# Patient Record
Sex: Female | Born: 1975
Health system: Southern US, Community
[De-identification: ages and names within clinical notes are randomized; demographics above are authoritative.]

## PROBLEM LIST (undated history)

## (undated) DIAGNOSIS — D649 Anemia, unspecified: Secondary | ICD-10-CM

## (undated) DIAGNOSIS — K219 Gastro-esophageal reflux disease without esophagitis: Secondary | ICD-10-CM

## (undated) DIAGNOSIS — E785 Hyperlipidemia, unspecified: Secondary | ICD-10-CM

## (undated) DIAGNOSIS — I10 Essential (primary) hypertension: Secondary | ICD-10-CM

## (undated) DIAGNOSIS — F419 Anxiety disorder, unspecified: Secondary | ICD-10-CM

## (undated) HISTORY — DX: Essential (primary) hypertension: I10

## (undated) HISTORY — DX: Anemia, unspecified: D64.9

## (undated) HISTORY — DX: Anxiety disorder, unspecified: F41.9

## (undated) HISTORY — PX: TUBAL LIGATION: SHX77

## (undated) HISTORY — DX: Gastro-esophageal reflux disease without esophagitis: K21.9

## (undated) HISTORY — PX: APPENDECTOMY: SHX54

## (undated) HISTORY — PX: KIDNEY TRANSPLANT: SHX239

## (undated) HISTORY — PX: CHOLECYSTECTOMY: SHX55

## (undated) HISTORY — DX: Hyperlipidemia, unspecified: E78.5

## (undated) HISTORY — PX: OTHER SURGICAL HISTORY: SHX169

## (undated) HISTORY — PX: VEIN LIGATION: SHX2652

---

## 1998-07-14 ENCOUNTER — Emergency Department (HOSPITAL_COMMUNITY): Admission: EM | Admit: 1998-07-14 | Discharge: 1998-07-14 | Payer: Self-pay | Admitting: Emergency Medicine

## 1998-11-14 ENCOUNTER — Encounter: Payer: Self-pay | Admitting: Emergency Medicine

## 1998-11-14 ENCOUNTER — Emergency Department (HOSPITAL_COMMUNITY): Admission: EM | Admit: 1998-11-14 | Discharge: 1998-11-14 | Payer: Self-pay | Admitting: Emergency Medicine

## 1999-04-05 ENCOUNTER — Emergency Department (HOSPITAL_COMMUNITY): Admission: EM | Admit: 1999-04-05 | Discharge: 1999-04-05 | Payer: Self-pay

## 1999-04-29 ENCOUNTER — Emergency Department (HOSPITAL_COMMUNITY): Admission: EM | Admit: 1999-04-29 | Discharge: 1999-04-29 | Payer: Self-pay | Admitting: *Deleted

## 1999-04-29 ENCOUNTER — Encounter: Payer: Self-pay | Admitting: Emergency Medicine

## 1999-05-10 ENCOUNTER — Encounter: Payer: Self-pay | Admitting: Emergency Medicine

## 1999-05-10 ENCOUNTER — Emergency Department (HOSPITAL_COMMUNITY): Admission: EM | Admit: 1999-05-10 | Discharge: 1999-05-10 | Payer: Self-pay | Admitting: Emergency Medicine

## 1999-10-26 ENCOUNTER — Encounter: Admission: RE | Admit: 1999-10-26 | Discharge: 1999-10-26 | Payer: Self-pay | Admitting: Family Medicine

## 1999-10-26 ENCOUNTER — Encounter: Payer: Self-pay | Admitting: Family Medicine

## 2000-01-23 ENCOUNTER — Emergency Department (HOSPITAL_COMMUNITY): Admission: EM | Admit: 2000-01-23 | Discharge: 2000-01-23 | Payer: Self-pay | Admitting: Emergency Medicine

## 2000-01-23 ENCOUNTER — Encounter: Payer: Self-pay | Admitting: Emergency Medicine

## 2000-02-29 ENCOUNTER — Encounter (INDEPENDENT_AMBULATORY_CARE_PROVIDER_SITE_OTHER): Payer: Self-pay | Admitting: Specialist

## 2000-02-29 ENCOUNTER — Ambulatory Visit (HOSPITAL_COMMUNITY): Admission: RE | Admit: 2000-02-29 | Discharge: 2000-02-29 | Payer: Self-pay | Admitting: Obstetrics and Gynecology

## 2000-05-07 ENCOUNTER — Emergency Department (HOSPITAL_COMMUNITY): Admission: EM | Admit: 2000-05-07 | Discharge: 2000-05-08 | Payer: Self-pay | Admitting: Emergency Medicine

## 2000-05-16 ENCOUNTER — Emergency Department (HOSPITAL_COMMUNITY): Admission: EM | Admit: 2000-05-16 | Discharge: 2000-05-16 | Payer: Self-pay | Admitting: Emergency Medicine

## 2000-06-04 ENCOUNTER — Other Ambulatory Visit: Admission: RE | Admit: 2000-06-04 | Discharge: 2000-06-04 | Payer: Self-pay | Admitting: Obstetrics and Gynecology

## 2000-07-04 ENCOUNTER — Encounter: Payer: Self-pay | Admitting: Emergency Medicine

## 2000-07-04 ENCOUNTER — Emergency Department (HOSPITAL_COMMUNITY): Admission: EM | Admit: 2000-07-04 | Discharge: 2000-07-04 | Payer: Self-pay | Admitting: Emergency Medicine

## 2000-07-28 ENCOUNTER — Ambulatory Visit (HOSPITAL_COMMUNITY): Admission: RE | Admit: 2000-07-28 | Discharge: 2000-07-28 | Payer: Self-pay | Admitting: Obstetrics and Gynecology

## 2000-07-28 ENCOUNTER — Encounter: Payer: Self-pay | Admitting: Obstetrics and Gynecology

## 2000-08-25 ENCOUNTER — Ambulatory Visit (HOSPITAL_COMMUNITY): Admission: RE | Admit: 2000-08-25 | Discharge: 2000-08-25 | Payer: Self-pay | Admitting: Obstetrics and Gynecology

## 2000-08-25 ENCOUNTER — Encounter: Payer: Self-pay | Admitting: Obstetrics and Gynecology

## 2000-08-29 ENCOUNTER — Emergency Department (HOSPITAL_COMMUNITY): Admission: EM | Admit: 2000-08-29 | Discharge: 2000-08-29 | Payer: Self-pay | Admitting: Emergency Medicine

## 2000-10-01 ENCOUNTER — Inpatient Hospital Stay (HOSPITAL_COMMUNITY): Admission: AD | Admit: 2000-10-01 | Discharge: 2000-10-01 | Payer: Self-pay | Admitting: Obstetrics and Gynecology

## 2000-10-01 ENCOUNTER — Encounter: Payer: Self-pay | Admitting: Obstetrics and Gynecology

## 2000-10-01 ENCOUNTER — Inpatient Hospital Stay (HOSPITAL_COMMUNITY): Admission: AD | Admit: 2000-10-01 | Discharge: 2000-10-01 | Payer: Self-pay | Admitting: Obstetrics

## 2000-10-17 ENCOUNTER — Encounter: Payer: Self-pay | Admitting: Obstetrics and Gynecology

## 2000-10-17 ENCOUNTER — Ambulatory Visit (HOSPITAL_COMMUNITY): Admission: RE | Admit: 2000-10-17 | Discharge: 2000-10-17 | Payer: Self-pay | Admitting: Obstetrics and Gynecology

## 2000-10-24 ENCOUNTER — Inpatient Hospital Stay (HOSPITAL_COMMUNITY): Admission: AD | Admit: 2000-10-24 | Discharge: 2000-10-24 | Payer: Self-pay | Admitting: Obstetrics and Gynecology

## 2000-10-28 ENCOUNTER — Inpatient Hospital Stay (HOSPITAL_COMMUNITY): Admission: AD | Admit: 2000-10-28 | Discharge: 2000-10-28 | Payer: Self-pay | Admitting: Obstetrics and Gynecology

## 2000-11-02 ENCOUNTER — Inpatient Hospital Stay (HOSPITAL_COMMUNITY): Admission: AD | Admit: 2000-11-02 | Discharge: 2000-11-07 | Payer: Self-pay | Admitting: Obstetrics and Gynecology

## 2000-11-02 ENCOUNTER — Encounter (INDEPENDENT_AMBULATORY_CARE_PROVIDER_SITE_OTHER): Payer: Self-pay

## 2000-11-03 ENCOUNTER — Encounter: Payer: Self-pay | Admitting: Obstetrics and Gynecology

## 2001-02-07 ENCOUNTER — Emergency Department (HOSPITAL_COMMUNITY): Admission: EM | Admit: 2001-02-07 | Discharge: 2001-02-07 | Payer: Self-pay | Admitting: Emergency Medicine

## 2001-02-25 ENCOUNTER — Encounter: Payer: Self-pay | Admitting: Gastroenterology

## 2001-02-25 ENCOUNTER — Ambulatory Visit (HOSPITAL_COMMUNITY): Admission: RE | Admit: 2001-02-25 | Discharge: 2001-02-25 | Payer: Self-pay | Admitting: Gastroenterology

## 2001-03-24 ENCOUNTER — Observation Stay (HOSPITAL_COMMUNITY): Admission: RE | Admit: 2001-03-24 | Discharge: 2001-03-24 | Payer: Self-pay | Admitting: General Surgery

## 2001-03-24 ENCOUNTER — Encounter: Payer: Self-pay | Admitting: General Surgery

## 2001-03-24 ENCOUNTER — Encounter (INDEPENDENT_AMBULATORY_CARE_PROVIDER_SITE_OTHER): Payer: Self-pay | Admitting: Specialist

## 2001-08-22 ENCOUNTER — Emergency Department (HOSPITAL_COMMUNITY): Admission: EM | Admit: 2001-08-22 | Discharge: 2001-08-22 | Payer: Self-pay | Admitting: Emergency Medicine

## 2001-08-22 ENCOUNTER — Encounter: Payer: Self-pay | Admitting: Emergency Medicine

## 2001-08-28 ENCOUNTER — Emergency Department (HOSPITAL_COMMUNITY): Admission: EM | Admit: 2001-08-28 | Discharge: 2001-08-28 | Payer: Self-pay | Admitting: Emergency Medicine

## 2001-09-04 ENCOUNTER — Ambulatory Visit (HOSPITAL_COMMUNITY): Admission: RE | Admit: 2001-09-04 | Discharge: 2001-09-04 | Payer: Self-pay | Admitting: Specialist

## 2001-09-04 ENCOUNTER — Encounter: Payer: Self-pay | Admitting: Specialist

## 2001-10-26 ENCOUNTER — Emergency Department (HOSPITAL_COMMUNITY): Admission: EM | Admit: 2001-10-26 | Discharge: 2001-10-26 | Payer: Self-pay | Admitting: Emergency Medicine

## 2001-10-26 ENCOUNTER — Encounter: Payer: Self-pay | Admitting: Emergency Medicine

## 2002-05-17 ENCOUNTER — Emergency Department (HOSPITAL_COMMUNITY): Admission: EM | Admit: 2002-05-17 | Discharge: 2002-05-17 | Payer: Self-pay

## 2002-05-21 ENCOUNTER — Ambulatory Visit (HOSPITAL_COMMUNITY): Admission: RE | Admit: 2002-05-21 | Discharge: 2002-05-21 | Payer: Self-pay | Admitting: Obstetrics and Gynecology

## 2002-05-21 ENCOUNTER — Encounter: Payer: Self-pay | Admitting: Obstetrics and Gynecology

## 2003-04-06 ENCOUNTER — Emergency Department (HOSPITAL_COMMUNITY): Admission: EM | Admit: 2003-04-06 | Discharge: 2003-04-06 | Payer: Self-pay | Admitting: Emergency Medicine

## 2003-12-17 ENCOUNTER — Emergency Department (HOSPITAL_COMMUNITY): Admission: EM | Admit: 2003-12-17 | Discharge: 2003-12-18 | Payer: Self-pay | Admitting: Emergency Medicine

## 2003-12-21 ENCOUNTER — Ambulatory Visit (HOSPITAL_COMMUNITY): Admission: RE | Admit: 2003-12-21 | Discharge: 2003-12-21 | Payer: Self-pay | Admitting: Obstetrics and Gynecology

## 2004-02-01 ENCOUNTER — Emergency Department (HOSPITAL_COMMUNITY): Admission: EM | Admit: 2004-02-01 | Discharge: 2004-02-01 | Payer: Self-pay | Admitting: Emergency Medicine

## 2004-03-05 ENCOUNTER — Encounter: Admission: RE | Admit: 2004-03-05 | Discharge: 2004-03-05 | Payer: Self-pay | Admitting: *Deleted

## 2004-08-17 ENCOUNTER — Emergency Department (HOSPITAL_COMMUNITY): Admission: EM | Admit: 2004-08-17 | Discharge: 2004-08-17 | Payer: Self-pay | Admitting: Emergency Medicine

## 2004-09-14 ENCOUNTER — Encounter: Admission: RE | Admit: 2004-09-14 | Discharge: 2004-12-13 | Payer: Self-pay | Admitting: Orthopedic Surgery

## 2005-12-04 ENCOUNTER — Emergency Department (HOSPITAL_COMMUNITY): Admission: EM | Admit: 2005-12-04 | Discharge: 2005-12-04 | Payer: Self-pay | Admitting: Emergency Medicine

## 2006-01-09 ENCOUNTER — Emergency Department (HOSPITAL_COMMUNITY): Admission: EM | Admit: 2006-01-09 | Discharge: 2006-01-09 | Payer: Self-pay | Admitting: Emergency Medicine

## 2006-03-14 ENCOUNTER — Emergency Department (HOSPITAL_COMMUNITY): Admission: EM | Admit: 2006-03-14 | Discharge: 2006-03-14 | Payer: Self-pay | Admitting: Emergency Medicine

## 2006-03-19 ENCOUNTER — Inpatient Hospital Stay (HOSPITAL_COMMUNITY): Admission: AD | Admit: 2006-03-19 | Discharge: 2006-03-20 | Payer: Self-pay | Admitting: Obstetrics and Gynecology

## 2006-04-16 ENCOUNTER — Inpatient Hospital Stay (HOSPITAL_COMMUNITY): Admission: AD | Admit: 2006-04-16 | Discharge: 2006-04-19 | Payer: Self-pay | Admitting: Obstetrics and Gynecology

## 2006-09-08 ENCOUNTER — Encounter: Admission: RE | Admit: 2006-09-08 | Discharge: 2006-09-08 | Payer: Self-pay | Admitting: Nephrology

## 2006-09-20 ENCOUNTER — Encounter (INDEPENDENT_AMBULATORY_CARE_PROVIDER_SITE_OTHER): Payer: Self-pay | Admitting: *Deleted

## 2006-09-20 ENCOUNTER — Encounter: Payer: Self-pay | Admitting: Emergency Medicine

## 2006-09-20 ENCOUNTER — Inpatient Hospital Stay (HOSPITAL_COMMUNITY): Admission: AD | Admit: 2006-09-20 | Discharge: 2006-09-20 | Payer: Self-pay | Admitting: Obstetrics and Gynecology

## 2006-09-21 ENCOUNTER — Inpatient Hospital Stay (HOSPITAL_COMMUNITY): Admission: RE | Admit: 2006-09-21 | Discharge: 2006-09-22 | Payer: Self-pay | Admitting: Surgery

## 2006-11-18 ENCOUNTER — Ambulatory Visit (HOSPITAL_COMMUNITY): Admission: RE | Admit: 2006-11-18 | Discharge: 2006-11-18 | Payer: Self-pay | Admitting: Family Medicine

## 2006-11-19 ENCOUNTER — Ambulatory Visit: Payer: Self-pay | Admitting: Gynecology

## 2006-11-28 ENCOUNTER — Ambulatory Visit (HOSPITAL_COMMUNITY): Admission: RE | Admit: 2006-11-28 | Discharge: 2006-11-28 | Payer: Self-pay | Admitting: Gynecology

## 2006-11-28 ENCOUNTER — Ambulatory Visit: Payer: Self-pay | Admitting: Gynecology

## 2006-11-28 ENCOUNTER — Encounter (INDEPENDENT_AMBULATORY_CARE_PROVIDER_SITE_OTHER): Payer: Self-pay | Admitting: Specialist

## 2007-02-26 ENCOUNTER — Ambulatory Visit: Payer: Self-pay | Admitting: *Deleted

## 2007-03-06 ENCOUNTER — Ambulatory Visit: Payer: Self-pay | Admitting: *Deleted

## 2007-03-06 ENCOUNTER — Ambulatory Visit (HOSPITAL_COMMUNITY): Admission: RE | Admit: 2007-03-06 | Discharge: 2007-03-06 | Payer: Self-pay | Admitting: *Deleted

## 2007-03-08 ENCOUNTER — Emergency Department (HOSPITAL_COMMUNITY): Admission: EM | Admit: 2007-03-08 | Discharge: 2007-03-08 | Payer: Self-pay | Admitting: Emergency Medicine

## 2007-03-19 ENCOUNTER — Ambulatory Visit: Payer: Self-pay | Admitting: *Deleted

## 2007-04-09 ENCOUNTER — Ambulatory Visit: Payer: Self-pay | Admitting: *Deleted

## 2007-04-23 ENCOUNTER — Emergency Department (HOSPITAL_COMMUNITY): Admission: EM | Admit: 2007-04-23 | Discharge: 2007-04-23 | Payer: Self-pay | Admitting: Emergency Medicine

## 2007-05-08 ENCOUNTER — Ambulatory Visit (HOSPITAL_COMMUNITY): Admission: RE | Admit: 2007-05-08 | Discharge: 2007-05-08 | Payer: Self-pay | Admitting: General Practice

## 2007-06-26 ENCOUNTER — Ambulatory Visit (HOSPITAL_COMMUNITY): Admission: RE | Admit: 2007-06-26 | Discharge: 2007-06-26 | Payer: Self-pay | Admitting: *Deleted

## 2007-06-26 ENCOUNTER — Ambulatory Visit: Payer: Self-pay | Admitting: *Deleted

## 2007-07-06 ENCOUNTER — Emergency Department (HOSPITAL_COMMUNITY): Admission: EM | Admit: 2007-07-06 | Discharge: 2007-07-06 | Payer: Self-pay | Admitting: Emergency Medicine

## 2007-07-30 ENCOUNTER — Ambulatory Visit: Payer: Self-pay | Admitting: *Deleted

## 2007-08-07 ENCOUNTER — Ambulatory Visit: Payer: Self-pay | Admitting: Vascular Surgery

## 2007-08-07 ENCOUNTER — Ambulatory Visit (HOSPITAL_COMMUNITY): Admission: RE | Admit: 2007-08-07 | Discharge: 2007-08-07 | Payer: Self-pay | Admitting: Vascular Surgery

## 2007-09-01 ENCOUNTER — Emergency Department (HOSPITAL_COMMUNITY): Admission: EM | Admit: 2007-09-01 | Discharge: 2007-09-02 | Payer: Self-pay | Admitting: Emergency Medicine

## 2007-10-01 ENCOUNTER — Ambulatory Visit: Payer: Self-pay | Admitting: *Deleted

## 2007-10-05 ENCOUNTER — Encounter: Admission: RE | Admit: 2007-10-05 | Discharge: 2007-10-05 | Payer: Self-pay | Admitting: Family Medicine

## 2007-10-06 ENCOUNTER — Encounter (INDEPENDENT_AMBULATORY_CARE_PROVIDER_SITE_OTHER): Payer: Self-pay | Admitting: *Deleted

## 2007-10-06 ENCOUNTER — Ambulatory Visit: Payer: Self-pay | Admitting: *Deleted

## 2007-10-06 ENCOUNTER — Inpatient Hospital Stay (HOSPITAL_COMMUNITY): Admission: AD | Admit: 2007-10-06 | Discharge: 2007-10-10 | Payer: Self-pay | Admitting: *Deleted

## 2007-10-14 ENCOUNTER — Ambulatory Visit: Payer: Self-pay | Admitting: Vascular Surgery

## 2007-10-23 ENCOUNTER — Ambulatory Visit: Payer: Self-pay | Admitting: Vascular Surgery

## 2007-11-12 ENCOUNTER — Ambulatory Visit: Payer: Self-pay | Admitting: *Deleted

## 2008-02-24 ENCOUNTER — Emergency Department (HOSPITAL_COMMUNITY): Admission: EM | Admit: 2008-02-24 | Discharge: 2008-02-24 | Payer: Self-pay | Admitting: Emergency Medicine

## 2008-03-02 ENCOUNTER — Ambulatory Visit (HOSPITAL_COMMUNITY): Admission: RE | Admit: 2008-03-02 | Discharge: 2008-03-02 | Payer: Self-pay | Admitting: Surgery

## 2008-03-02 ENCOUNTER — Ambulatory Visit: Payer: Self-pay | Admitting: Surgery

## 2008-03-07 ENCOUNTER — Emergency Department (HOSPITAL_COMMUNITY): Admission: EM | Admit: 2008-03-07 | Discharge: 2008-03-07 | Payer: Self-pay | Admitting: Emergency Medicine

## 2008-04-06 ENCOUNTER — Inpatient Hospital Stay (HOSPITAL_COMMUNITY): Admission: EM | Admit: 2008-04-06 | Discharge: 2008-04-09 | Payer: Self-pay | Admitting: Emergency Medicine

## 2008-04-11 ENCOUNTER — Ambulatory Visit (HOSPITAL_COMMUNITY): Admission: RE | Admit: 2008-04-11 | Discharge: 2008-04-11 | Payer: Self-pay | Admitting: Nephrology

## 2008-04-28 ENCOUNTER — Ambulatory Visit: Payer: Self-pay | Admitting: *Deleted

## 2008-05-12 ENCOUNTER — Ambulatory Visit: Payer: Self-pay | Admitting: *Deleted

## 2008-05-14 ENCOUNTER — Emergency Department (HOSPITAL_COMMUNITY): Admission: EM | Admit: 2008-05-14 | Discharge: 2008-05-14 | Payer: Self-pay | Admitting: Emergency Medicine

## 2008-05-16 ENCOUNTER — Ambulatory Visit (HOSPITAL_COMMUNITY): Admission: RE | Admit: 2008-05-16 | Discharge: 2008-05-16 | Payer: Self-pay | Admitting: Nephrology

## 2008-05-19 ENCOUNTER — Encounter: Payer: Self-pay | Admitting: *Deleted

## 2008-05-19 ENCOUNTER — Ambulatory Visit (HOSPITAL_COMMUNITY): Admission: RE | Admit: 2008-05-19 | Discharge: 2008-05-19 | Payer: Self-pay | Admitting: Vascular Surgery

## 2008-05-19 ENCOUNTER — Ambulatory Visit: Payer: Self-pay | Admitting: Vascular Surgery

## 2008-05-24 ENCOUNTER — Emergency Department (HOSPITAL_COMMUNITY): Admission: EM | Admit: 2008-05-24 | Discharge: 2008-05-25 | Payer: Self-pay | Admitting: Emergency Medicine

## 2008-05-27 ENCOUNTER — Ambulatory Visit: Payer: Self-pay | Admitting: Internal Medicine

## 2008-05-31 ENCOUNTER — Ambulatory Visit (HOSPITAL_COMMUNITY): Admission: RE | Admit: 2008-05-31 | Discharge: 2008-05-31 | Payer: Self-pay | Admitting: *Deleted

## 2008-05-31 ENCOUNTER — Telehealth (INDEPENDENT_AMBULATORY_CARE_PROVIDER_SITE_OTHER): Payer: Self-pay | Admitting: *Deleted

## 2008-06-05 ENCOUNTER — Emergency Department (HOSPITAL_COMMUNITY): Admission: EM | Admit: 2008-06-05 | Discharge: 2008-06-06 | Payer: Self-pay | Admitting: Emergency Medicine

## 2008-07-04 ENCOUNTER — Ambulatory Visit: Payer: Self-pay | Admitting: *Deleted

## 2008-07-28 ENCOUNTER — Ambulatory Visit: Payer: Self-pay | Admitting: Vascular Surgery

## 2008-07-28 ENCOUNTER — Inpatient Hospital Stay (HOSPITAL_COMMUNITY): Admission: EM | Admit: 2008-07-28 | Discharge: 2008-07-30 | Payer: Self-pay | Admitting: Emergency Medicine

## 2008-10-25 ENCOUNTER — Ambulatory Visit: Payer: Self-pay | Admitting: Vascular Surgery

## 2008-10-25 ENCOUNTER — Ambulatory Visit (HOSPITAL_COMMUNITY): Admission: RE | Admit: 2008-10-25 | Discharge: 2008-10-25 | Payer: Self-pay | Admitting: Vascular Surgery

## 2008-10-25 ENCOUNTER — Other Ambulatory Visit: Payer: Self-pay | Admitting: Vascular Surgery

## 2008-10-27 ENCOUNTER — Ambulatory Visit (HOSPITAL_COMMUNITY): Admission: RE | Admit: 2008-10-27 | Discharge: 2008-10-27 | Payer: Self-pay | Admitting: Vascular Surgery

## 2008-11-02 ENCOUNTER — Inpatient Hospital Stay (HOSPITAL_COMMUNITY): Admission: AD | Admit: 2008-11-02 | Discharge: 2008-11-02 | Payer: Self-pay | Admitting: Nephrology

## 2008-11-28 ENCOUNTER — Inpatient Hospital Stay (HOSPITAL_COMMUNITY): Admission: EM | Admit: 2008-11-28 | Discharge: 2008-12-02 | Payer: Self-pay | Admitting: Emergency Medicine

## 2008-11-28 ENCOUNTER — Ambulatory Visit: Payer: Self-pay | Admitting: Cardiology

## 2008-11-29 ENCOUNTER — Ambulatory Visit: Payer: Self-pay | Admitting: Vascular Surgery

## 2008-11-30 ENCOUNTER — Encounter (INDEPENDENT_AMBULATORY_CARE_PROVIDER_SITE_OTHER): Payer: Self-pay | Admitting: Internal Medicine

## 2008-12-02 ENCOUNTER — Encounter: Payer: Self-pay | Admitting: Cardiology

## 2009-03-24 ENCOUNTER — Emergency Department (HOSPITAL_COMMUNITY): Admission: EM | Admit: 2009-03-24 | Discharge: 2009-03-24 | Payer: Self-pay | Admitting: Emergency Medicine

## 2009-04-01 ENCOUNTER — Emergency Department (HOSPITAL_COMMUNITY): Admission: EM | Admit: 2009-04-01 | Discharge: 2009-04-01 | Payer: Self-pay | Admitting: Emergency Medicine

## 2009-10-08 IMAGING — CT CT ABDOMEN W/O CM
2 of 4 series · 17 of 46 positions shown, 19 images · IV contrast (agent unspecified)
Comparison: 09/20/06

CLINICAL DATA: 31 year-old with left flank pain.  
 ABDOMEN CT WITHOUT CONTRAST:
TECHNIQUE: Multidetector CT imaging of the abdomen was performed following the standard protocol without IV contrast.
TECHNIQUE: Multidetector CT imaging of the pelvis was performed following the standard protocol without IV contrast.

[Series 2: 130 stone 5.0 b40f st · axial · 0.61mm/px · z∈[+1236,+1556]mm · 14 of 70 slices shown, 16 images]
[im 3/70  soft-tissue]
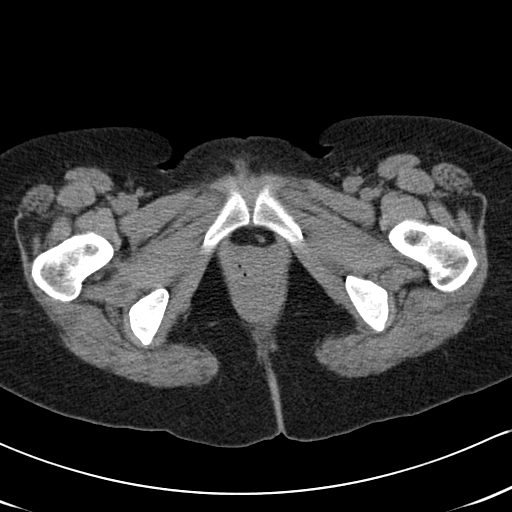
[im 3/70  bone]
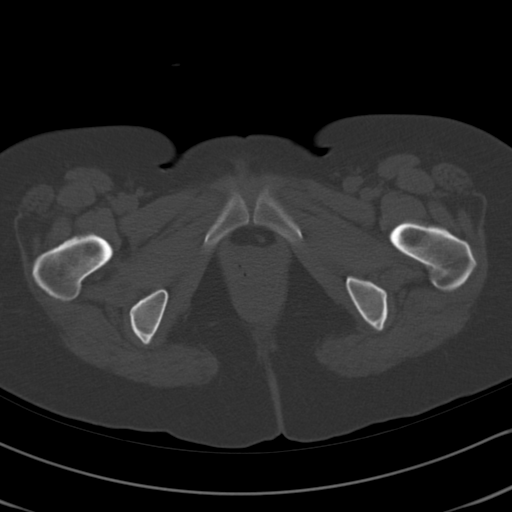
[im 8/70  soft-tissue]
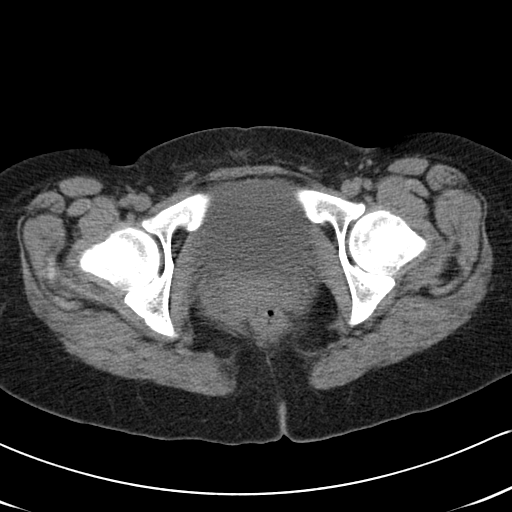
[im 13/70  soft-tissue]
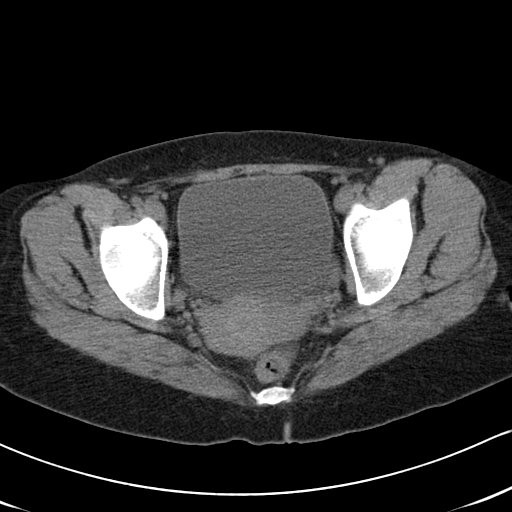
[im 18/70  soft-tissue]
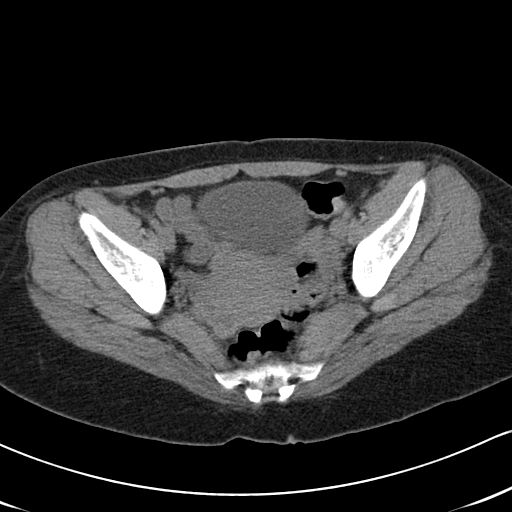
[im 24/70  soft-tissue]
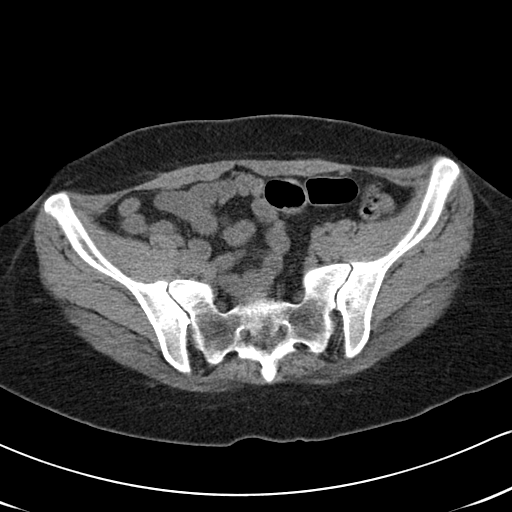
[im 29/70  soft-tissue]
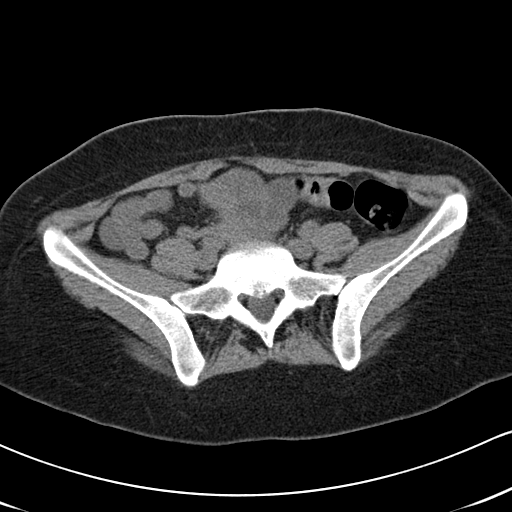
[im 34/70  soft-tissue]
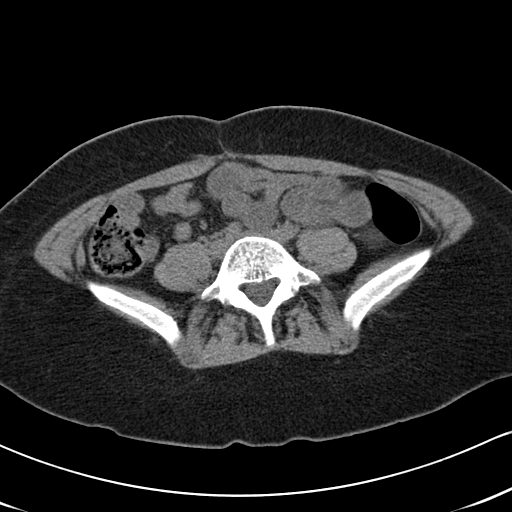
[im 36/70  soft-tissue]
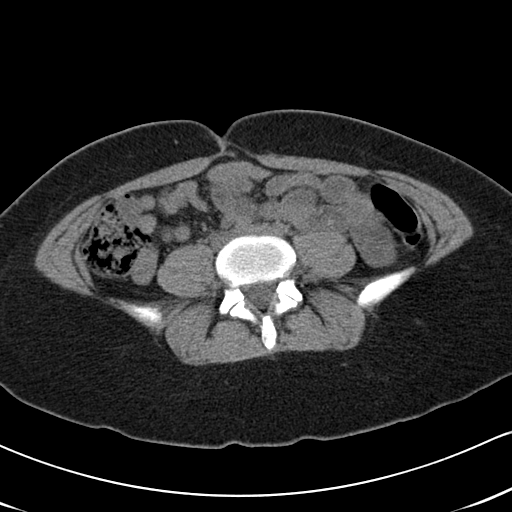
[im 41/70  soft-tissue]
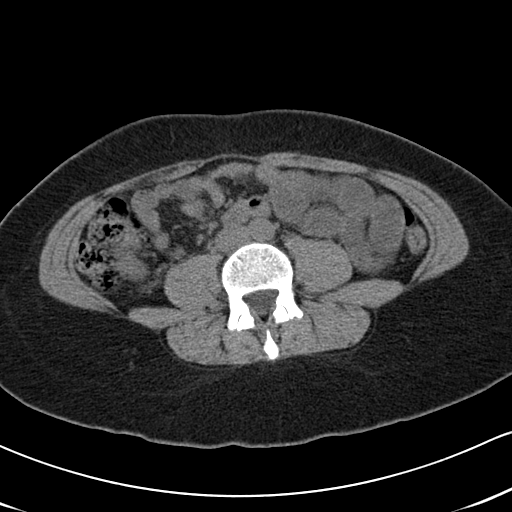
[im 41/70  bone]
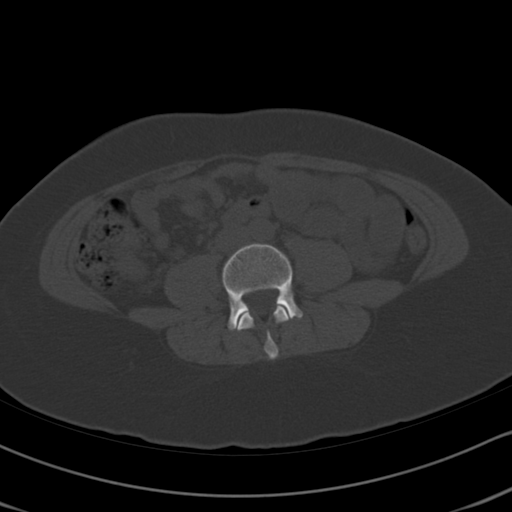
[im 47/70  soft-tissue]
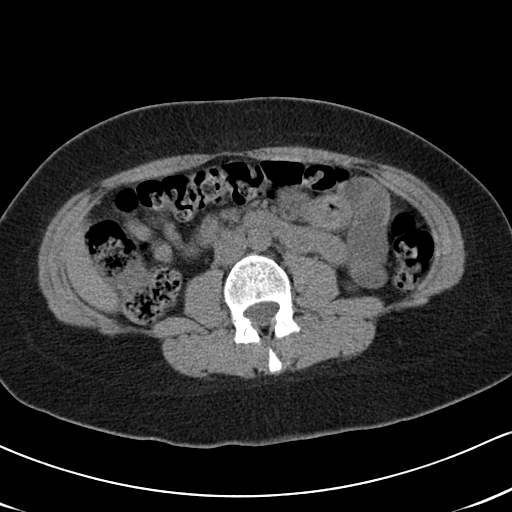
[im 52/70  soft-tissue]
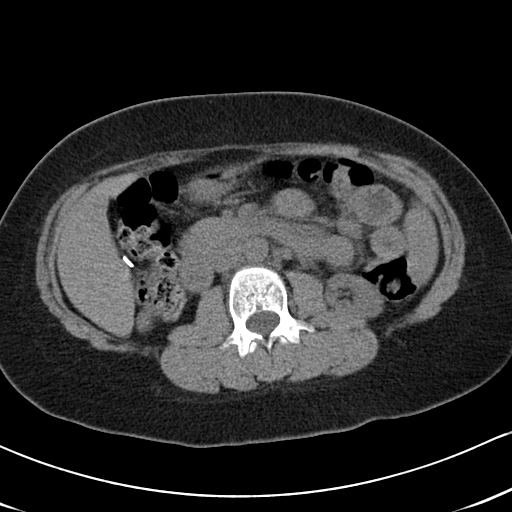
[im 57/70  soft-tissue]
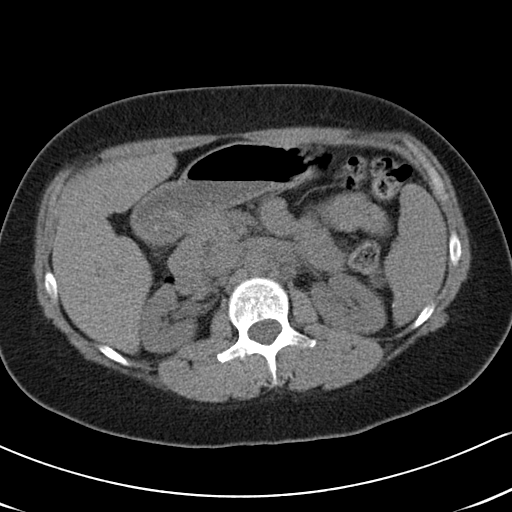
[im 62/70  soft-tissue]
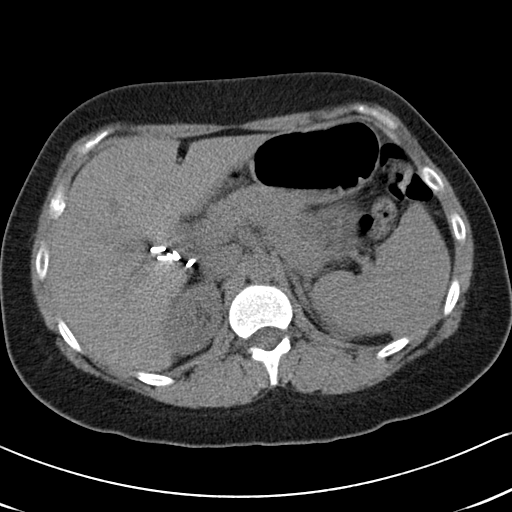
[im 67/70  soft-tissue]
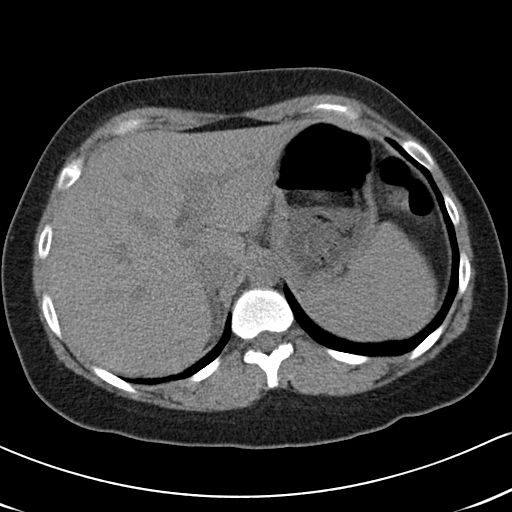

[Series 602: <mpr thick range> · coronal · 0.72mm/px · 3 of 57 slices shown]
[im 19/57  soft-tissue]
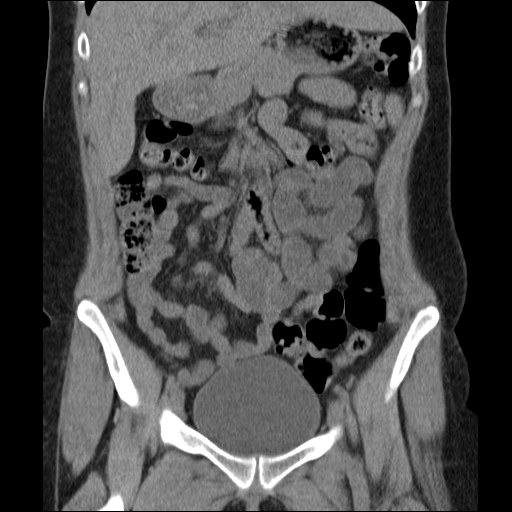
[im 25/57  soft-tissue]
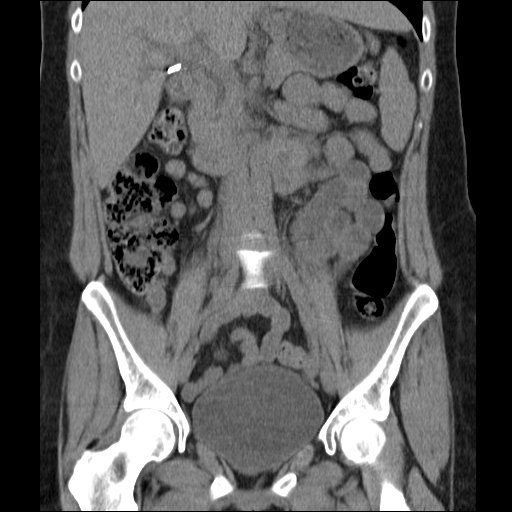
[im 32/57  soft-tissue]
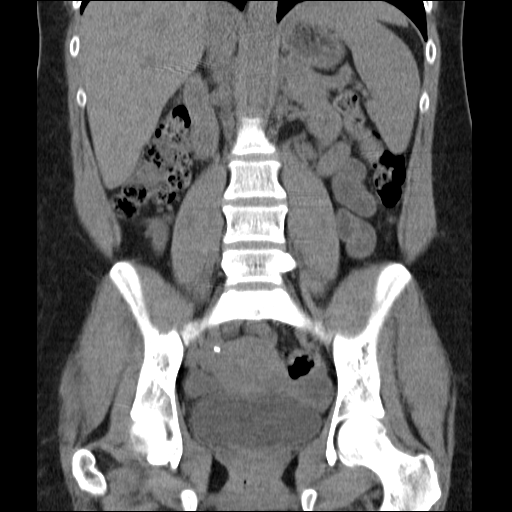

[17 of 46 positions shown; findings below may reference images not displayed]

FINDINGS: The lung bases are clear.
 The visualized unenhanced appearance of the liver and spleen are unremarkable.  Patient has had a cholecystectomy.  The pancreas is grossly normal.  The adrenal glands and kidneys demonstrate no significant findings.  Both kidneys are small which is a stable finding.  No renal calculi or renal obstructive process.  
 The aorta is normal in caliber.
 The stomach, duodenum, small bowel and colon are grossly normal but the study is limited without oral contrast.
 Small scattered mesenteric and retroperitoneal lymph nodes but no adenopathy.  
 No significant bony findings.
IMPRESSION: 1.  Small kidneys, stable.
 2.  Status post cholecystectomy.
 3.  No renal calculi or evidence for renal obstructive process.  
 4.  No acute abdominal findings, masses or adenopathy.  Small scattered mesenteric and retroperitoneal lymph nodes.
 PELVIS CT WITHOUT CONTRAST:
FINDINGS: Surgical clips are noted from a previous appendectomy.  There are also tubal ligation clips.  There is a small cyst associated with the left ovary.  The right ovary appears normal.  The rectum, sigmoid colon and visualized small bowel loops are unremarkable.  No ureteral calculi or bladder calculi.  No inguinal masses or adenopathy.  The bony pelvis is intact.
IMPRESSION: 1.  No acute pelvic findings, masses or adenopathy. 
 2.  Status post appendectomy.  
 3.  Simple-appearing cyst associated with the left ovary.

## 2009-10-13 ENCOUNTER — Emergency Department (HOSPITAL_COMMUNITY): Admission: EM | Admit: 2009-10-13 | Discharge: 2009-10-14 | Payer: Self-pay | Admitting: Emergency Medicine

## 2009-10-18 ENCOUNTER — Inpatient Hospital Stay (HOSPITAL_COMMUNITY): Admission: EM | Admit: 2009-10-18 | Discharge: 2009-10-25 | Payer: Self-pay | Admitting: Emergency Medicine

## 2009-10-19 ENCOUNTER — Ambulatory Visit: Payer: Self-pay | Admitting: Internal Medicine

## 2009-10-30 ENCOUNTER — Ambulatory Visit: Payer: Self-pay | Admitting: Infectious Disease

## 2009-10-30 DIAGNOSIS — I10 Essential (primary) hypertension: Secondary | ICD-10-CM | POA: Insufficient documentation

## 2009-10-30 DIAGNOSIS — R7881 Bacteremia: Secondary | ICD-10-CM | POA: Insufficient documentation

## 2009-10-30 DIAGNOSIS — L03211 Cellulitis of face: Secondary | ICD-10-CM

## 2009-10-30 DIAGNOSIS — Z940 Kidney transplant status: Secondary | ICD-10-CM | POA: Insufficient documentation

## 2009-10-30 DIAGNOSIS — A4102 Sepsis due to Methicillin resistant Staphylococcus aureus: Secondary | ICD-10-CM | POA: Insufficient documentation

## 2009-10-30 DIAGNOSIS — L0201 Cutaneous abscess of face: Secondary | ICD-10-CM | POA: Insufficient documentation

## 2009-10-30 DIAGNOSIS — A4901 Methicillin susceptible Staphylococcus aureus infection, unspecified site: Secondary | ICD-10-CM | POA: Insufficient documentation

## 2009-10-30 LAB — CONVERTED CEMR LAB
BUN: 32 mg/dL — ABNORMAL HIGH (ref 6–23)
Basophils Absolute: 0 10*3/uL (ref 0.0–0.1)
Basophils Relative: 0 % (ref 0–1)
CO2: 19 meq/L (ref 19–32)
CRP: 0.3 mg/dL (ref <0.6)
Calcium: 9.9 mg/dL (ref 8.4–10.5)
Chloride: 105 meq/L (ref 96–112)
Creatinine, Ser: 1.6 mg/dL — ABNORMAL HIGH (ref 0.40–1.20)
Eosinophils Absolute: 0.1 10*3/uL (ref 0.0–0.7)
Eosinophils Relative: 1 % (ref 0–5)
Glucose, Bld: 84 mg/dL (ref 70–99)
HCT: 46.4 % — ABNORMAL HIGH (ref 36.0–46.0)
Hemoglobin: 15.4 g/dL — ABNORMAL HIGH (ref 12.0–15.0)
Lymphocytes Relative: 10 % — ABNORMAL LOW (ref 12–46)
Lymphs Abs: 1 10*3/uL (ref 0.7–4.0)
MCHC: 33.2 g/dL (ref 30.0–36.0)
MCV: 85.8 fL (ref >=78.0)
Monocytes Absolute: 0.5 10*3/uL (ref 0.1–1.0)
Monocytes Relative: 5 % (ref 3–12)
Neutro Abs: 8.4 10*3/uL — ABNORMAL HIGH (ref 1.7–7.7)
Neutrophils Relative %: 84 % — ABNORMAL HIGH (ref 43–77)
Platelets: 244 10*3/uL (ref 150–400)
Potassium: 4.6 meq/L (ref 3.5–5.3)
RBC: 5.41 M/uL — ABNORMAL HIGH (ref 3.87–5.11)
RDW: 12.9 % (ref 11.5–15.5)
Sed Rate: 10 mm/hr (ref 0–22)
Sodium: 138 meq/L (ref 135–145)
WBC: 10 10*3/uL (ref 4.0–10.5)

## 2009-11-05 ENCOUNTER — Inpatient Hospital Stay (HOSPITAL_COMMUNITY): Admission: AD | Admit: 2009-11-05 | Discharge: 2009-11-07 | Payer: Self-pay | Admitting: Internal Medicine

## 2009-11-07 ENCOUNTER — Telehealth: Payer: Self-pay | Admitting: Infectious Disease

## 2009-11-07 ENCOUNTER — Ambulatory Visit: Payer: Self-pay | Admitting: Infectious Disease

## 2009-11-09 ENCOUNTER — Encounter: Payer: Self-pay | Admitting: Infectious Disease

## 2009-11-09 ENCOUNTER — Telehealth: Payer: Self-pay | Admitting: Infectious Diseases

## 2009-11-13 ENCOUNTER — Telehealth: Payer: Self-pay | Admitting: Infectious Disease

## 2009-11-17 ENCOUNTER — Encounter: Payer: Self-pay | Admitting: Infectious Disease

## 2009-11-20 ENCOUNTER — Ambulatory Visit: Payer: Self-pay | Admitting: Infectious Disease

## 2009-11-20 DIAGNOSIS — A4902 Methicillin resistant Staphylococcus aureus infection, unspecified site: Secondary | ICD-10-CM | POA: Insufficient documentation

## 2009-11-21 ENCOUNTER — Encounter: Payer: Self-pay | Admitting: Infectious Disease

## 2009-11-30 ENCOUNTER — Encounter: Payer: Self-pay | Admitting: Infectious Disease

## 2009-12-04 ENCOUNTER — Telehealth: Payer: Self-pay | Admitting: Infectious Disease

## 2009-12-08 ENCOUNTER — Encounter: Payer: Self-pay | Admitting: Infectious Disease

## 2009-12-11 ENCOUNTER — Encounter: Payer: Self-pay | Admitting: Infectious Disease

## 2009-12-18 ENCOUNTER — Ambulatory Visit: Payer: Self-pay | Admitting: Infectious Disease

## 2009-12-20 ENCOUNTER — Encounter: Payer: Self-pay | Admitting: Infectious Disease

## 2009-12-21 ENCOUNTER — Inpatient Hospital Stay (HOSPITAL_COMMUNITY): Admission: AD | Admit: 2009-12-21 | Discharge: 2009-12-23 | Payer: Self-pay | Admitting: Internal Medicine

## 2009-12-21 ENCOUNTER — Encounter (INDEPENDENT_AMBULATORY_CARE_PROVIDER_SITE_OTHER): Payer: Self-pay | Admitting: Dermatology

## 2009-12-21 ENCOUNTER — Ambulatory Visit: Payer: Self-pay | Admitting: Internal Medicine

## 2009-12-22 ENCOUNTER — Ambulatory Visit: Payer: Self-pay | Admitting: Infectious Diseases

## 2009-12-23 ENCOUNTER — Encounter: Payer: Self-pay | Admitting: Internal Medicine

## 2009-12-28 ENCOUNTER — Encounter: Payer: Self-pay | Admitting: Infectious Disease

## 2009-12-29 ENCOUNTER — Telehealth: Payer: Self-pay | Admitting: Infectious Disease

## 2009-12-29 ENCOUNTER — Encounter: Payer: Self-pay | Admitting: Infectious Disease

## 2010-01-02 ENCOUNTER — Telehealth: Payer: Self-pay | Admitting: Infectious Disease

## 2010-01-04 ENCOUNTER — Ambulatory Visit: Payer: Self-pay | Admitting: Infectious Disease

## 2010-01-11 ENCOUNTER — Encounter: Payer: Self-pay | Admitting: Infectious Disease

## 2010-01-15 ENCOUNTER — Encounter: Payer: Self-pay | Admitting: Infectious Disease

## 2010-02-15 ENCOUNTER — Encounter: Payer: Self-pay | Admitting: Infectious Disease

## 2010-03-07 ENCOUNTER — Encounter: Payer: Self-pay | Admitting: Infectious Disease

## 2010-04-04 ENCOUNTER — Telehealth: Payer: Self-pay | Admitting: Infectious Disease

## 2010-04-17 ENCOUNTER — Encounter: Payer: Self-pay | Admitting: Infectious Disease

## 2010-05-29 ENCOUNTER — Emergency Department: Payer: Self-pay | Admitting: Emergency Medicine

## 2010-06-27 ENCOUNTER — Telehealth: Payer: Self-pay | Admitting: Internal Medicine

## 2010-07-25 ENCOUNTER — Emergency Department: Payer: Self-pay | Admitting: Emergency Medicine

## 2010-09-02 ENCOUNTER — Encounter: Payer: Self-pay | Admitting: Nephrology

## 2010-09-03 ENCOUNTER — Encounter: Payer: Self-pay | Admitting: Nephrology

## 2010-09-13 NOTE — Consult Note (Signed)
Summary: [facility]  [facility]   Imported By: Florinda Marker 12/13/2009 15:57:27  _____________________________________________________________________  External Attachment:    Type:   Image     Comment:   External Document

## 2010-09-13 NOTE — Miscellaneous (Signed)
Summary: Hospital Admission H&P  INTERNAL MEDICINE ADMISSION HISTORY AND PHYSICAL  PCP: Acey Lav, MD  CC: facial abscess  HPI:  35 year old lady with hx of ESRD and kidney transplant last spring who was [facility] with a left facial abscess and underwent I and D by Dr. Arn Medal from ENT March 2011. Cultures grew MSSA (R to clindamcyin though) and she was transitioned from inital daptomycin to ancef adn then sent hom on keflex 500mg  qid. Her facial edema has apparently markedly improved compared to when she was admitted to the hospital and seen by Dr. Arn Medal and my partner Dr. Orvan Falconer. She failed to improve on oral antibiotics and was readmitted by me (moonlighting for [employer]) and underwent repeat I an D by ENT and was sent home on ancef, but this time actually grew MRSA from deep cultures S to vanco, tetra, TMP clinda (the MSSA was clinda R , tetra S, tmp s, ox S) and was switched to daptomycin. She improved on the daptomycin and actually received greater than 3 weeks of IV daptomycin at 4mg /kg. However there remained an area of exquisite tenderness on the nasal aspect of her face that is also fluctuant. She had called on the 25th when her cubicin was runnign out and was concerned about the persistent area of fluctuance. I called her back (contrary to what is documented in the notes from [facility] subsequent to then when she apparently claimed she was not called back) and informed her that if she still had an area of fluctuance she would need repeat I and D and that protracted daptomycin would not solve the problem. I put her on oral bactri at two tablets twice daily. I have also refilled her narcotic script. Today she had been urgently scheduled to see Dr. Sampson Goon in the afternoon but came in the morning and I worked her in as a different patient had cancelled. She continues to have  significant pain in her face and orbit and is taking 1.5 percocet 10s three times a day now. She  denies subjective fevers but is still suffering.  She stats taht Dr. Pollyann Kennedy performed the most recent surgery.   **Above is taken from office note of Dr Daiva Eves from 12-18-2009  Patient is here today for admission. Other than facial pain at left cheek and left side of nose, she has no complaints, specifically no fevers or chills.    ALLERGIES: vancomycin [itching, erythema] metoclopramide [anxiety]  PAST MEDICAL HISTORY: Renal transplant, 1 year ago with donor kidney from mother; transplant at [facility]; nephrologist in [place] is Dr. Eliott Nine HTN MSSA and MRSA facial absess [see HPI]   MEDICATIONS:   Current Meds:  MYFORTIC 360 MG TBEC (MYCOPHENOLATE SODIUM) 2 tablets two times a day PROGRAF 1 MG CAPS (TACROLIMUS) 3 capsules in the am and 3 capsules in the pm PREDNISONE 10 MG TABS (PREDNISONE) 2 tabs each morning AMLODIPINE BESYLATE 5 MG TABS (AMLODIPINE BESYLATE)  ALPRAZOLAM 0.5 MG TABS (ALPRAZOLAM) Take 1 tablet by mouth twice a day PERCOCET 10-325 MG TABS (OXYCODONE-ACETAMINOPHEN) take one and one and half tablets three times a day BACTRIM DS 800-160 MG TABS (SULFAMETHOXAZOLE-TRIMETHOPRIM) Take 1 tablet by mouth two times a day until seen by Dr. Daiva Eves ZOLPIDEM TARTRATE 10 MG TABS (ZOLPIDEM TARTRATE) Take 1 tablet by mouth each evening as needed for insomnia     SOCIAL HISTORY: Social History: lives in [place] with her son. Has 14 kids [70 year old twins and a 13 year  old son]. Smokes 3/4 PPD. No ETOH. No other illicit drugs. Is disabled. Has medication coverage at $3 or less. Substance use: Insurance: Medicare  FAMILY HISTORY non contributory; no known renal disease  ROS: General: Denies fever, chills, weight loss/gain, fatigue, recent travel, sick contacts, diaphoresis, night sweats HEENT: Denies earache, nosebleed, sore throat; has 8/10 pain over left cheek at site of infection CV: Denies chest pain, palpitations, dyspnea, orthopnea, PND, edema, syncope Resp: Denies  dyspnea, DOE, cough, sputum, hemoptysis, wheezing GI: Denies nausea, vomiting, diarrhea, constipation, hematemesis, hematochezia, melena  GU: Denies dysuria, urinary frequency, flank pain, hematuria, urinary incontinence, nocturia, urethral discharge, vaginal discharge Derm: Denies rash, hair loss Endocrine: Denies heat intolerance, cold intolerance, polyuria, polydipsia Heme/Lymph: Denies bruising, lymphadenopathy MS: Denies joint pain, joint swelling, recent trauma, muscle pain, back pain  Neuro/Eyes: Denies weakness, numbness, tremor, headache, diplopia, blurry vision, vision change, hearing loss, dizziness, tinnitus, imbalance, vertigo, neck stiffness Psych: Denies depression, anxiety, suicidal ideations, homicidal ideations, hallucinations   VITALS: T: 98.4 P: 80 BP: 132/102 R 20 SAT:94%  on RA  PHYSICAL EXAM: General:  alert, well-developed, and cooperative to examination.   Head:  normocephalic and atraumatic.  Patient has erythema on the left cheek extending onto the left nose. Left maxillary area is very TTP and a fluctuant mass can be palpated. Eyes:  vision grossly intact, pupils equal, pupils round, pupils reactive to light, no injection and anicteric.   Mouth:  pharynx pink and moist, no erythema, and no exudates.   Neck:  supple, full ROM, no thyromegaly, no JVD, and no carotid bruits.   Lungs:  normal respiratory effort, no accessory muscle use, normal breath sounds, no crackles, and no wheezes.  Heart:  normal rate, regular rhythm, no murmur, no gallop, and no rub.   Abdomen:  soft, non-tender, normal bowel sounds, no distention, no guarding, no rebound tenderness, no hepatomegaly, and no splenomegaly.   Msk:  no joint swelling, no joint warmth, and no redness over joints.   Pulses:  2+ DP/PT pulses bilaterally Extremities:  No cyanosis, clubbing, edema  Neurologic:  alert & oriented X3, cranial nerves II-XII intact, strength normal in all extremities, sensation intact to  light touch, and gait normal.   Skin:  turgor normal and no rashes.   Psych:  Oriented X3, memory intact for recent and remote, normally interactive, good eye contact, not anxious appearing, and not depressed appearing.   LABS:  None ASSESSMENT AND PLAN:  Left facial abscess: Restarting Bactrim as Dr. Daiva Eves was using this antibiotic. Have called ENT who will evaluate patient for likely I&D. Long-term therapy will depend on culture results.  Hx renal transplant patient: Have placed on home meds. I called Dr. Eliott Nine and informed her of this patient's hospitalization. Have sent prograf level.  DVT prophy: Lovenox      VTE PROPH: lovenox

## 2010-09-13 NOTE — Progress Notes (Signed)
Summary: Care Plan Oversight  Phone Note Outgoing Call   Call placed by: Acey Lav MD,  November 07, 2009 10:39 PM Details for Reason: Care Plan OVersight Summary of Call: [phone] (30 or more mins)  I have supervised home care and/or infusion therapy for this pt, including providing orders for care, review of labs and/or home health care plans, communicating with the home health care professionals and/or patient/caregivers to integrate current information into the medical treatment plan and/or adjust the medical therapy. This supervision has been provided for 32__minutes during the calendar month. Dates for this oversigh 3/29/1 thru 4/28/11_.   Initial call taken by: Acey Lav MD,  November 07, 2009 10:40 PM     Appended Document: Care Plan Oversight APPEND: PATIENT WAS BEING TREATED FOR RECURRENT FACIAL ABSCESS

## 2010-09-13 NOTE — Miscellaneous (Signed)
Summary: Orders Update pft charges  Clinical Lists Changes  Orders: Added new Service order of Carbon Monoxide diffusing w/capacity (94720) - Signed Added new Service order of Lung Volumes (94240) - Signed Added new Service order of Spirometry (Pre & Post) (94060) - Signed 

## 2010-09-13 NOTE — Miscellaneous (Signed)
Summary: [facility]: Orders  [facility]: Orders   Imported By: Florinda Marker 11/30/2009 16:12:46  _____________________________________________________________________  External Attachment:    Type:   Image     Comment:   External Document

## 2010-09-13 NOTE — Letter (Signed)
Summary: [facility]   Imported By: Florinda Marker 11/13/2009 16:47:00  _____________________________________________________________________  External Attachment:    Type:   Image     Comment:   External Document

## 2010-09-13 NOTE — Progress Notes (Signed)
Summary: refill for percocet/TY  Phone Note Call from Patient   Caller: Patient Call For: Paulette Blanch Dam MD Summary of Call: Patient wants an early refill on percocet 10-325 she says that the oxcodone that was given to her in the hospital she can't take because it causes her not function properly, and it makes her feel out of it.  Initial call taken by: Starleen Arms CMA,  November 13, 2009 3:23 PM  Follow-up for Phone Call        That is [facility] but this is a finite prescribing relationship.  Follow-up by: Acey Lav MD,  November 13, 2009 11:21 PM  Additional Follow-up for Phone Call Additional follow up Details #1::        ok thanks i will let her know Additional Follow-up by: Starleen Arms CMA,  November 14, 2009 11:00 AM    Prescriptions: PERCOCET 10-325 MG TABS (OXYCODONE-ACETAMINOPHEN) take one tablet three times a day and may take extra tablet as needed pain  #40 x 0   Entered by:   Starleen Arms CMA   Authorized by:   Acey Lav MD   Signed by:   Starleen Arms CMA on 11/14/2009   Method used:   Handwritten   RxID:   [id number]

## 2010-09-13 NOTE — Miscellaneous (Signed)
Summary: [facility]:Physicial Therapy  Home Health:Physicial Therapy   Imported By: Florinda Marker 01/17/2010 15:50:21  _____________________________________________________________________  External Attachment:    Type:   Image     Comment:   External Document

## 2010-09-13 NOTE — Miscellaneous (Signed)
Summary: HIPAA Rerstrictions  HIPAA Rerstrictions   Imported By: Florinda Marker 10/30/2009 16:47:19  _____________________________________________________________________  External Attachment:    Type:   Image     Comment:   External Document

## 2010-09-13 NOTE — Progress Notes (Signed)
Summary: nos appt  Phone Note Call from Patient   Caller: [email or web address]  Call For: [email or web address] Summary of Call: In ref to nos from 11/15, pt states she doesn't want to rsc. Initial call taken by: Darletta Moll,  June 27, 2010 11:07 AM

## 2010-09-13 NOTE — Miscellaneous (Signed)
Summary: [facility]: ORders  [facility]: ORders   Imported By: Florinda Marker 12/22/2009 16:15:33  _____________________________________________________________________  External Attachment:    Type:   Image     Comment:   External Document

## 2010-09-13 NOTE — Miscellaneous (Signed)
Summary: [facility]: Orders  [facility]: Orders   Imported By: Florinda Marker 03/07/2010 10:33:22  _____________________________________________________________________  External Attachment:    Type:   Image     Comment:   External Document

## 2010-09-13 NOTE — Progress Notes (Signed)
Summary: Requesting pain meds  Phone Note Call from Patient   Caller: Patient Summary of Call: Pt. c/o pain in her face from previous facial abcess.  Requesting pain medication and for Dr. Algis Liming to call her back on her cell # [phone] Initial call taken by: Wendall Mola CMA Duncan Dull),  April 04, 2010 4:40 PM  Follow-up for Phone Call        I am not prescribing pain medicines for prior infections. I can call her back tomorrow. Follow-up by: Acey Lav MD,  April 04, 2010 5:24 PM  Additional Follow-up for Phone Call Additional follow up Details #1::        I spoke with pt will give her enough  to get to her appt with nephrology on Sept 9th. Can Tresa Endo (WELCOME BACK!!!) or other clinic MD sign the script (I am in [place]) Additional Follow-up by: Acey Lav MD,  April 05, 2010 12:43 PM    New/Updated Medications: HYDROCODONE-ACETAMINOPHEN 7.5-500 MG TABS (HYDROCODONE-ACETAMINOPHEN) take one tab once daily as needed for pain Prescriptions: HYDROCODONE-ACETAMINOPHEN 7.5-500 MG TABS (HYDROCODONE-ACETAMINOPHEN) take one tab once daily as needed for pain  #14 x 0   Entered and Authorized by:   Acey Lav MD   Signed by:   Paulette Blanch Dam MD on 04/05/2010   Method used:   Print then Give to Patient   RxID:   [id number]   Appended Document: Requesting pain meds Dr. Philipp Deputy signed RX, thanks Dr. Daiva Eves

## 2010-09-13 NOTE — Progress Notes (Signed)
Summary: RN calling from [facility]  Phone Note Other Incoming   Caller: [facility] Summary of Call: called [facility] re message aparently left by RN. They will have her call me back on my cell phone. Initial call taken by: Acey Lav MD,  Jan 02, 2010 10:16 AM  Follow-up for Phone Call        Ms [name] had erythema at the PICC line. I have had HHRN pull picc line and have had [name] restart her oral bactrim Follow-up by: Acey Lav MD,  Jan 02, 2010 10:46 AM

## 2010-09-13 NOTE — Progress Notes (Signed)
Summary: Pt to see Sampson Goon on Monday at 330pm  Phone Note Call from Patient   Caller: Patient Summary of Call: Pt stilll with redness on her face and throbbing pain. ENT wanted her seen by [place] first. her nephrologist apparently was worried about her as well. She is finishing cubicin today. I am changing her to bactrim 1ds two times a day and am giving her 20 d supply of percocet. She is to see Dr. Sampson Goon on Monday at 330pm Initial call taken by: Acey Lav MD,  December 04, 2009 12:24 PM    New/Updated Medications: PERCOCET 10-325 MG TABS (OXYCODONE-ACETAMINOPHEN) take one and one half tablet twice a day as needed BACTRIM DS 800-160 MG TABS (SULFAMETHOXAZOLE-TRIMETHOPRIM) Take 1 tablet by mouth two times a day until seen by Dr. Daiva Eves Prescriptions: PERCOCET 10-325 MG TABS (OXYCODONE-ACETAMINOPHEN) take one and one half tablet twice a day as needed  #60 x 0   Entered and Authorized by:   Acey Lav MD   Signed by:   Paulette Blanch Dam MD on 12/04/2009   Method used:   Print then Give to Patient   RxID:   [id number] BACTRIM DS 800-160 MG TABS (SULFAMETHOXAZOLE-TRIMETHOPRIM) Take 1 tablet by mouth two times a day until seen by Dr. Daiva Eves  #60 x 4   Entered and Authorized by:   Acey Lav MD   Signed by:   Paulette Blanch Dam MD on 12/04/2009   Method used:   Print then Give to Patient   RxID:   [id number]

## 2010-09-13 NOTE — Discharge Summary (Signed)
Summary: Hospital Discharge Update 12/23/2009    Hospital Discharge Update:  Date of Admission: 12/21/2009 Date of Discharge: 12/23/2009  Brief Summary:  Pt being discharged after ENT and ID evaluation. No abcess to drain per MRI. Pt beign discharged on daptomycin with home health and PICC line. We will target higher dose of 6mg /jg due to recurrence.  Lab or other results pending at discharge:  prograf levels  Medication list changes:  Removed medication of PERCOCET 10-325 MG TABS (OXYCODONE-ACETAMINOPHEN) take one and one and half tablets three times a day Added new medication of ROXICODONE 5 MG TABS (OXYCODONE HCL) every six hours as needed for pain - Signed Rx of ROXICODONE 5 MG TABS (OXYCODONE HCL) every six hours as needed for pain;  #24 x 0;  Signed;  Entered by: Clerance Lav MD;  Authorized by: Clerance Lav MD;  Method used: Print then Give to Patient  Discharge medications:  MYFORTIC 360 MG TBEC (MYCOPHENOLATE SODIUM) 2 tablets two times a day PROGRAF 1 MG CAPS (TACROLIMUS) 3 capsules in the am and 3 capsules in the pm PREDNISONE 10 MG TABS (PREDNISONE) 2 tabs each morning AMLODIPINE BESYLATE 5 MG TABS (AMLODIPINE BESYLATE)  ALPRAZOLAM 0.5 MG TABS (ALPRAZOLAM) Take 1 tablet by mouth twice a day BACTRIM DS 800-160 MG TABS (SULFAMETHOXAZOLE-TRIMETHOPRIM) Take 1 tablet by mouth two times a day until seen by Dr. Daiva Eves ZOLPIDEM TARTRATE 10 MG TABS (ZOLPIDEM TARTRATE) Take 1 tablet by mouth each evening as needed for insomnia ROXICODONE 5 MG TABS (OXYCODONE HCL) every six hours as needed for pain  Other patient instructions:  Please call clinic on Monday to set up your follow up appointment in next 2 weeks.

## 2010-09-13 NOTE — Progress Notes (Signed)
Summary: Changed to Dapto  Phone Note Other Incoming   Summary of Call: call from Dr Eliott Nine who noted that pt has MRSA on cx (prior MSSA). Called [facility] in [place]  to order daptomycin 4 mg/kg (Vanco allergic).  Asked for CPK at [place] and weekly Initial call taken by: Clydie Braun MD,  November 09, 2009 11:09 AM  Follow-up for Phone Call        called [employer] to get approval fot dapto [id number]   Id [id number] Orbie Hurst [phone]  ref #[id number] Follow-up by: Clydie Braun MD,  November 09, 2009 12:57 PM     Appended Document: Changed to Dapto Thanks Theodoro Grist. Sorry that her MRSA growth this time was missed.

## 2010-09-13 NOTE — Progress Notes (Signed)
Summary: Care Plan Oversight for Facial abscess  Phone Note Outgoing Call   Call placed by: Acey Lav MD,  Dec 29, 2009 3:18 PM Details for Reason: Care Plan Oversight Summary of Call: [phone](> 60 mins) I have supervised home care and/or infusion therapy for this pt, including providing orders for care, review of labs and/or home health care plans, communicating with the home health care professionals and/or patient/caregivers to integrate current information into the medical treatment plan and/or adjust the medical therapy. This supervision has been provided for _62__minutes during the calendar month. Dates for this oversight  12/08/09 thru 01/06/10.  Patient being treated for recurrent facial abscess with MRSA.   Initial call taken by: Acey Lav MD,  Dec 29, 2009 3:18 PM

## 2010-09-13 NOTE — Miscellaneous (Signed)
Summary: [facility]: Orders  [facility]: Orders   Imported By: Florinda Marker 11/30/2009 16:15:37  _____________________________________________________________________  External Attachment:    Type:   Image     Comment:   External Document

## 2010-09-13 NOTE — Miscellaneous (Signed)
Summary: [facility]: Orders  [facility]: Orders   Imported By: Florinda Marker 01/10/2010 11:42:50  _____________________________________________________________________  External Attachment:    Type:   Image     Comment:   External Document

## 2010-09-13 NOTE — Consult Note (Signed)
Summary: [facility]  [facility]   Imported By: Florinda Marker 03/08/2010 11:29:03  _____________________________________________________________________  External Attachment:    Type:   Image     Comment:   External Document

## 2010-09-13 NOTE — Assessment & Plan Note (Signed)
Summary: WORK IN   Visit Type:  Follow-up Referring Provider:  Camille Bal, MD Primary Provider:  None  CC:  f/u .  History of Present Illness: 35 year old lady with hx of ESRD and kidney transplant last spring who was admitted [facility] with a left facial abscess and underwent I and D by Dr. Arn Medal from ENT. Cultures grew MSSA (R to clindamcyin though) and she was transitioned from inital daptomycin to ancef adn then sent hom on keflex 500mg  qid. Her facial edema has apparently markedly improved compared to when she was admitted to the hospital and seen by Dr. Arn Medal and my partner Dr. Orvan Falconer. She failed to improve on oral antibiotics and was readmitted by me (moonlighting for [employer]) and underwent repeat I an D by ENT and was sent home on ancef, but this time actually grew MRSA from deep cultures S to vanco, tetra, TMP clinda (the MSSA was clinda R , tetra S, tmp s, ox S) and was switched to daptomycin. She improved on the daptomycin and actually received greater than 3 weeks of IV daptomycin at 4mg /kg. However there remained an area of exquisite tenderness on the nasal aspect of her face that is also fluctuant. She had called on the 25th when her cubicin was runnign out and was concerned about the persistent area of fluctuance. I called her back (contrary to what is documented in the notes from [facility] subsequent to then when she apparently claimed she was not called back) and informed her that if she still had an area of fluctuance she would need repeat I and D and that protracted daptomycin would not solve the problem. I put her on oral bactri at two tablets twice daily. I have also refilled her narcotic script. Today she had been urgently scheduled to see Dr. Sampson Goon in the afternoon but came in the morning and I worked her in as a different patient had cancelled. She continues to have  significant pain in her face and orbit and is taking 1.5 percocet 10s three times a day now. She denies  subjective fevers but is still suffering.  She stats taht Dr. Pollyann Kennedy performed the most recent surgery. I offered her admission to the B service today but she wished to come back later in the week. THerefore we will plan on admitting her to the b service on THursday. I have let our upper level EMR Dr. Andrey Campanile nkow about the pt and will be calling Dr. Pollyann Kennedy shortly.  Problems Prior to Update: 1)  Methicillin Resistant Staphylococcus Aureus  (ICD-041.12) 2)  Bacteremia  (ICD-790.7) 3)  Methicillin Resistant Staph Aureus Septicemia  (ICD-038.12) 4)  Kidney Transplantation, Hx of  (ICD-V42.0) 5)  Essential Hypertension  (ICD-401.9) 6)  Abscess, Face  (ICD-682.0) 7)  Methicillin Susceptible Staph Inf Cce & Uns Site  (ICD-041.11)  Medications Prior to Update: 1)  Myfortic 360 Mg Tbec (Mycophenolate Sodium) .... 2 Tablets Two Times A Day 2)  Prograf 1 Mg Caps (Tacrolimus) .... 4 Capsules in The Am and 3 Capsules in The Pm 3)  Prednisone 10 Mg Tabs (Prednisone) .Marland Kitchen.. 1 Daily 4)  Amlodipine Besylate 5 Mg Tabs (Amlodipine Besylate) 5)  Alprazolam 0.5 Mg Tabs (Alprazolam) .Marland Kitchen.. 1 Daily 6)  Ambien 5 Mg Tabs (Zolpidem Tartrate) .Marland Kitchen.. 1 At Bedtime 7)  Percocet 10-325 Mg Tabs (Oxycodone-Acetaminophen) .... Take One and One Half Tablet Twice A Day As Needed 8)  Cubicin 500 Mg Solr (Daptomycin) .... 6mg /kg Dosed By [facility] 9)  Bactrim  Ds 800-160 Mg Tabs (Sulfamethoxazole-Trimethoprim) .... Take 1 Tablet By Mouth Two Times A Day Until Seen By Dr. Daiva Eves  Current Medications (verified): 1)  Myfortic 360 Mg Tbec (Mycophenolate Sodium) .... 2 Tablets Two Times A Day 2)  Prograf 1 Mg Caps (Tacrolimus) .... 4 Capsules in The Am and 3 Capsules in The Pm 3)  Prednisone 10 Mg Tabs (Prednisone) .Marland Kitchen.. 1 Daily 4)  Amlodipine Besylate 5 Mg Tabs (Amlodipine Besylate) 5)  Alprazolam 0.5 Mg Tabs (Alprazolam) .Marland Kitchen.. 1 Daily 6)  Ambien 5 Mg Tabs (Zolpidem Tartrate) .Marland Kitchen.. 1 At Bedtime 7)  Percocet 10-325 Mg Tabs  (Oxycodone-Acetaminophen) .... Take One and One and Half Tablets Three Times A Day 8)  Cubicin 500 Mg Solr (Daptomycin) .... 6mg /kg Dosed By [facility] 9)  Bactrim Ds 800-160 Mg Tabs (Sulfamethoxazole-Trimethoprim) .... Take 1 Tablet By Mouth Two Times A Day Until Seen By Dr. Daiva Eves  Allergies (verified): No Known Drug Allergies    Current Allergies (reviewed today): No known allergies  Review of Systems       The patient complains of suspicious skin lesions.  The patient denies weight loss, weight gain, hoarseness, chest pain, syncope, dyspnea on exertion, peripheral edema, prolonged cough, headaches, hemoptysis, abdominal pain, melena, hematochezia, severe indigestion/heartburn, hematuria, incontinence, genital sores, muscle weakness, transient blindness, difficulty walking, depression, unusual weight change, abnormal bleeding, and enlarged lymph nodes.    Vital Signs:  Patient profile:   35 year old female Menstrual status:  regular Height:      61 inches (154.94 cm) Weight:      164 pounds (74.55 kg) BMI:     31.10 Temp:     98.5 degrees F (36.94 degrees C) oral Pulse rate:   88 / minute BP sitting:   182 / 145  (right arm)  Vitals Entered By: Starleen Arms CMA (Dec 18, 2009 9:43 AM) CC: f/u  Is Patient Diabetic? No Pain Assessment Patient in pain? yes     Location: face Intensity: 10 Type: aching Nutritional Status BMI of > 30 = obese Nutritional Status Detail not eating well  Have you ever been in a relationship where you felt threatened, hurt or afraid?No   Does patient need assistance? Functional Status Self care Ambulation Normal   Physical Exam  General:  alert and overweight-appearing.   Head:  left facial swelling near her nose persists and is exquisitely tender to palpation Eyes:  vision grossly intact, pupils equal, pupils round, and pupils reactive to light.   Ears:  no external deformities.   Nose:  no external deformity.   Mouth:  no thrush, no  erythema, moist Neck:  thicksupple.   Lungs:  normal respiratory effort, no crackles, and no wheezes.   Heart:  normal rate, regular rhythm, no murmur, no gallop, and no rub.   Abdomen:  soft, non-tender, normal bowel sounds, and no distention.   Msk:  normal ROM.   Neurologic:  alert & oriented X3 and gait normal.   Skin:  see head and neck, Psych:  Oriented X3, dysphoric but pleasant   Impression & Recommendations:  Problem # 1:  ABSCESS, FACE (ICD-682.0) Assessment Deteriorated  She has failed antibiotic therapy alone and is going to need yet another I and D of her facial abscess. I offered her admission today but she wishes to come back on THursday. I will myself be out of town that day but I have called in a bed request for Thursday and have notified the EMR resident  with the B service. She can continue the bactrim for now and is to IMMEDIATELY come to hospital for direct admission shouls she deteroirate. I have refilled her narcotic script to give her enough for 4.5 10mg  pills for 30 days. Her updated medication list for this problem includes:    Percocet 10-325 Mg Tabs (Oxycodone-acetaminophen) .Marland Kitchen... Take one and one and half tablets three times a day    Bactrim Ds 800-160 Mg Tabs (Sulfamethoxazole-trimethoprim) .Marland Kitchen... Take 1 tablet by mouth two times a day until seen by dr. Zenaida Niece dam  Orders: Est. Patient Level V [phone])  Problem # 2:  METHICILLIN RESISTANT STAPHYLOCOCCUS AUREUS (ICD-041.12)  this 2nd occasion was MRSA that grew after she underwent I and D and dc fro the hospital. I would put her on daptomycin after her repeat I and D  Orders: Est. Patient Level V (60454)  Problem # 3:  ESSENTIAL HYPERTENSION (ICD-401.9)  BP is high again maybe in part seconary to pain. Will fax Dr. Eliott Nine todays note. I have asked the B service resident to plug her into [facility] clinci so she can have priamry care MD in future Her updated medication list for this problem includes:    Amlodipine  Besylate 5 Mg Tabs (Amlodipine besylate)  Orders: Est. Patient Level V (09811)  Problem # 4:  METHICILLIN SUSCEPTIBLE STAPH INF CCE & UNS SITE (ICD-041.11)  had this the first time in her abscess  Orders: Est. Patient Level V (91478)  Problem # 5:  KIDNEY TRANSPLANTATION, HX OF (ICD-V42.0) she informs me that her creatinine was stable on the bactrim (can cause pseudoelevation) Orders: Est. Patient Level V (29562)  Medications Added to Medication List This Visit: 1)  Percocet 10-325 Mg Tabs (Oxycodone-acetaminophen) .... Take one and one and half tablets three times a day  Patient Instructions: 1)  You need to be admitted to the hospital for surgery 2)  We will arrange for this and you can come back to admitting offic eon Thursday 3)  If your condition worsens YOU MUST COME TO hospital immediately Prescriptions: PERCOCET 10-325 MG TABS (OXYCODONE-ACETAMINOPHEN) take one and one and half tablets three times a day  #135 x 0   Entered and Authorized by:   Acey Lav MD   Signed by:   Paulette Blanch Dam MD on 12/18/2009   Method used:   Print then Give to Patient   RxID:   [id number]

## 2010-09-13 NOTE — Miscellaneous (Signed)
Summary: [facility]:Orders  [facility]:Orders   Imported By: Florinda Marker 12/21/2009 10:25:59  _____________________________________________________________________  External Attachment:    Type:   Image     Comment:   External Document

## 2010-09-13 NOTE — Assessment & Plan Note (Signed)
Summary: HFU-Cellulitis/pull cone chart/kdw   Visit Type:  Follow-up  CC:  hsfu and facial cellulitis.  History of Present Illness: 35 year old lady with hx of ESRD and kidney transplant last spring who was admitted [facility] with a left facial abscess and underwent I and D by Dr. Arn Medal from ENT. Cultures grew MSSA (R to clindamcyin though) and she was transitioned from inital daptomycin to ancef adn then sent hom on keflex 500mg  qid. Her facial edema has apparently markedly improved compared to when she was admitted to the hospital and seen by Dr. Arn Medal and my partner Dr. Orvan Falconer. HOwever she states that the swelling has not improved "at all' since she was dc from the hospital last MOnday. We discussed varous options including re-admitssion, placment of PICC and rx with IV abx at infusion center. She wanted to continue with oral antibiotics and I offered to increase the dose of keflex, largely because she wants desperately to spend time with her children this weekend (whom she sees infrequently due to divorce with her huband who "took them away from me." On exam her left cheek is erythematous and exquisitely tender to palpation. She cannot smile properly due to pain and has dificulty eating having to chew in right side of mouth. I told her had she not already had surgery and been seen by one of my partner in the hospital and had apparent improvement I would have insisted on admitting her today. I am arrangin for followup with Dr. Arn Medal because I actually think that further I and D is likely the most important part of her therapy.  Problems Prior to Update: None  Medications Prior to Update: 1)  None  Current Medications (verified): 1)  Myfortic 360 Mg Tbec (Mycophenolate Sodium) .... 2 Tablets Two Times A Day 2)  Prograf 1 Mg Caps (Tacrolimus) .... 4 Capsules in The Am and 3 Capsules in The Pm 3)  Prednisone 10 Mg Tabs (Prednisone) .Marland Kitchen.. 1 Daily 4)  Amlodipine Besylate 5 Mg Tabs (Amlodipine  Besylate) 5)  Alprazolam 0.5 Mg Tabs (Alprazolam) .Marland Kitchen.. 1 Daily 6)  Ambien 5 Mg Tabs (Zolpidem Tartrate) .Marland Kitchen.. 1 At Bedtime 7)  Percocet 10-325 Mg Tabs (Oxycodone-Acetaminophen) .... Take One Tablet Three Times A Day and May Take Extra Tablet As Needed Pain 8)  Keflex 500 Mg Caps (Cephalexin) .... Take  Two Tablets Four Times A Day  Allergies (verified): No Known Drug Allergies    Current Allergies (reviewed today): No known allergies  Family History: non contributory  Social History: divorced, rare etoh  Additional History Menstrual Status:  regular  Review of Systems       The patient complains of anorexia, suspicious skin lesions, and depression.  The patient denies fever, weight loss, weight gain, vision loss, decreased hearing, hoarseness, chest pain, syncope, dyspnea on exertion, peripheral edema, prolonged cough, headaches, hemoptysis, abdominal pain, melena, hematochezia, severe indigestion/heartburn, hematuria, incontinence, genital sores, muscle weakness, transient blindness, difficulty walking, unusual weight change, and abnormal bleeding.    Vital Signs:  Patient profile:   35 year old female Menstrual status:  regular LMP:     10/16/2009 Height:      61 inches (154.94 cm) Weight:      162.38 pounds (73.81 kg) BMI:     30.79 Temp:     98.1 degrees F (36.72 degrees C) oral Pulse rate:   93 / minute BP sitting:   172 / 133  (left arm)  Vitals Entered By: Starleen Arms CMA (October 30, 2009 4:04 PM) CC: hsfu, facial cellulitis LMP (date): 10/16/2009     Menstrual Status regular Enter LMP: 10/16/2009   Physical Exam  General:  alert and overweight-appearing.   Head:  left facial swelling not able to smile fully, slight trismus with attempt at smile Eyes:  vision grossly intact, pupils equal, pupils round, and pupils reactive to light.   Ears:  no external deformities.   Nose:  no external deformity.   Mouth:  cannot visualize much of her mouth due to her  facieal swelling Neck:  thick Lungs:  normal respiratory effort, no crackles, and no wheezes.   Heart:  normal rate, regular rhythm, no murmur, no gallop, and no rub.   Abdomen:  soft, non-tender, normal bowel sounds, and no distention.   Msk:  normal ROM.   Extremities:  1+ left pedal edema and 1+ right pedal edema.   Neurologic:  alert & oriented X3 and gait normal.   Skin:  her left cheekc with erythema and exquisite tenderness to palpation. Skin is farily taught Psych:  Oriented X3, depressed affect, and moderately anxious.     Impression & Recommendations:  Problem # 1:  ABSCESS, FACE (ICD-682.0) Granted I never saw the pt as an inpatient and she describes dramatic improvement with I and D and abx but plateau since she was dc to home. I am dissatisfied with her course. I think she is going to need repeat I and D and in meantime would likely benefit form IV antibiotids. She wanteed to try orals for another week so that she can see her kids this weekend but I think it would be more prudent for her to obtain IV abx and more importatnly prompt surgical re-eavluation.  She is highly immunocompromised pt with kidney tranplant and I am not very comfortable trying to wait a week. I will touch base with her tomorrow again and I will touch base with DR. Byars. ONe option that might work with medicare would be to give her once daily daptomycin at infusion center, though cefazolin would be better drug and cheaper one as well. I upped her percocet dose Her updated medication list for this problem includes:    Percocet 10-325 Mg Tabs (Oxycodone-acetaminophen) .Marland Kitchen... Take one tablet three times a day and may take extra tablet as needed pain    Keflex 500 Mg Caps (Cephalexin) .Marland Kitchen... Take  two tablets four times a day  Orders: T-Basic Metabolic Panel [phone]) T-CBC w/Diff [phone]) T-Sed Rate (Automated) [phone]) T-C-Reactive Protein [id number]) Est. Patient Level V (28413)  Problem # 2:   METHICILLIN SUSCEPTIBLE STAPH INF CCE & UNS SITE (ICD-041.11) pathogen involved with her facial abscess Orders: T-Basic Metabolic Panel [phone]) T-CBC w/Diff [phone]) T-Sed Rate (Automated) [id number]) T-C-Reactive Protein [id number]) Est. Patient Level V (16606)  Problem # 3:  KIDNEY TRANSPLANTATION, HX OF (ICD-V42.0)  Again another reason for this pt to easly FAIL oral therapy. Will call her back in am.  Orders: Est. Patient Level V (30160)  Problem # 4:  METHICILLIN RESISTANT STAPH AUREUS SEPTICEMIA (ICD-038.12)  hx of MRSa in past in HD catheter related bacteremia. would put on contact precautions in hospital with hx.  Orders: Est. Patient Level V (10932)  Problem # 5:  ESSENTIAL HYPERTENSION (ICD-401.9)  Poorly controlled but also with poorly controlled pain. Her updated medication list for this problem includes:    Amlodipine Besylate 5 Mg Tabs (Amlodipine besylate)  Orders: Est. Patient Level V (35573)  Medications Added to Medication List This Visit: 1)  Myfortic 360 Mg Tbec (Mycophenolate sodium) .... 2 tablets two times a day 2)  Prograf 1 Mg Caps (Tacrolimus) .... 4 capsules in the am and 3 capsules in the pm 3)  Prednisone 10 Mg Tabs (Prednisone) .Marland Kitchen.. 1 daily 4)  Amlodipine Besylate 5 Mg Tabs (Amlodipine besylate) 5)  Alprazolam 0.5 Mg Tabs (Alprazolam) .Marland Kitchen.. 1 daily 6)  Ambien 5 Mg Tabs (Zolpidem tartrate) .Marland Kitchen.. 1 at bedtime 7)  Percocet 10-325 Mg Tabs (Oxycodone-acetaminophen) .... Take one tablet three times a day and may take extra tablet as needed pain 8)  Keflex 500 Mg Caps (Cephalexin) .... Take  two tablets four times a day  Patient Instructions: 1)  rtc to see Dr. Daiva Eves next Monday afternoon OK to overbook Prescriptions: KEFLEX 500 MG CAPS (CEPHALEXIN) take  two tablets four times a day  #240 x 1   Entered and Authorized by:   Acey Lav MD   Signed by:   Paulette Blanch Dam MD on 10/30/2009   Method used:   Print then Give to  Patient   RxID:   [id number] PERCOCET 10-325 MG TABS (OXYCODONE-ACETAMINOPHEN) take one tablet three times a day and may take extra tablet as needed pain  #40 x 0   Entered and Authorized by:   Acey Lav MD   Signed by:   Paulette Blanch Dam MD on 10/30/2009   Method used:   Print then Give to Patient   RxID:   [id number]  Process Orders Check Orders Results:     [facility]: Check successful Tests Sent for requisitioning (October 30, 2009 9:38 PM):     10/30/2009: [facility] -- T-Basic Metabolic Panel [id number] (signed)     10/30/2009: [facility] -- T-CBC w/Diff [[id number]] (signed)     10/30/2009: [facility] -- T-Sed Rate (Automated) [[id number]] (signed)     10/30/2009: [facility] -- T-C-Reactive Protein [id number] (signed)

## 2010-09-13 NOTE — Miscellaneous (Signed)
Summary: [facility]: Orders  [facility]: Orders   Imported By: Florinda Marker 01/26/2010 10:40:22  _____________________________________________________________________  External Attachment:    Type:   Image     Comment:   External Document

## 2010-09-13 NOTE — Assessment & Plan Note (Signed)
Summary: HFU/VS   Visit Type:  Follow-up Primary Provider:  Marina Gravel   History of Present Illness: 35 year old lady with hx of ESRD and kidney transplant last spring who was admitted [facility] with a left facial abscess and underwent I and D by Dr. Arn Medal from ENT. Cultures grew MSSA (R to clindamcyin though) and she was transitioned from inital daptomycin to ancef adn then sent hom on keflex 500mg  qid. Her facial edema has apparently markedly improved compared to when she was admitted to the hospital and seen by Dr. Arn Medal and my partner Dr. Orvan Falconer. She failed to improve on oral antibiotics and was readmitted by me (moonlighting for [employer]) and underwent repeat I an D by ENT and was sent home on ancef, but this time actually grew MRSA from deep cultures S to vanco, tetra, TMP clinda (the MSSA was clinda R , tetra S, tmp s, ox S) and was switched to daptomycin She has felt overall improvement but continues to have a "knot" on the inside of her orbit that has faild to go away. it has not worsened., She denies fevers, chillls, nausea or vomitng. She still has significant pain in her face and orbit and is taking 1.5 percocet 10s three times a day.   Current Medications (verified): 1)  Myfortic 360 Mg Tbec (Mycophenolate Sodium) .... 2 Tablets Two Times A Day 2)  Prograf 1 Mg Caps (Tacrolimus) .... 4 Capsules in The Am and 3 Capsules in The Pm 3)  Prednisone 10 Mg Tabs (Prednisone) .Marland Kitchen.. 1 Daily 4)  Amlodipine Besylate 5 Mg Tabs (Amlodipine Besylate) 5)  Alprazolam 0.5 Mg Tabs (Alprazolam) .Marland Kitchen.. 1 Daily 6)  Ambien 5 Mg Tabs (Zolpidem Tartrate) .Marland Kitchen.. 1 At Bedtime 7)  Percocet 10-325 Mg Tabs (Oxycodone-Acetaminophen) .... Take One and One Half Tablet Hree Times A Day and May Take Extra Tablet As Needed Pain 8)  Cubicin 500 Mg Solr (Daptomycin) .... 6mg /kg Dosed By [facility]  Allergies: No Known Drug Allergies    Current Allergies: No known allergies  Past History:  Past Medical History: Renal  transplant HTN MSSA and MRSA facial absess Diabetes  Past Surgical History: Renal transplant in 11/2008  Family History: Reviewed history from 10/30/2009 and no changes required. non contributory  Social History: Reviewed history from 10/30/2009 and no changes required. divorced, rare etoh  Review of Systems       The patient complains of suspicious skin lesions.  The patient denies anorexia, fever, weight loss, weight gain, vision loss, decreased hearing, hoarseness, chest pain, syncope, dyspnea on exertion, peripheral edema, prolonged cough, headaches, hemoptysis, abdominal pain, melena, hematochezia, severe indigestion/heartburn, hematuria, incontinence, genital sores, muscle weakness, transient blindness, difficulty walking, depression, unusual weight change, abnormal bleeding, and enlarged lymph nodes.    Physical Exam  General:  alert and overweight-appearing.   Head:  left facial swelling improved vs last time I saw her she still has swelling and tenderness under her orbit and near nose, Eyes:  vision grossly intact, pupils equal, pupils round, and pupils reactive to light.   Ears:  no external deformities.   Nose:  no external deformity.   Mouth:  no thrush, no erythema, moist Neck:  thicksupple.   Lungs:  normal respiratory effort, no crackles, and no wheezes.   Heart:  normal rate, regular rhythm, no murmur, no gallop, and no rub.   Abdomen:  soft, non-tender, normal bowel sounds, and no distention.   Msk:  normal ROM.   Neurologic:  alert & oriented  X3 and gait normal.   Skin:  see head and neck, PICC line CDI Psych:  Oriented X3, depressed affect, less anxious today   Impression & Recommendations:  Problem # 1:  ABSCESS, FACE (ICD-682.0)  Grew MSSA the first time and MRSA on 2nd I and D. I will give her an additional 2 weeks of IV daptomycin, if this worsens or fails to respond will touch base with ENT again would then need yet another I and D. rtc in 2wks  time The following medications were removed from the medication list:    Keflex 500 Mg Caps (Cephalexin) .Marland Kitchen... Take  two tablets four times a day Her updated medication list for this problem includes:    Percocet 10-325 Mg Tabs (Oxycodone-acetaminophen) .Marland Kitchen... Take one and one half tablet hree times a day and may take extra tablet as needed pain  Orders: Est. Patient Level IV (04540)  Problem # 2:  METHICILLIN RESISTANT STAPHYLOCOCCUS AUREUS (ICD-041.12)  grew MRSA this time. Should be decolonized after this infection subsides with hibiclenz and bactroban  Orders: Est. Patient Level IV (98119)  Problem # 3:  METHICILLIN SUSCEPTIBLE STAPH INF CCE & UNS SITE (ICD-041.11)  first time grew MSSA. again will do decolonization once infection cleared  Orders: Est. Patient Level IV (14782)  Problem # 4:  KIDNEY TRANSPLANTATION, HX OF (ICD-V42.0)  checking labs while on abx, followed by Dr. Caryn Section  Orders: Est. Patient Level IV (95621)  Medications Added to Medication List This Visit: 1)  Cubicin 500 Mg Solr (Daptomycin) .... 6mg /kg dosed by [facility]

## 2010-09-13 NOTE — Consult Note (Signed)
Summary: [facility]   [facility]   Imported By: Florinda Marker 11/30/2009 16:09:37  _____________________________________________________________________  External Attachment:    Type:   Image     Comment:   External Document

## 2010-09-13 NOTE — Assessment & Plan Note (Signed)
Summary: [name]   Visit Type:  Follow-up Referring Provider:  Camille Bal, MD Primary Provider:  None  CC:  follow-up visit.  History of Present Illness: 35 year old lady with hx of ESRD and kidney transplant last spring who was admitted to [facility] with a left facial abscess and underwent I and D by Dr. Arn Medal from ENT. Cultures grew MSSA (R to clindamcyin though) and she was transitioned from inital daptomycin to ancef adn then sent hom on keflex 500mg  qid. Her facial edema has apparently markedly improved compared to when she was admitted to the hospital and seen by Dr. Arn Medal and my partner Dr. Orvan Falconer. She failed to improve on oral antibiotics and was readmitted by me (moonlighting for [employer]) and underwent repeat I an D by ENT and was sent home on ancef, but this time actually grew MRSA from deep cultures S to vanco, tetra, TMP clinda (the MSSA was clinda R , tetra S, tmp s, ox S) and was switched to daptomycin. She improved on the daptomycin and actually received greater than 3 weeks of IV daptomycin at 4mg /kg, and was then changed to oral bactrim but unfortunately had persistence of her facial abscess and increased pain and was readmitted by me to the B service and re-evaluated by ENT who did not perform repeat I and D. Pt was instead placed back on daptomycin at dose of 6mg /kg which she received until earlier this week when [facility] called with concerns about erythema about the PICC lines site (pt had developed rash to dressing and was scratching there) and has now been switched back to bactrim two times a day. She feels much improved though she continues to have area that is raised and tender on nasal aspect of her cheek. No fevers, chills, nausea.  Current Medications (verified): 1)  Myfortic 360 Mg Tbec (Mycophenolate Sodium) .... 2 Tablets Two Times A Day 2)  Prograf 1 Mg Caps (Tacrolimus) .... 3 Capsules in The Am and 3 Capsules in The Pm 3)  Prednisone 10 Mg Tabs (Prednisone) .... 2 Tabs  Each Morning 4)  Amlodipine Besylate 5 Mg Tabs (Amlodipine Besylate) 5)  Alprazolam 0.5 Mg Tabs (Alprazolam) .... Take 1 Tablet By Mouth Twice A Day 6)  Bactrim Ds 800-160 Mg Tabs (Sulfamethoxazole-Trimethoprim) .... Take 1 Tablet By Mouth Two Times A Day Until Seen By Dr. Daiva Eves 7)  Zolpidem Tartrate 10 Mg Tabs (Zolpidem Tartrate) .... Take 1 Tablet By Mouth Each Evening As Needed For Insomnia 8)  Oxycodone Hcl 5 Mg Tabs (Oxycodone Hcl) .... Take One To Two Tablets As Needed Up To Twic Daily  Allergies (verified): 1)  ! Vancomycin   Preventive Screening-Counseling & Management  Alcohol-Tobacco     Alcohol drinks/day: 0     Smoking Status: current     Packs/Day: 1.0  Caffeine-Diet-Exercise     Caffeine use/day: tea     Does Patient Exercise: yes     Type of exercise: waitress     Exercise (avg: min/session): >60     Times/week: 4  Safety-Violence-Falls     Seat Belt Use: yes   Current Allergies (reviewed today): ! VANCOMYCIN Family History: Reviewed history from 10/30/2009 and no changes required. non contributory  Social History: Reviewed history from 10/30/2009 and no changes required. divorced, rare etoh  Review of Systems       The patient complains of suspicious skin lesions.  The patient denies anorexia, fever, weight loss, weight gain, vision loss, decreased hearing, hoarseness, chest pain,  syncope, dyspnea on exertion, peripheral edema, prolonged cough, headaches, hemoptysis, abdominal pain, melena, hematochezia, severe indigestion/heartburn, hematuria, incontinence, genital sores, muscle weakness, transient blindness, difficulty walking, depression, unusual weight change, abnormal bleeding, and enlarged lymph nodes.    Vital Signs:  Patient profile:   35 year old female Menstrual status:  regular Height:      61 inches (154.94 cm) Weight:      165.4 pounds (75.18 kg) BMI:     31.37 Temp:     97.7 degrees F (36.50 degrees C) oral Pulse rate:   89 /  minute BP sitting:   157 / 113  (right arm)  Vitals Entered By: Baxter Hire) (Jan 04, 2010 3:16 PM) CC: follow-up visit Pain Assessment Patient in pain? yes     Location: face and right arm Intensity: 3 Type: aching Onset of pain  Constant Nutritional Status BMI of > 30 = obese Nutritional Status Detail appetite is so-so per patient  Does patient need assistance? Functional Status Self care Ambulation Normal   Physical Exam  General:  alert and overweight-appearing.   Head:  left facial swelling near her nose persists but is smaller and less tender to palpation. Not obviously fluctuant Eyes:  vision grossly intact, pupils equal, pupils round, and pupils reactive to light.   Ears:  no external deformities.   Mouth:  no thrush, no erythema, moist Neck:  thicksupple.   Lungs:  normal respiratory effort, no crackles, and no wheezes.   Heart:  normal rate, regular rhythm, no murmur, no gallop, and no rub.   Abdomen:  soft, non-tender, normal bowel sounds, and no distention.   Msk:  normal ROM.   Extremities:  1+ left pedal edema and 1+ right pedal edema.   Neurologic:  alert & oriented X3 and gait normal.   Skin:  see head and neck, Psych:  Oriented X3, pleasantnormally interactive and good eye contact.     Impression & Recommendations:  Problem # 1:  ABSCESS, FACE (ICD-682.0) Improving but still with area of tenderness and slight erythema. Will continuye bactrim and see her again in two weeks. I think this is going to take a while to resolve. THere  may be component of scarring here as well but given her hx of multiple readmissions and the nature of MRSA esp in an immunocompromised pt will extend bactrim for at least two weeks. and likely longer.  The following medications were removed from the medication list:    Roxicodone 5 Mg Tabs (Oxycodone hcl) ..... Every six hours as needed for pain Her updated medication list for this problem includes:    Bactrim Ds 800-160  Mg Tabs (Sulfamethoxazole-trimethoprim) .Marland Kitchen... Take 1 tablet by mouth two times a day until seen by dr. Zenaida Niece dam    Oxycodone Hcl 5 Mg Tabs (Oxycodone hcl) .Marland Kitchen... Take one to two tablets as needed up to twic daily  Orders: Est. Patient Level IV (60454)  Problem # 2:  METHICILLIN RESISTANT STAPHYLOCOCCUS AUREUS (ICD-041.12) After this clears up will do hibilenz and mupirocin again Orders: Est. Patient Level IV (09811)  Problem # 3:  ESSENTIAL HYPERTENSION (ICD-401.9) Poorly controlled at present could be partly relate dto pain but likely need her norvasc increased, will defer to her nephrologist Her updated medication list for this problem includes:    Amlodipine Besylate 5 Mg Tabs (Amlodipine besylate)  Orders: Est. Patient Level IV (99214)  BP today: 157/113 Prior BP: 182/145 (12/18/2009)  Labs Reviewed: K+: 4.6 (10/30/2009) Creat: : 1.60 (10/30/2009)  Problem # 4:  KIDNEY TRANSPLANTATION, HX OF (ICD-V42.0) certainly at high risk for recurrence of mrsa not just in her face burt elsewhere esp with her on immunosuppressives. will try decolonization regimen as above Orders: Est. Patient Level IV (18841)  Medications Added to Medication List This Visit: 1)  Oxycodone Hcl 5 Mg Tabs (Oxycodone hcl) .... Take one to two tablets as needed up to twic daily  Patient Instructions: 1)  fu appt on the 13th of June with Dr. Daiva Eves (OK to overbook) 2)  continuie the bactrim in the meantime Prescriptions: BACTRIM DS 800-160 MG TABS (SULFAMETHOXAZOLE-TRIMETHOPRIM) Take 1 tablet by mouth two times a day until seen by Dr. Daiva Eves  #60 x 4   Entered and Authorized by:   Acey Lav MD   Signed by:   Paulette Blanch Dam MD on 01/04/2010   Method used:   Print then Give to Patient   RxID:   [id number] OXYCODONE HCL 5 MG TABS (OXYCODONE HCL) take one to two tablets as needed up to twic daily  #90 x 0   Entered and Authorized by:   Acey Lav MD   Signed by:   Paulette Blanch  Dam MD on 01/04/2010   Method used:   Print then Give to Patient   RxID:   [id number]

## 2010-09-13 NOTE — Medication Information (Signed)
Summary: [facility]: RX  [facility]: RX   Imported By: Florinda Marker 01/17/2010 15:51:17  _____________________________________________________________________  External Attachment:    Type:   Image     Comment:   External Document

## 2010-09-13 NOTE — Miscellaneous (Signed)
Summary: [facility]   Imported By: Florinda Marker 04/20/2010 11:43:13  _____________________________________________________________________  External Attachment:    Type:   Image     Comment:   External Document

## 2010-10-25 ENCOUNTER — Emergency Department (HOSPITAL_COMMUNITY)
Admission: EM | Admit: 2010-10-25 | Discharge: 2010-10-25 | Disposition: A | Discharge status: Home / Self Care | Payer: Medicare Other | Attending: Emergency Medicine | Admitting: Emergency Medicine

## 2010-10-25 DIAGNOSIS — Z992 Dependence on renal dialysis: Secondary | ICD-10-CM | POA: Insufficient documentation

## 2010-10-25 DIAGNOSIS — I120 Hypertensive chronic kidney disease with stage 5 chronic kidney disease or end stage renal disease: Secondary | ICD-10-CM | POA: Insufficient documentation

## 2010-10-25 DIAGNOSIS — E7800 Pure hypercholesterolemia, unspecified: Secondary | ICD-10-CM | POA: Insufficient documentation

## 2010-10-25 DIAGNOSIS — Z79899 Other long term (current) drug therapy: Secondary | ICD-10-CM | POA: Insufficient documentation

## 2010-10-25 DIAGNOSIS — M2550 Pain in unspecified joint: Secondary | ICD-10-CM | POA: Insufficient documentation

## 2010-10-25 DIAGNOSIS — N186 End stage renal disease: Secondary | ICD-10-CM | POA: Insufficient documentation

## 2010-10-25 DIAGNOSIS — R509 Fever, unspecified: Secondary | ICD-10-CM | POA: Insufficient documentation

## 2010-10-25 LAB — CBC
HCT: 44.8 % (ref 36.0–46.0)
Hemoglobin: 15 g/dL (ref 12.0–15.0)
MCH: 29.6 pg (ref 26.0–34.0)
MCHC: 33.5 g/dL (ref 30.0–36.0)
MCV: 88.5 fL (ref 78.0–100.0)
Platelets: 140 10*3/uL — ABNORMAL LOW (ref 150–400)
RBC: 5.06 MIL/uL (ref 3.87–5.11)
RDW: 12.9 % (ref 11.5–15.5)
WBC: 6 10*3/uL (ref 4.0–10.5)

## 2010-10-25 LAB — COMPREHENSIVE METABOLIC PANEL
ALT: 16 U/L (ref 0–35)
AST: 17 U/L (ref 0–37)
Albumin: 3.7 g/dL (ref 3.5–5.2)
Alkaline Phosphatase: 84 U/L (ref 39–117)
BUN: 14 mg/dL (ref 6–23)
CO2: 23 mEq/L (ref 19–32)
Calcium: 9.2 mg/dL (ref 8.4–10.5)
Chloride: 107 mEq/L (ref 96–112)
Creatinine, Ser: 1.15 mg/dL (ref 0.4–1.2)
GFR calc Af Amer: >60 mL/min (ref >60)
GFR calc non Af Amer: 55 mL/min — ABNORMAL LOW (ref >60)
Glucose, Bld: 95 mg/dL (ref 70–99)
Potassium: 3.9 mEq/L (ref 3.5–5.1)
Sodium: 138 mEq/L (ref 135–145)
Total Bilirubin: 0.6 mg/dL (ref 0.3–1.2)
Total Protein: 6.7 g/dL (ref 6.0–8.3)

## 2010-10-25 LAB — URINALYSIS, ROUTINE W REFLEX MICROSCOPIC
Bilirubin Urine: NEGATIVE
Glucose, UA: NEGATIVE mg/dL
Ketones, ur: NEGATIVE mg/dL
Nitrite: NEGATIVE
Protein, ur: NEGATIVE mg/dL
Specific Gravity, Urine: 1.019 (ref 1.005–1.030)
Urobilinogen, UA: 0.2 mg/dL (ref 0.0–1.0)
pH: 5.5 (ref 5.0–8.0)

## 2010-10-25 LAB — DIFFERENTIAL
Basophils Absolute: 0 10*3/uL (ref 0.0–0.1)
Basophils Relative: 0 % (ref 0–1)
Eosinophils Absolute: 0 10*3/uL (ref 0.0–0.7)
Eosinophils Relative: 1 % (ref 0–5)
Lymphocytes Relative: 11 % — ABNORMAL LOW (ref 12–46)
Lymphs Abs: 0.7 10*3/uL (ref 0.7–4.0)
Monocytes Absolute: 0.3 10*3/uL (ref 0.1–1.0)
Monocytes Relative: 5 % (ref 3–12)
Neutro Abs: 5 10*3/uL (ref 1.7–7.7)
Neutrophils Relative %: 83 % — ABNORMAL HIGH (ref 43–77)

## 2010-10-25 LAB — URINE MICROSCOPIC-ADD ON

## 2010-10-25 LAB — SEDIMENTATION RATE: Sed Rate: 0 mm/hr (ref 0–22)

## 2010-10-25 LAB — POCT PREGNANCY, URINE: Preg Test, Ur: NEGATIVE

## 2010-10-30 LAB — CBC
HCT: 40.9 % (ref 36.0–46.0)
Hemoglobin: 13.6 g/dL (ref 12.0–15.0)
MCHC: 33.3 g/dL (ref 30.0–36.0)
MCV: 84.8 fL (ref 78.0–100.0)
Platelets: 135 10*3/uL — ABNORMAL LOW (ref 150–400)
RBC: 4.82 MIL/uL (ref 3.87–5.11)
RDW: 14.1 % (ref 11.5–15.5)
WBC: 4.7 10*3/uL (ref 4.0–10.5)

## 2010-10-30 LAB — COMPREHENSIVE METABOLIC PANEL
ALT: 51 U/L — ABNORMAL HIGH (ref 0–35)
AST: 26 U/L (ref 0–37)
Albumin: 3.4 g/dL — ABNORMAL LOW (ref 3.5–5.2)
Alkaline Phosphatase: 120 U/L — ABNORMAL HIGH (ref 39–117)
BUN: 20 mg/dL (ref 6–23)
CO2: 24 mEq/L (ref 19–32)
Calcium: 8.9 mg/dL (ref 8.4–10.5)
Chloride: 108 mEq/L (ref 96–112)
Creatinine, Ser: 1.24 mg/dL — ABNORMAL HIGH (ref 0.4–1.2)
GFR calc Af Amer: >60 mL/min (ref >60)
GFR calc non Af Amer: 50 mL/min — ABNORMAL LOW (ref >60)
Glucose, Bld: 90 mg/dL (ref 70–99)
Potassium: 4.2 mEq/L (ref 3.5–5.1)
Sodium: 136 mEq/L (ref 135–145)
Total Bilirubin: 0.6 mg/dL (ref 0.3–1.2)
Total Protein: 5.8 g/dL — ABNORMAL LOW (ref 6.0–8.3)

## 2010-10-30 LAB — TACROLIMUS LEVEL: Tacrolimus (FK506) - LabCorp: 8.5 ng/mL

## 2010-11-02 LAB — CBC
HCT: 42 % (ref 36.0–46.0)
Hemoglobin: 14 g/dL (ref 12.0–15.0)
MCHC: 33.2 g/dL (ref 30.0–36.0)
MCV: 87.1 fL (ref 78.0–100.0)
Platelets: 169 10*3/uL (ref 150–400)
RBC: 4.83 MIL/uL (ref 3.87–5.11)
RDW: 13.5 % (ref 11.5–15.5)
WBC: 9.9 10*3/uL (ref 4.0–10.5)

## 2010-11-02 LAB — COMPREHENSIVE METABOLIC PANEL
ALT: 21 U/L (ref 0–35)
AST: 22 U/L (ref 0–37)
Albumin: 3.9 g/dL (ref 3.5–5.2)
Alkaline Phosphatase: 112 U/L (ref 39–117)
BUN: 28 mg/dL — ABNORMAL HIGH (ref 6–23)
CO2: 23 mEq/L (ref 19–32)
Calcium: 9.4 mg/dL (ref 8.4–10.5)
Chloride: 107 mEq/L (ref 96–112)
Creatinine, Ser: 1.65 mg/dL — ABNORMAL HIGH (ref 0.4–1.2)
GFR calc Af Amer: 44 mL/min — ABNORMAL LOW (ref >60)
GFR calc non Af Amer: 36 mL/min — ABNORMAL LOW (ref >60)
Glucose, Bld: 100 mg/dL — ABNORMAL HIGH (ref 70–99)
Potassium: 4.6 mEq/L (ref 3.5–5.1)
Sodium: 136 mEq/L (ref 135–145)
Total Bilirubin: 0.4 mg/dL (ref 0.3–1.2)
Total Protein: 6.7 g/dL (ref 6.0–8.3)

## 2010-11-02 LAB — HEMOCCULT GUIAC POC 1CARD (OFFICE): Fecal Occult Bld: NEGATIVE

## 2010-11-02 LAB — PREGNANCY, URINE: Preg Test, Ur: NEGATIVE

## 2010-11-02 LAB — DIFFERENTIAL
Basophils Absolute: 0.1 10*3/uL (ref 0.0–0.1)
Basophils Relative: 1 % (ref 0–1)
Eosinophils Absolute: 0.1 10*3/uL (ref 0.0–0.7)
Eosinophils Relative: 1 % (ref 0–5)
Lymphocytes Relative: 6 % — ABNORMAL LOW (ref 12–46)
Lymphs Abs: 0.6 10*3/uL — ABNORMAL LOW (ref 0.7–4.0)
Monocytes Absolute: 0.3 10*3/uL (ref 0.1–1.0)
Monocytes Relative: 3 % (ref 3–12)
Neutro Abs: 8.8 10*3/uL — ABNORMAL HIGH (ref 1.7–7.7)
Neutrophils Relative %: 90 % — ABNORMAL HIGH (ref 43–77)

## 2010-11-02 LAB — URINALYSIS, ROUTINE W REFLEX MICROSCOPIC
Bilirubin Urine: NEGATIVE
Glucose, UA: NEGATIVE mg/dL
Ketones, ur: NEGATIVE mg/dL
Nitrite: NEGATIVE
Protein, ur: 100 mg/dL — AB
Specific Gravity, Urine: 1.028 (ref 1.005–1.030)
Urobilinogen, UA: 0.2 mg/dL (ref 0.0–1.0)
pH: 5.5 (ref 5.0–8.0)

## 2010-11-02 LAB — LIPASE, BLOOD: Lipase: 38 U/L (ref 11–59)

## 2010-11-02 LAB — URINE MICROSCOPIC-ADD ON

## 2010-11-05 LAB — CULTURE, BLOOD (ROUTINE X 2)
Culture: NO GROWTH
Culture: NO GROWTH

## 2010-11-05 LAB — COMPREHENSIVE METABOLIC PANEL
ALT: 18 U/L (ref 0–35)
ALT: 28 U/L (ref 0–35)
ALT: 35 U/L (ref 0–35)
AST: 18 U/L (ref 0–37)
AST: 19 U/L (ref 0–37)
AST: 37 U/L (ref 0–37)
Albumin: 2.9 g/dL — ABNORMAL LOW (ref 3.5–5.2)
Albumin: 3.5 g/dL (ref 3.5–5.2)
Albumin: 3.6 g/dL (ref 3.5–5.2)
Alkaline Phosphatase: 104 U/L (ref 39–117)
Alkaline Phosphatase: 81 U/L (ref 39–117)
Alkaline Phosphatase: 99 U/L (ref 39–117)
BUN: 13 mg/dL (ref 6–23)
BUN: 18 mg/dL (ref 6–23)
BUN: 24 mg/dL — ABNORMAL HIGH (ref 6–23)
CO2: 20 mEq/L (ref 19–32)
CO2: 21 mEq/L (ref 19–32)
CO2: 23 mEq/L (ref 19–32)
Calcium: 8.3 mg/dL — ABNORMAL LOW (ref 8.4–10.5)
Calcium: 8.5 mg/dL (ref 8.4–10.5)
Calcium: 9.3 mg/dL (ref 8.4–10.5)
Chloride: 106 mEq/L (ref 96–112)
Chloride: 107 mEq/L (ref 96–112)
Chloride: 111 mEq/L (ref 96–112)
Creatinine, Ser: 1.26 mg/dL — ABNORMAL HIGH (ref 0.4–1.2)
Creatinine, Ser: 1.34 mg/dL — ABNORMAL HIGH (ref 0.4–1.2)
Creatinine, Ser: 1.42 mg/dL — ABNORMAL HIGH (ref 0.4–1.2)
GFR calc Af Amer: 52 mL/min — ABNORMAL LOW (ref >60)
GFR calc Af Amer: 56 mL/min — ABNORMAL LOW (ref >60)
GFR calc Af Amer: 60 mL/min — ABNORMAL LOW (ref >60)
GFR calc non Af Amer: 43 mL/min — ABNORMAL LOW (ref >60)
GFR calc non Af Amer: 46 mL/min — ABNORMAL LOW (ref >60)
GFR calc non Af Amer: 50 mL/min — ABNORMAL LOW (ref >60)
Glucose, Bld: 103 mg/dL — ABNORMAL HIGH (ref 70–99)
Glucose, Bld: 90 mg/dL (ref 70–99)
Glucose, Bld: 98 mg/dL (ref 70–99)
Potassium: 4.2 mEq/L (ref 3.5–5.1)
Potassium: 4.3 mEq/L (ref 3.5–5.1)
Potassium: 4.4 mEq/L (ref 3.5–5.1)
Sodium: 137 mEq/L (ref 135–145)
Sodium: 139 mEq/L (ref 135–145)
Sodium: 139 mEq/L (ref 135–145)
Total Bilirubin: 0.3 mg/dL (ref 0.3–1.2)
Total Bilirubin: 0.6 mg/dL (ref 0.3–1.2)
Total Bilirubin: 0.7 mg/dL (ref 0.3–1.2)
Total Protein: 5.9 g/dL — ABNORMAL LOW (ref 6.0–8.3)
Total Protein: 6.6 g/dL (ref 6.0–8.3)
Total Protein: 6.9 g/dL (ref 6.0–8.3)

## 2010-11-05 LAB — CBC
HCT: 37.9 % (ref 36.0–46.0)
HCT: 41.2 % (ref 36.0–46.0)
HCT: 41.2 % (ref 36.0–46.0)
HCT: 44.2 % (ref 36.0–46.0)
HCT: 47.5 % — ABNORMAL HIGH (ref 36.0–46.0)
Hemoglobin: 12.6 g/dL (ref 12.0–15.0)
Hemoglobin: 13.9 g/dL (ref 12.0–15.0)
Hemoglobin: 14.2 g/dL (ref 12.0–15.0)
Hemoglobin: 14.7 g/dL (ref 12.0–15.0)
Hemoglobin: 15.6 g/dL — ABNORMAL HIGH (ref 12.0–15.0)
MCHC: 32.8 g/dL (ref 30.0–36.0)
MCHC: 33.2 g/dL (ref 30.0–36.0)
MCHC: 33.4 g/dL (ref 30.0–36.0)
MCHC: 33.8 g/dL (ref 30.0–36.0)
MCHC: 34.4 g/dL (ref 30.0–36.0)
MCV: 86.5 fL (ref 78.0–100.0)
MCV: 86.8 fL (ref 78.0–100.0)
MCV: 87.1 fL (ref 78.0–100.0)
MCV: 87.5 fL (ref 78.0–100.0)
MCV: 87.6 fL (ref 78.0–100.0)
Platelets: 127 10*3/uL — ABNORMAL LOW (ref 150–400)
Platelets: 170 10*3/uL (ref 150–400)
Platelets: 199 10*3/uL (ref 150–400)
Platelets: 218 10*3/uL (ref 150–400)
RBC: 4.33 MIL/uL (ref 3.87–5.11)
RBC: 4.73 MIL/uL (ref 3.87–5.11)
RBC: 4.75 MIL/uL (ref 3.87–5.11)
RBC: 5.11 MIL/uL (ref 3.87–5.11)
RBC: 5.43 MIL/uL — ABNORMAL HIGH (ref 3.87–5.11)
RDW: 13.8 % (ref 11.5–15.5)
RDW: 13.9 % (ref 11.5–15.5)
RDW: 13.9 % (ref 11.5–15.5)
RDW: 14 % (ref 11.5–15.5)
RDW: 14.1 % (ref 11.5–15.5)
WBC: 12.1 10*3/uL — ABNORMAL HIGH (ref 4.0–10.5)
WBC: 5.9 10*3/uL (ref 4.0–10.5)
WBC: 6.2 10*3/uL (ref 4.0–10.5)
WBC: 7.5 10*3/uL (ref 4.0–10.5)
WBC: 8.7 10*3/uL (ref 4.0–10.5)

## 2010-11-05 LAB — POCT I-STAT, CHEM 8
BUN: 21 mg/dL (ref 6–23)
Calcium, Ion: 1.14 mmol/L (ref 1.12–1.32)
Chloride: 108 mEq/L (ref 96–112)
Creatinine, Ser: 1.4 mg/dL — ABNORMAL HIGH (ref 0.4–1.2)
Glucose, Bld: 93 mg/dL (ref 70–99)
HCT: 43 % (ref 36.0–46.0)
Hemoglobin: 14.6 g/dL (ref 12.0–15.0)
Potassium: 3.9 mEq/L (ref 3.5–5.1)
Sodium: 137 mEq/L (ref 135–145)
TCO2: 21 mmol/L (ref 0–100)

## 2010-11-05 LAB — ANAEROBIC CULTURE: Gram Stain: NONE SEEN

## 2010-11-05 LAB — GLUCOSE, CAPILLARY: Glucose-Capillary: 102 mg/dL — ABNORMAL HIGH (ref 70–99)

## 2010-11-05 LAB — DIC (DISSEMINATED INTRAVASCULAR COAGULATION)PANEL
D-Dimer, Quant: 0.8 ug/mL-FEU — ABNORMAL HIGH (ref 0.00–0.48)
Fibrinogen: 663 mg/dL — ABNORMAL HIGH (ref 204–475)
INR: 1.04 (ref 0.00–1.49)
Platelets: 114 10*3/uL — ABNORMAL LOW (ref 150–400)
Prothrombin Time: 13.5 seconds (ref 11.6–15.2)
Smear Review: NONE SEEN
aPTT: 28 seconds (ref 24–37)

## 2010-11-05 LAB — BASIC METABOLIC PANEL
BUN: 15 mg/dL (ref 6–23)
BUN: 18 mg/dL (ref 6–23)
CO2: 21 mEq/L (ref 19–32)
CO2: 24 mEq/L (ref 19–32)
Calcium: 9 mg/dL (ref 8.4–10.5)
Calcium: 9.5 mg/dL (ref 8.4–10.5)
Chloride: 105 mEq/L (ref 96–112)
Chloride: 105 mEq/L (ref 96–112)
Creatinine, Ser: 1.23 mg/dL — ABNORMAL HIGH (ref 0.4–1.2)
Creatinine, Ser: 1.4 mg/dL — ABNORMAL HIGH (ref 0.4–1.2)
GFR calc Af Amer: 53 mL/min — ABNORMAL LOW (ref >60)
GFR calc Af Amer: >60 mL/min (ref >60)
GFR calc non Af Amer: 44 mL/min — ABNORMAL LOW (ref >60)
GFR calc non Af Amer: 51 mL/min — ABNORMAL LOW (ref >60)
Glucose, Bld: 80 mg/dL (ref 70–99)
Glucose, Bld: 88 mg/dL (ref 70–99)
Potassium: 4.5 mEq/L (ref 3.5–5.1)
Potassium: 4.6 mEq/L (ref 3.5–5.1)
Sodium: 137 mEq/L (ref 135–145)
Sodium: 137 mEq/L (ref 135–145)

## 2010-11-05 LAB — DIFFERENTIAL
Basophils Absolute: 0 10*3/uL (ref 0.0–0.1)
Basophils Absolute: 0 10*3/uL (ref 0.0–0.1)
Basophils Relative: 0 % (ref 0–1)
Basophils Relative: 0 % (ref 0–1)
Eosinophils Absolute: 0 10*3/uL (ref 0.0–0.7)
Eosinophils Absolute: 0.1 10*3/uL (ref 0.0–0.7)
Eosinophils Relative: 0 % (ref 0–5)
Eosinophils Relative: 1 % (ref 0–5)
Lymphocytes Relative: 18 % (ref 12–46)
Lymphocytes Relative: 4 % — ABNORMAL LOW (ref 12–46)
Lymphs Abs: 0.4 10*3/uL — ABNORMAL LOW (ref 0.7–4.0)
Lymphs Abs: 1 10*3/uL (ref 0.7–4.0)
Monocytes Absolute: 0.3 10*3/uL (ref 0.1–1.0)
Monocytes Absolute: 0.5 10*3/uL (ref 0.1–1.0)
Monocytes Relative: 3 % (ref 3–12)
Monocytes Relative: 9 % (ref 3–12)
Neutro Abs: 4.2 10*3/uL (ref 1.7–7.7)
Neutro Abs: 8 10*3/uL — ABNORMAL HIGH (ref 1.7–7.7)
Neutrophils Relative %: 72 % (ref 43–77)
Neutrophils Relative %: 92 % — ABNORMAL HIGH (ref 43–77)

## 2010-11-05 LAB — WOUND CULTURE: Culture: NO GROWTH

## 2010-11-05 LAB — TECHNOLOGIST SMEAR REVIEW

## 2010-11-05 LAB — LACTATE DEHYDROGENASE: LDH: 245 U/L (ref 94–250)

## 2010-11-05 LAB — CULTURE, ROUTINE-ABSCESS: Gram Stain: NONE SEEN

## 2010-11-05 LAB — HEMOGLOBIN A1C
Hgb A1c MFr Bld: 5.5 % (ref 4.6–6.1)
Mean Plasma Glucose: 111 mg/dL

## 2010-11-05 LAB — GRAM STAIN

## 2010-11-14 ENCOUNTER — Encounter: Payer: Self-pay | Admitting: *Deleted

## 2010-11-18 LAB — URINALYSIS, ROUTINE W REFLEX MICROSCOPIC
Bilirubin Urine: NEGATIVE
Glucose, UA: NEGATIVE mg/dL
Hgb urine dipstick: NEGATIVE
Ketones, ur: NEGATIVE mg/dL
Leukocytes, UA: NEGATIVE
Nitrite: NEGATIVE
Protein, ur: >300 mg/dL — AB
Specific Gravity, Urine: 1.029 (ref 1.005–1.030)
Urobilinogen, UA: 1 mg/dL (ref 0.0–1.0)
pH: 5.5 (ref 5.0–8.0)

## 2010-11-18 LAB — URINE MICROSCOPIC-ADD ON

## 2010-11-18 LAB — POCT I-STAT, CHEM 8
BUN: 20 mg/dL (ref 6–23)
Calcium, Ion: 1.18 mmol/L (ref 1.12–1.32)
Chloride: 113 mEq/L — ABNORMAL HIGH (ref 96–112)
Creatinine, Ser: 1.3 mg/dL — ABNORMAL HIGH (ref 0.4–1.2)
Glucose, Bld: 78 mg/dL (ref 70–99)
HCT: 43 % (ref 36.0–46.0)
Hemoglobin: 14.6 g/dL (ref 12.0–15.0)
Potassium: 4.4 mEq/L (ref 3.5–5.1)
Sodium: 142 mEq/L (ref 135–145)
TCO2: 19 mmol/L (ref 0–100)

## 2010-11-21 LAB — CBC
HCT: 35 % — ABNORMAL LOW (ref 36.0–46.0)
HCT: 35.2 % — ABNORMAL LOW (ref 36.0–46.0)
HCT: 37.8 % (ref 36.0–46.0)
Hemoglobin: 11.9 g/dL — ABNORMAL LOW (ref 12.0–15.0)
Hemoglobin: 11.9 g/dL — ABNORMAL LOW (ref 12.0–15.0)
Hemoglobin: 12.7 g/dL (ref 12.0–15.0)
MCHC: 33.5 g/dL (ref 30.0–36.0)
MCHC: 34 g/dL (ref 30.0–36.0)
MCHC: 34.1 g/dL (ref 30.0–36.0)
MCV: 91.3 fL (ref 78.0–100.0)
MCV: 91.5 fL (ref 78.0–100.0)
MCV: 91.5 fL (ref 78.0–100.0)
Platelets: 105 10*3/uL — ABNORMAL LOW (ref 150–400)
Platelets: 107 10*3/uL — ABNORMAL LOW (ref 150–400)
Platelets: 122 10*3/uL — ABNORMAL LOW (ref 150–400)
RBC: 3.83 MIL/uL — ABNORMAL LOW (ref 3.87–5.11)
RBC: 3.84 MIL/uL — ABNORMAL LOW (ref 3.87–5.11)
RBC: 4.14 MIL/uL (ref 3.87–5.11)
RDW: 14.6 % (ref 11.5–15.5)
RDW: 14.7 % (ref 11.5–15.5)
RDW: 15 % (ref 11.5–15.5)
WBC: 5.1 10*3/uL (ref 4.0–10.5)
WBC: 5.6 10*3/uL (ref 4.0–10.5)
WBC: 6.4 10*3/uL (ref 4.0–10.5)

## 2010-11-21 LAB — D-DIMER, QUANTITATIVE: D-Dimer, Quant: 1.1 ug/mL-FEU — ABNORMAL HIGH (ref 0.00–0.48)

## 2010-11-21 LAB — PROTIME-INR
INR: 1.1 (ref 0.00–1.49)
Prothrombin Time: 14.5 seconds (ref 11.6–15.2)

## 2010-11-21 LAB — RENAL FUNCTION PANEL
Albumin: 2.8 g/dL — ABNORMAL LOW (ref 3.5–5.2)
BUN: 41 mg/dL — ABNORMAL HIGH (ref 6–23)
CO2: 21 mEq/L (ref 19–32)
Calcium: 8.6 mg/dL (ref 8.4–10.5)
Chloride: 105 mEq/L (ref 96–112)
Creatinine, Ser: 9.64 mg/dL — ABNORMAL HIGH (ref 0.4–1.2)
GFR calc Af Amer: 6 mL/min — ABNORMAL LOW (ref >60)
GFR calc non Af Amer: 5 mL/min — ABNORMAL LOW (ref >60)
Glucose, Bld: 139 mg/dL — ABNORMAL HIGH (ref 70–99)
Phosphorus: 7.7 mg/dL — ABNORMAL HIGH (ref 2.3–4.6)
Potassium: 4.7 mEq/L (ref 3.5–5.1)
Sodium: 137 mEq/L (ref 135–145)

## 2010-11-21 LAB — URINE CULTURE: Colony Count: 25000

## 2010-11-21 LAB — COMPREHENSIVE METABOLIC PANEL
ALT: 27 U/L (ref 0–35)
AST: 44 U/L — ABNORMAL HIGH (ref 0–37)
Albumin: 2.9 g/dL — ABNORMAL LOW (ref 3.5–5.2)
Alkaline Phosphatase: 155 U/L — ABNORMAL HIGH (ref 39–117)
BUN: 51 mg/dL — ABNORMAL HIGH (ref 6–23)
CO2: 22 mEq/L (ref 19–32)
Calcium: 8.3 mg/dL — ABNORMAL LOW (ref 8.4–10.5)
Chloride: 102 mEq/L (ref 96–112)
Creatinine, Ser: 9.69 mg/dL — ABNORMAL HIGH (ref 0.4–1.2)
GFR calc Af Amer: 6 mL/min — ABNORMAL LOW (ref >60)
GFR calc non Af Amer: 5 mL/min — ABNORMAL LOW (ref >60)
Glucose, Bld: 107 mg/dL — ABNORMAL HIGH (ref 70–99)
Potassium: 5.3 mEq/L — ABNORMAL HIGH (ref 3.5–5.1)
Sodium: 139 mEq/L (ref 135–145)
Total Bilirubin: 0.2 mg/dL — ABNORMAL LOW (ref 0.3–1.2)
Total Protein: 5.7 g/dL — ABNORMAL LOW (ref 6.0–8.3)

## 2010-11-21 LAB — POCT I-STAT, CHEM 8
BUN: 50 mg/dL — ABNORMAL HIGH (ref 6–23)
Calcium, Ion: 1.1 mmol/L — ABNORMAL LOW (ref 1.12–1.32)
Chloride: 104 mEq/L (ref 96–112)
Creatinine, Ser: 10.2 mg/dL — ABNORMAL HIGH (ref 0.4–1.2)
Glucose, Bld: 96 mg/dL (ref 70–99)
HCT: 39 % (ref 36.0–46.0)
Hemoglobin: 13.3 g/dL (ref 12.0–15.0)
Potassium: 5.5 mEq/L — ABNORMAL HIGH (ref 3.5–5.1)
Sodium: 137 mEq/L (ref 135–145)
TCO2: 22 mmol/L (ref 0–100)

## 2010-11-21 LAB — URINALYSIS, ROUTINE W REFLEX MICROSCOPIC
Bilirubin Urine: NEGATIVE
Glucose, UA: 100 mg/dL — AB
Ketones, ur: NEGATIVE mg/dL
Leukocytes, UA: NEGATIVE
Nitrite: NEGATIVE
Protein, ur: 100 mg/dL — AB
Specific Gravity, Urine: 1.011 (ref 1.005–1.030)
Urobilinogen, UA: 0.2 mg/dL (ref 0.0–1.0)
pH: 7.5 (ref 5.0–8.0)

## 2010-11-21 LAB — HEPATIC FUNCTION PANEL
ALT: 18 U/L (ref 0–35)
AST: 67 U/L — ABNORMAL HIGH (ref 0–37)
Albumin: 3 g/dL — ABNORMAL LOW (ref 3.5–5.2)
Alkaline Phosphatase: 132 U/L — ABNORMAL HIGH (ref 39–117)
Bilirubin, Direct: <0.1 mg/dL (ref 0.0–0.3)
Total Bilirubin: 0.3 mg/dL (ref 0.3–1.2)
Total Protein: 5.9 g/dL — ABNORMAL LOW (ref 6.0–8.3)

## 2010-11-21 LAB — GLUCOSE, CAPILLARY
Glucose-Capillary: 113 mg/dL — ABNORMAL HIGH (ref 70–99)
Glucose-Capillary: 54 mg/dL — ABNORMAL LOW (ref 70–99)

## 2010-11-21 LAB — POCT CARDIAC MARKERS
CKMB, poc: <1 ng/mL — ABNORMAL LOW (ref 1.0–8.0)
Myoglobin, poc: 202 ng/mL (ref 12–200)
Troponin i, poc: <0.05 ng/mL (ref 0.00–0.09)

## 2010-11-21 LAB — DIFFERENTIAL
Basophils Absolute: 0 10*3/uL (ref 0.0–0.1)
Basophils Relative: 0 % (ref 0–1)
Eosinophils Absolute: 0.2 10*3/uL (ref 0.0–0.7)
Eosinophils Relative: 3 % (ref 0–5)
Lymphocytes Relative: 22 % (ref 12–46)
Lymphs Abs: 1.2 10*3/uL (ref 0.7–4.0)
Monocytes Absolute: 0.4 10*3/uL (ref 0.1–1.0)
Monocytes Relative: 7 % (ref 3–12)
Neutro Abs: 3.7 10*3/uL (ref 1.7–7.7)
Neutrophils Relative %: 67 % (ref 43–77)

## 2010-11-21 LAB — LIPID PANEL
Cholesterol: 134 mg/dL (ref 0–200)
HDL: 36 mg/dL — ABNORMAL LOW (ref >39)
LDL Cholesterol: 84 mg/dL (ref 0–99)
Total CHOL/HDL Ratio: 3.7 RATIO
Triglycerides: 68 mg/dL (ref <150)
VLDL: 14 mg/dL (ref 0–40)

## 2010-11-21 LAB — LIPASE, BLOOD: Lipase: 23 U/L (ref 11–59)

## 2010-11-21 LAB — BASIC METABOLIC PANEL
BUN: 31 mg/dL — ABNORMAL HIGH (ref 6–23)
CO2: 23 mEq/L (ref 19–32)
Calcium: 9.1 mg/dL (ref 8.4–10.5)
Chloride: 106 mEq/L (ref 96–112)
Creatinine, Ser: 7.02 mg/dL — ABNORMAL HIGH (ref 0.4–1.2)
GFR calc Af Amer: 8 mL/min — ABNORMAL LOW (ref >60)
GFR calc non Af Amer: 7 mL/min — ABNORMAL LOW (ref >60)
Glucose, Bld: 80 mg/dL (ref 70–99)
Potassium: 4.8 mEq/L (ref 3.5–5.1)
Sodium: 140 mEq/L (ref 135–145)

## 2010-11-21 LAB — APTT: aPTT: 29 seconds (ref 24–37)

## 2010-11-21 LAB — URINE MICROSCOPIC-ADD ON

## 2010-11-21 LAB — MAGNESIUM: Magnesium: 2.4 mg/dL (ref 1.5–2.5)

## 2010-11-21 LAB — BRAIN NATRIURETIC PEPTIDE: Pro B Natriuretic peptide (BNP): 3186 pg/mL — ABNORMAL HIGH (ref 0.0–100.0)

## 2010-11-21 LAB — TROPONIN I
Troponin I: 0.03 ng/mL (ref 0.00–0.06)
Troponin I: 0.09 ng/mL — ABNORMAL HIGH (ref 0.00–0.06)

## 2010-11-21 LAB — PHOSPHORUS: Phosphorus: 8.9 mg/dL — ABNORMAL HIGH (ref 2.3–4.6)

## 2010-11-21 LAB — AMYLASE: Amylase: 149 U/L — ABNORMAL HIGH (ref 27–131)

## 2010-11-21 LAB — CK TOTAL AND CKMB (NOT AT ARMC)
CK, MB: 1.5 ng/mL (ref 0.3–4.0)
CK, MB: 1.8 ng/mL (ref 0.3–4.0)
Relative Index: INVALID (ref 0.0–2.5)
Relative Index: INVALID (ref 0.0–2.5)
Total CK: 46 U/L (ref 7–177)
Total CK: 53 U/L (ref 7–177)

## 2010-11-21 LAB — TSH: TSH: 2.267 u[IU]/mL (ref 0.350–4.500)

## 2010-11-22 LAB — BASIC METABOLIC PANEL
BUN: 72 mg/dL — ABNORMAL HIGH (ref 6–23)
BUN: 73 mg/dL — ABNORMAL HIGH (ref 6–23)
CO2: 17 mEq/L — ABNORMAL LOW (ref 19–32)
CO2: 17 mEq/L — ABNORMAL LOW (ref 19–32)
Calcium: 8.8 mg/dL (ref 8.4–10.5)
Calcium: 9 mg/dL (ref 8.4–10.5)
Chloride: 100 mEq/L (ref 96–112)
Chloride: 101 mEq/L (ref 96–112)
Creatinine, Ser: 11.17 mg/dL — ABNORMAL HIGH (ref 0.4–1.2)
Creatinine, Ser: 11.35 mg/dL — ABNORMAL HIGH (ref 0.4–1.2)
GFR calc Af Amer: 5 mL/min — ABNORMAL LOW (ref >60)
GFR calc Af Amer: 5 mL/min — ABNORMAL LOW (ref >60)
GFR calc non Af Amer: 4 mL/min — ABNORMAL LOW (ref >60)
GFR calc non Af Amer: 4 mL/min — ABNORMAL LOW (ref >60)
Glucose, Bld: 77 mg/dL (ref 70–99)
Glucose, Bld: 81 mg/dL (ref 70–99)
Potassium: 6.8 mEq/L (ref 3.5–5.1)
Potassium: >7.5 mEq/L (ref 3.5–5.1)
Sodium: 133 mEq/L — ABNORMAL LOW (ref 135–145)
Sodium: 133 mEq/L — ABNORMAL LOW (ref 135–145)

## 2010-11-22 LAB — CBC
HCT: 35.4 % — ABNORMAL LOW (ref 36.0–46.0)
Hemoglobin: 12 g/dL (ref 12.0–15.0)
MCHC: 33.8 g/dL (ref 30.0–36.0)
MCV: 93 fL (ref 78.0–100.0)
Platelets: 132 10*3/uL — ABNORMAL LOW (ref 150–400)
RBC: 3.81 MIL/uL — ABNORMAL LOW (ref 3.87–5.11)
RDW: 15.8 % — ABNORMAL HIGH (ref 11.5–15.5)
WBC: 7.6 10*3/uL (ref 4.0–10.5)

## 2010-11-22 LAB — CATH TIP CULTURE: Culture: NO GROWTH

## 2010-11-22 LAB — POCT I-STAT 4, (NA,K, GLUC, HGB,HCT)
Glucose, Bld: 101 mg/dL — ABNORMAL HIGH (ref 70–99)
Glucose, Bld: 80 mg/dL (ref 70–99)
Glucose, Bld: 81 mg/dL (ref 70–99)
HCT: 37 % (ref 36.0–46.0)
HCT: 39 % (ref 36.0–46.0)
HCT: 54 % — ABNORMAL HIGH (ref 36.0–46.0)
Hemoglobin: 12.6 g/dL (ref 12.0–15.0)
Hemoglobin: 13.3 g/dL (ref 12.0–15.0)
Hemoglobin: 18.4 g/dL — ABNORMAL HIGH (ref 12.0–15.0)
Potassium: 3.2 mEq/L — ABNORMAL LOW (ref 3.5–5.1)
Potassium: 4.9 mEq/L (ref 3.5–5.1)
Potassium: 5.3 mEq/L — ABNORMAL HIGH (ref 3.5–5.1)
Sodium: 132 mEq/L — ABNORMAL LOW (ref 135–145)
Sodium: 135 mEq/L (ref 135–145)
Sodium: 145 mEq/L (ref 135–145)

## 2010-11-22 LAB — PROTIME-INR
INR: 1.1 (ref 0.00–1.49)
Prothrombin Time: 15 seconds (ref 11.6–15.2)

## 2010-11-22 LAB — GLUCOSE, CAPILLARY: Glucose-Capillary: 61 mg/dL — ABNORMAL LOW (ref 70–99)

## 2010-11-22 LAB — APTT: aPTT: 174 seconds — ABNORMAL HIGH (ref 24–37)

## 2010-12-03 IMAGING — CR DG CHEST 1V PORT
1 series · 1 of 1 positions shown · non-contrast
Comparison: 07/28/2008

CLINICAL DATA: End-stage renal disease, catheter insertion

PORTABLE CHEST - 1 VIEW

[view not recorded]
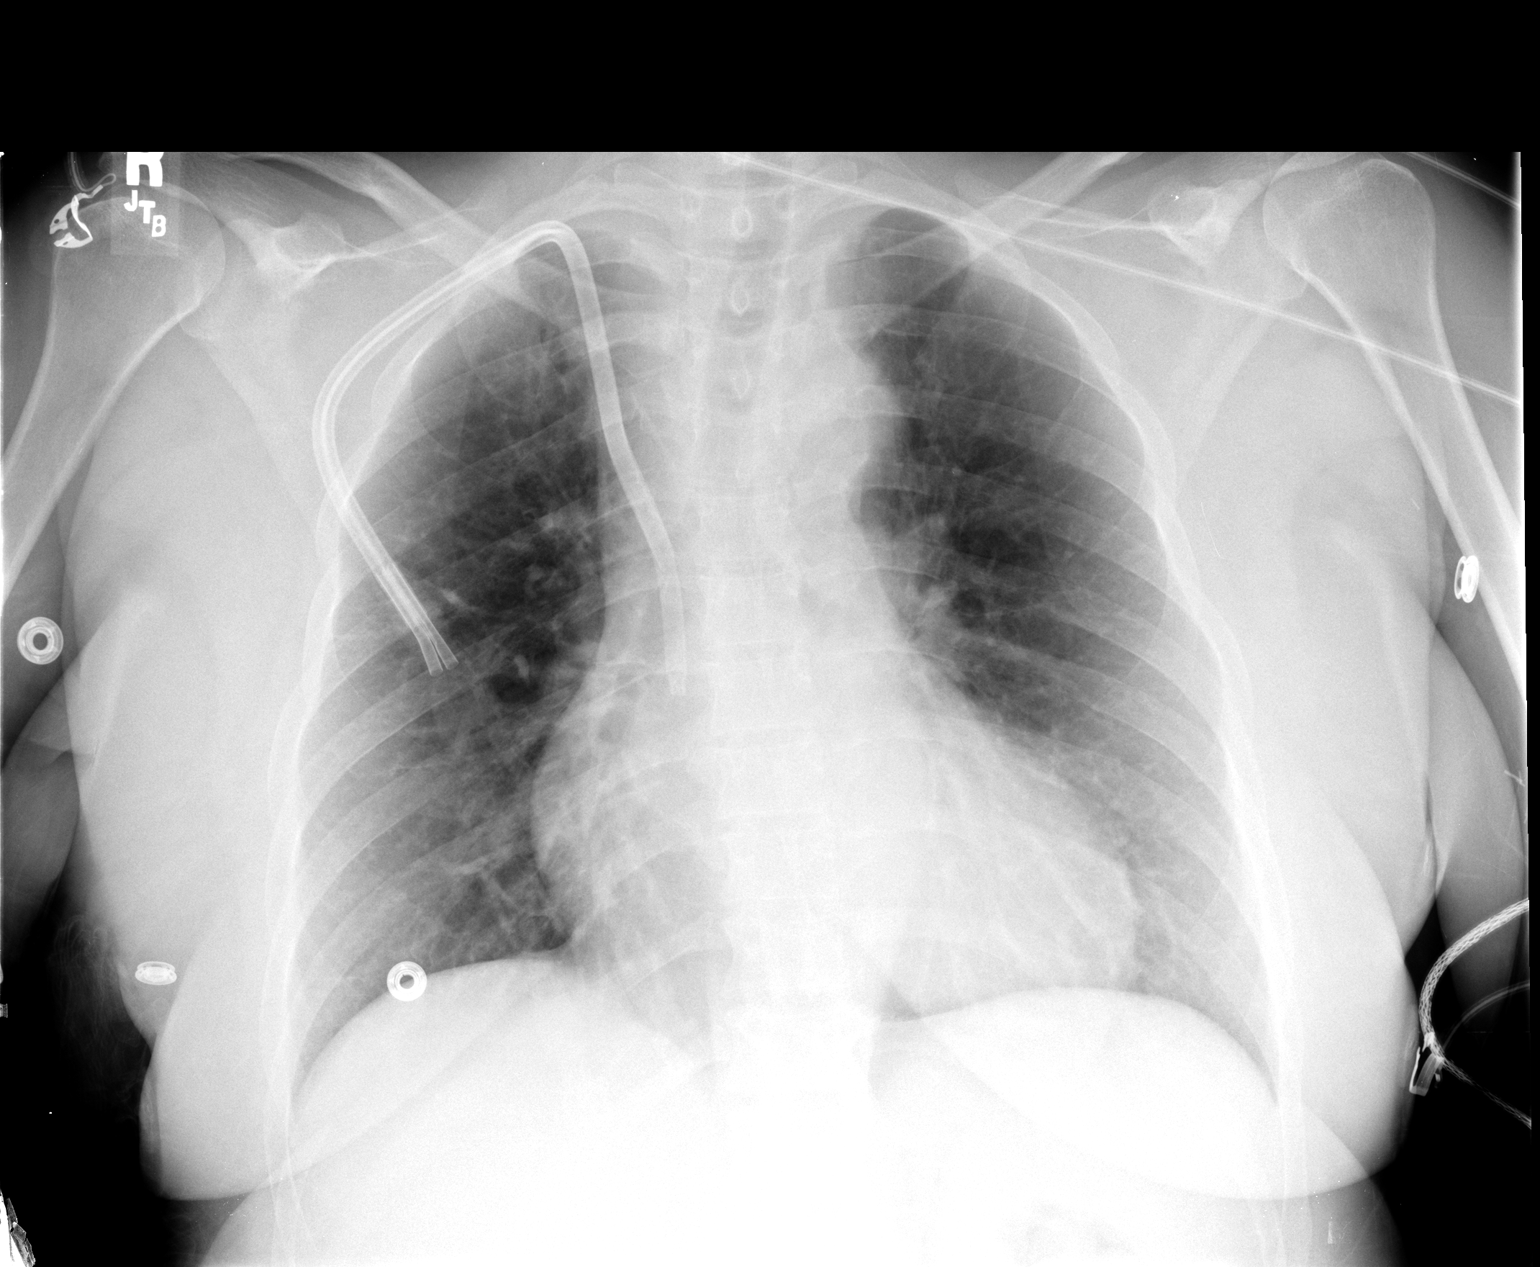

[1 of 1 positions shown; findings below may reference images not displayed]

FINDINGS: New right IJ dialysis catheter tips project over the SVC
/ RA junction. Slight curvature of the distal catheter tips along
the SVC region suspicious for underlying SVC stenosis. Mild
cardiomegaly and vascular congestion.  No pneumothorax or effusion.
IMPRESSION: New right IJ dialysis catheter tips at the level of the SVC RA
junction.  No pneumothorax

## 2010-12-25 NOTE — Discharge Summary (Signed)
NAMEKETINA, MARS NO.:  [id number]   MEDICAL RECORD NO.:  [id number]          PATIENT TYPE:  INP   LOCATION:  6736                         FACILITY:  [facility]   PHYSICIAN:  Terrial Rhodes, M.D.DATE OF BIRTH:  [birth year]   DATE OF ADMISSION:  04/06/2008  DATE OF DISCHARGE:  04/09/2008                               DISCHARGE SUMMARY   DISCHARGE DIAGNOSES:  1. Fever secondary to catheter site infection.  2. End-stage renal disease; on Monday, Wednesday, Friday hemodialysis.  3. Bradycardia likely related to hyperkalemia and overload.  4. Liver dysfunction.  5. Anemia.  6. Hyperparathyroidism.  7. Hypertension.   DISCHARGE MEDICATIONS:  1. Enalapril 5 mg by mouth twice a day.  2. Hectorol 2 mcg with hemodialysis.  3. Erythropoietin 6200 units with hemodialysis.  4. Zocor 20 mg by mouth once daily.  5. Oxycodone 5 mg 1-2 tablets as required for pain.  6. Ancef 2 g IV after each dialysis for 3 weeks.   DISPOSITION/FOLLOWUP:  The patient is to continue with Monday,  Wednesday, Friday hemodialysis at the [facility].  Also,  please note that the patient will need a repeat LFT in about 1 week's  time to check for his liver dysfunction.  As for his hemodialysis  orders, no change has been made.   PROCEDURES AND STUDIES DURING THIS HOSPITAL ADMISSION:  1. Chest x-ray, April 07, 2008.  Impression, dialysis catheter in      place.  No pneumothorax evident.  No acute process seen.  2. Ultrasound and fluoroscopic-guided placement of a nontunneled      dialysis catheter on April 08, 2008.  3. A removal of right internal jugular tunneled dialysis catheter on      April 07, 2008.   CONSULTS:  No special consults were requested during this hospital  admission.   BRIEF HISTORY OF PRESENT ILLNESS AND PHYSICAL:  Ms. Bosque is a 35-  year-old lady with past medical history of end-stage renal disease,  hypertension, hyperlipidemia, status post right  IJ Diatek done on March 02, 2008.  She was getting her hemodialysis for the past 1 month.  She  underwent dialysis on the day of admission, at which time, she  complained of neck pain on the right side then with chills and sweating.  There is no history of fever, but she was found to have a temperature of  101.3 in the ED.  Denied any nausea, vomiting, diarrhea, chest pain,  shortness of breath, or rashes.  She was subsequently admitted for  suspected infection related to the [facility] catheter.   ADMISSION PHYSICAL EXAMINATION:  VITAL SIGNS:  Temperature 101.3, pulse  87, respiratory rate 18, blood pressure 122/100, oxygen saturation 100%  on room air.  GENERAL:  In minimal distress secondary to pain.  HEAD, EYE, ENT:  Normocephalic, atraumatic.  EOMI, PERRL.  NECK:  Supple with a right IJ Diatek in place.  Catheter site is red,  mildly swollen, and tender with no discharge.  CARDIOVASCULAR:  Normal heart sounds.  No murmur, rubs, or gallop.  LUNGS:  Clear to auscultation bilaterally.  ABDOMEN:  Soft, nontender,  nondistended.  NEUROLOGIC:  Alert and oriented x3.  Cranial nerves II through XII  intact.  Grossly nonfocal.   ADMISSION LABORATORY DATA:  Hemoglobin 12.9, WBC 7.1, platelet 104.  Sodium of 133, potassium 3.9, chloride 107, BUN 14, creatinine 3.7, and  blood glucose 92.   HOSPITAL COURSE BY PROBLEM:  1. Fever.  This was most likely related to the catheter site      infection.  The patient was initially started on vancomycin, but      she did not tolerate vancomycin and was later switched to cefepime      for one night.  Her blood cultures subsequently grew sensitive to      Staphylococcus aureus and she was sent home on the 3 weeks' course      of antibiotics.  She is to get Cefazolin with her hemodialysis.  As      noted earlier, the old Diatek catheter was removed.  2. End-stage renal disease.  We continued with the patient's      hemodialysis.  3. Bradycardia.  This was  noted after missed hemodialysis and this was      most likely related to hyperkalemia and overload as well as to her      being on clonidine.  She was basically closely monitored and we      also checked her TSH which came back to within normal limits.  4. Liver dysfunction.  The patient had raised AST and ALT, the cause      of this is not known.  This was planned to be followed as      outpatient.  5. Anemia.  The patient's Aranesp and Venofer was continued.  6. Hyperparathyroidism.  The patient's Hectorol was continued.  7. Hypertension.  The patient was off clonidine after the bradycardia      episode.  The patient had good blood pressure control even without      medication while in the hospital.  This will have to be followed up      on outpatient basis.  She could be a good candidate for ACE      inhibitors.  8. Low platelets.  This was noted in the hospital and was in the      borderline range, could be related to medications for example the      vancomycin that she had earlier received.  This will have to be      followed up on outpatient basis.  The lowest value was 88,000 and      on the day of discharge, it was 117,000.      Zara Council, MD  Electronically Signed     ______________________________  Terrial Rhodes, M.D.    AS/MEDQ  D:  05/20/2008  T:  05/20/2008  Job:  [id number]

## 2010-12-25 NOTE — Consult Note (Signed)
NAMECHARLITA, [name] NO.:  [id number]   MEDICAL RECORD NO.:  [id number]          PATIENT TYPE:  INP   LOCATION:  6708                         FACILITY:  [facility]   PHYSICIAN:  Di Kindle. Edilia Bo, M.D.DATE OF BIRTH:  02-Feb-1976   DATE OF CONSULTATION:  07/28/2008  DATE OF DISCHARGE:                                 CONSULTATION   REASON FOR CONSULTATION:  Neck and chest swelling with superior  venacaval occlusion.   HISTORY:  This is a pleasant 35 year old woman who is admitted with a  chief complaint of swelling in her neck, chest, and breast x2 days.  This patient has chronic kidney disease secondary to chronic  glomerulonephritis.  She has been in a difficult access problem and has  had failed attempts in both her upper extremities.  In February of this  year, she had a left brachial artery repair with a saphenous vein graft  by Dr. Madilyn Fireman.  She was later having symptoms in the arm and underwent an  arteriogram, which showed that her graft was occluded.  She ultimately  obtained a second opinion in [place] and on July 22, 2008, she  underwent selective catheterization on the left brachial artery with  attempted crossing of the left brachial artery occluded vein graft.  This resulted in a brachial artery perforation in the upper arm which  was successfully controlled with an occlusion balloon technique.  At the  completion of the procedure the anatomy had not changed.  She had an  occluded brachial artery with 2 large collaterals providing flow to the  radial and ulnar arteries according to the report by Dr. Jacelyn Pi in  [place].  She has had no increasing pain in her left hand and her left  arm symptoms have been stable.  However, 2 days after this procedure she  began noting some swelling in her neck and chest and breast.  She  presented to the emergency department and a CT scan of the chest was  obtained, which showed an occlusion of the superior vena  cava.  Vascular  Surgery was consulted.  Of note, she has a left IJ catheter, which  traverses the superior vena cava.  Based on previous operative reports,  it appears that the right IJ appeared occluded by ultrasound.  Her only  other complaint besides the swelling currently is a headache, which she  states is new since she began dialysis tonight.   PAST MEDICAL HISTORY:  1. Significant for end-stage renal disease secondary to chronic      glomerulonephritis.  2. Hypertension.  3. Hyperlipidemia.  4. She denies any history of diabetes, history of previous myocardial      infarction, history of congestive heart failure.   PAST SURGICAL HISTORY:  1. Significant for attempting grafts in both arms, none of which had      been successful.  She is also status post appendectomy.  2. Status post cholecystectomy.  3. Status post D&C.   ALLERGIES:  VANCOMYCIN and REGLAN.   MEDICATIONS:  1. Ambien 10 mg p.o. p.r.n. at bedtime.  2. Xanax 0.5 mg p.o. p.r.n.  3. Simvastatin  20 mg p.o. daily.  4. Enalapril 10 mg p.o. b.i.d.  5. Sodium bicarbonate 650 mg p.o. t.i.d.   SOCIAL HISTORY:  She is single.  She has 3 children.  She lives in  [place].  She smokes a pack per day of cigarettes.  She denies  regular alcohol use.   FAMILY HISTORY:  She is unaware of any history of premature  cardiovascular disease.   REVIEW OF SYSTEMS:  GENERAL:  She has had no recent weight loss, weight  gain, problems with her appetite, or fever.  CARDIAC:  She has had no  chest pain, chest pressure, palpations, or arrhythmias.  PULMONARY:  She  has had no productive cough, bronchitis, asthma, or wheezing.  GI:  She  has had a recent change in her bowel habits.  There is no history of  peptic ulcer disease.  GU:  She has had no dysuria.  HEMATOLOGIC:  She  has had no bleeding problems or clotting disorders.  NEUROLOGIC:  She  complains of a headache, which just began tonight when underwent  dialysis.  She has  had no history of seizures or blackouts.  VASCULAR:  She has had no claudication or wrist pain.  There were nonhealing  ulcers.  She has had no history of DVT.  HEENT:  She has had no recent  change in her eye site or hearing.   PHYSICAL EXAMINATION:  GENERAL:  This is a pleasant 35 year old woman  who appears her stated age.  VITAL SIGNS:  Temperature is 97.4, blood pressure 198/123, and heart  rate is 68.  NECK:  Supple.  There is no cervical lymphadenopathy.  I do not detect  any carotid bruits.  She has only minimal neck swelling that I can  discern.  EXTREMITIES:  She has no significant upper extremity swelling.  She has  some mild ecchymosis in her upper arm and the left.  I cannot palpate a  radial or ulnar pulse in the left.  She has a palpable radial pulse on  the right.  The left hand, however, is pink, warm, and well perfused.  She has palpable femoral pulses and palpable pedal pulses bilaterally.  She has no significant lower extremity swelling.  However, we reviewed  her CT scan which shows an occlusion of the superior vena cava with  extensive collateral formation noted.  She also has bilateral pleural  effusions.  The radiologist also noted patchy ground glass density in  the right upper lobe likely a sequela of inflammation or infection.  There was also some concern about skin thickening involving the left  breast and subcutaneous wax draining that could be either cellulitis or  mastitis.  It was noted that her inflammatory breast cancer may have a  similar appearance.   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  I think most likely her recent swelling in her neck and chest is  related to her recent procedure in [place] in which there was  attempted cannulation of the occluded brachial vein graft and there was  perforation in the brachial artery in the upper  arm which was controlled  with the balloon occlusion.  However, I suspect this resulted in some  hematoma and ecchymosis and could explain her swelling.  I would except  this to gradually improve with time and the perfusion of her hand is  maintained.  In reviewing  their report, the perforation did not change  her anatomy, which showed a brachial artery occlusion with 2 large  collateral providing flow to the radial and ulnar arteries.  Her  circulation in the left hand is currently stable and her symptoms are  stable.  If her swelling did not improve consideration could be given to  removal of the left IJ Diatek, which would open the small channel within  the superior vena cava where the catheter traversed, but this would  require placement of a femoral Diatek catheter which is obviously less  than ideal in a 35 year old who has limited access options.  We will  follow  along.  We can consider changing her catheter if necessary; however, at  this point, I did not think this will be required and I suspect her  swelling will gradually improve with time.  May be helpful to elevate  the head of her bed 30-45 degrees.      Di Kindle. Edilia Bo, M.D.  Electronically Signed     CSD/MEDQ  D:  07/28/2008  T:  07/29/2008  Job:  [id number]   cc:    [facility].

## 2010-12-25 NOTE — Procedures (Signed)
CEPHALIC VEIN MAPPING   INDICATION:  Evaluate both cephalic veins for fistula placement.   HISTORY:  End-stage renal disease.   EXAM:   The right cephalic vein is compressible.   Diameter measurements range from 0.14 cm in the forearm to 0.34 cm in  the right upper arm.   The left cephalic vein is compressible.   Diameter measurements range from 0.09 cm in the forearm to 0.14 cm in  the upper arm.   See attached worksheet for all measurements.   IMPRESSION:  Patent bilateral cephalic veins which are of acceptable  diameter for use as a dialysis access on the right.  The left cephalic  vein is not acceptable for use as an access.   ___________________________________________  P. Liliane Bade, M.D.   DP/MEDQ  D:  02/26/2007  T:  02/27/2007  Job:  [id number]

## 2010-12-25 NOTE — Assessment & Plan Note (Signed)
OFFICE VISIT   [name], [name]  DOB:  1975-10-23                                       11/12/2007  GNFAO#:[id number]   Patient underwent repair of left brachial artery with reverse saphenous  vein, interposition graft, carried out 02/29/09 at [facility].  This graft has continued to function well.  She has had no further left  upper extremity claudication symptoms.   The incisions have healed well.  She has a 1+ left radial pulse.   Patient is apparently matched with her mother for a transplant.  I will  hold off on placing a leg graft until further instruction from Dr.  Eliott Nine.   Balinda Quails, M.D.  Electronically Signed   PGH/MEDQ  D:  11/12/2007  T:  11/13/2007  Job:  [id number]   cc:   [facility]. Eliott Nine, M.D.

## 2010-12-25 NOTE — Procedures (Signed)
VASCULAR LAB EXAM   INDICATION:  Left arm pain and claudication history, left brachial  interposition graft on 10/06/2007.   HISTORY:  Diabetes:  No.  Cardiac:  No.  Hypertension:  No.   EXAM:  Radial artery forearm systolic pressure right 148 (triphasic  Doppler signal), left 120 mmHg (monophasic Doppler arterial signal).   IMPRESSION:  1. Patent left brachial interposition vein graft.  2. Velocities increase from 86 cm/second in the proximal upper arm      portion of the graft to 151 cm/second at a focal area in the mid      upper arm portion of the graft (possible retained valve).  3. 28 mmHg radial artery gradient left greater than right.   ___________________________________________  P. Liliane Bade, M.D.   MC/MEDQ  D:  05/12/2008  T:  05/12/2008  Job:  [id number]

## 2010-12-25 NOTE — Assessment & Plan Note (Signed)
OFFICE VISIT   LEEYA, RUSCONI  DOB:  1975/09/07                                       08/18/2008  YQIHK#:[id number]   The patient has been a no show for appointments on 06/30/2008,  07/28/2008 and also today.   Balinda Quails, M.D.  Electronically Signed   PGH/MEDQ  D:  08/18/2008  T:  08/19/2008  Job:  [id number]

## 2010-12-25 NOTE — Consult Note (Signed)
[name], [name] NO.:  [id number]   MEDICAL RECORD NO.:  [id number]          PATIENT TYPE:  INP   LOCATION:  5506                         FACILITY:  [facility]   PHYSICIAN:  Rollene Rotunda, MD, FACCDATE OF BIRTH:  1976/02/24   DATE OF CONSULTATION:  11/29/2008  DATE OF DISCHARGE:                                 CONSULTATION   PRIMARY CARE PHYSICIAN:  Dr. Eliott Nine.   REASON FOR CONSULTATION:  Evaluate the patient with dyspnea.   HISTORY OF PRESENT ILLNESS:  The patient is a 35 year old white female  with a long-standing history of hypertension poorly controlled.  This  apparently is the reason for her end-stage renal disease, dialysis  dependent.  She does have a cardiac history that includes cardiomegaly.  She said she had a cardiac catheterization in January in [place] to  evaluate this.  She was told she had normal coronaries.  She has never  been told that she had heart failure or low ejection fraction.  She says  her blood pressure continues to be difficult to control.  She does  usually do relatively well and in between dialysis, she is able to take  care of her home and play with her 64 year old child.  With this she has  not had any chest pressure, neck or arm discomfort.  She has not had any  palpitation, presyncope, syncope.  She does not typically have any  dyspnea and does not have any PND or orthopnea.  However, she presented  on April 19 with dyspnea and was noted to be hypertensive, blood  pressure 216/120 in the ER.  She had a negative chest CT for pulmonary  emboli.  She did undergo dialysis.  She is breathing better now.  Blood  pressure is still slightly elevated.  We are consulted for evaluation of  this dyspnea.  Of note, her chest x-ray in the ER did not suggest volume  overload, although her BNP was 3186.  She has slightly elevated  troponin.   The patient denies any fevers or chills.  She has not had any weight  gain or swelling although  she has had some facial puffiness.   PAST MEDICAL HISTORY:  Hypertension, end-stage renal disease dialysis  dependent, secondary hyperparathyroidism, anemia of chronic disease,  hyperlipidemia, SVC occlusion, previous tobacco abuse, thrombocytopenia.   PAST SURGICAL HISTORY:  Multiple AV fistulas and a graft with multiple  percutaneous dialysis catheters apparently with recurrent infections.   ALLERGIES:  VANCOMYCIN and REGLAN.   MEDICATIONS:  1. Albuterol.  2. Aspirin.  3. Coreg 12.5 mg b.i.d.  4. Aranesp.  5. Vasotec 20 mg b.i.d.  6. Heparin.  7. Hydralazine 25 mg q.4 hours.  8. Atrovent.  9. Benefiber.  10.Nicotine patch.  11.Protonix.  12.Renal vitamin.  13.Simvastatin 9 mg daily.   SOCIAL HISTORY:  The patient has 37-year-old twins who live with her ex-  husband.  She has a 62 year old child who lives at home.  She has a 22  pack-year smoking history.  She is currently unemployed.  Lives in  [place].   FAMILY HISTORY:  Noncontributory for early coronary disease, though  she  does not know her father's history.   REVIEW OF SYSTEMS:  Positive for headache.  Otherwise as stated in the  HPI.  Negative for all other systems.   PHYSICAL EXAMINATION:  CONSTITUTIONAL:  The patient is uncomfortable  with her headache.  VITAL SIGNS:  The blood pressure 154/98, heart rate 73 and regular,  afebrile, respiratory rate 18.  HEENT:  Eyelids unremarkable.  Pupils are equal, round, and reactive to  light and accommodation.  Fundi are not visualized.  Oral mucosa  unremarkable.  NECK:  No jugular venous distension 45 degrees, carotid upstroke brisk  and symmetric, no bruits, thyromegaly.  LYMPHATICS:  No cervical, axillary, or inguinal adenopathy.  LUNGS:  Decreased breath sounds, but no wheezing or crackles.  BACK:  No costovertebral angle tenderness.  CHEST:  Dialysis catheter in place.  Otherwise unremarkable.  HEART:  PMI not displaced or sustained, S1 and S2 within normal  limits,  no S3, no S4, no murmurs.  ABDOMEN:  Flat, positive bowel sounds, normal in frequency and pitch, no  bruits, rebound, guarding.  No hepatomegaly, splenomegaly.  SKIN:  No rashes, no nodules.  EXTREMITIES:  With 2+ pulses throughout, no edema, cyanosis, clubbing.  NEURO:  Oriented to person, place, and time, cranial nerves 2-12 grossly  intact, motor grossly intact.   EKG:  Sinus rhythm, rate 68, axis within normal limits, intervals within  normal limits, no acute ST-wave changes.   LABS:  WBC 5.1, hemoglobin 0.9, sodium 39, potassium 5.3, BUN 51,  creatinine 4.69, TSH 2.267.   ASSESSMENT/PLAN:  Dyspnea.  The patient's dyspnea is related to volume  overload and hypertensive urgency.  It is not clear whether there is any  dietary indiscretion or medical noncompliance.  However, I would suggest  that we reinforce a diet, in particular salt restriction and I did this  today.  Blood pressure control is the key.  Of course, her volume will  be managed with dialysis.  I would suggest once daily antihypertensives.  She says she does not get hypotensive with dialysis, but I would need to  defer to the nephrologist to manage this.  For now, I would use  lisinopril once daily.  Beta blockers such as bisoprolol once daily  would be reasonable.  I would not use hydralazine or clonidine.  The  third drug would be amlodipine.  Again all once daily drugs.  The timing  of these could be varied to accommodate dialysis.   She has had a recent workup.  I would agree with an echocardiogram as we  do not have these.  However, I would not suspect coronary disease but  would obtain the [facility] catheterization to verify this.  We will  follow up with results of the echo and try to get her reports from  [place].      Rollene Rotunda, MD, [facility]  Electronically Signed     JH/MEDQ  D:  11/29/2008  T:  11/29/2008  Job:  [id number]   cc:   [employer]

## 2010-12-25 NOTE — Op Note (Signed)
[name], [name]           ACCOUNT NO.:  [id number]   MEDICAL RECORD NO.:  [id number]          PATIENT TYPE:  AMB   LOCATION:  SDS                          FACILITY:  [facility]   PHYSICIAN:  Di Kindle. Edilia Bo, M.D.DATE OF BIRTH:  September 28, 1975   DATE OF PROCEDURE:  05/19/2008  DATE OF DISCHARGE:                               OPERATIVE REPORT   PREOPERATIVE DIAGNOSIS:  End-stage renal disease.   POSTOPERATIVE DIAGNOSIS:  End-stage renal disease.   PROCEDURE:  Ultrasound-guided placement of left IJ 28-cm Diatek  catheter.   SURGEON:  Di Kindle. Edilia Bo, MD   ANESTHESIA:  Local with sedation technique.   The patient was taken to the operating room and sedated by anesthesia.  The ultrasound scanner was used to identify both internal jugular veins.  The right IJ could not be identified and could potentially be thrombosed  as the patient has had previous catheters present here.  The left IJ  appeared to be patent.  The neck and upper chest were prepped and draped  in the usual sterile fashion.  After the skin was infiltrated with 1%  lidocaine and under ultrasound guidance, the left IJ was cannulated.  I  had difficulty in passing the wire distally.  I had to use an end-hole  catheter to support the wire and I was able to direct an angled  Glidewire down into the right atrium.  The end-hole catheter was  advanced and a Glidewire exchanged for an Amplatz wire.  The end-hole  catheter was removed and then the tract was dilated and then the dilator  and peel-away sheath advanced over the wire.  The dilator was removed.  The catheter was threaded over the wire through the peel-away sheath  down into the right atrium and then the peel-away sheath and wire were  removed.  The exit site for the catheter was selected and the skin  anesthetized between the two areas.  The catheter was then brought  through the tunnel, cut in appropriate length and the distal ports were  attached.   Both ports withdrew easily.  We then flushed with heparinized  saline and filled with concentrated heparin.  The catheter was secured  at its exit site with a 3-0 nylon suture.  The IJ cannulation site was  closed with 4-0 subcuticular stitch.  A sterile dressing was applied.  The patient tolerated the procedure well, was transferred to the  recovery room in satisfactory condition.  All needle and sponge counts  were correct.      Di Kindle. Edilia Bo, M.D.  Electronically Signed     CSD/MEDQ  D:  05/19/2008  T:  05/19/2008  Job:  [id number]

## 2010-12-25 NOTE — Assessment & Plan Note (Signed)
OFFICE VISIT   [name], [name]  DOB:  [birth year]                                       04/09/2007  ZOXWR#:[id number]   [name] is status post creation right brachiocephalic arteriovenous  fistula March 06, 2007.  She developed steal syndrome requiring ligation  March 08, 2007.  She has 2+ right radial pulse.  Good neurologic function  in her hand.  Wound is healing well.   Return once wound is completely healed for further planning of dialysis  access.   Balinda Quails, M.D.  Electronically Signed   PGH/MEDQ  D:  04/09/2007  T:  04/11/2007  Job:  [id number]

## 2010-12-25 NOTE — Op Note (Signed)
NAMEKASSANDRA, MERIWEATHER           ACCOUNT NO.:  [id number]   MEDICAL RECORD NO.:  [id number]          PATIENT TYPE:  AMB   LOCATION:  SDS                          FACILITY:  [facility]   PHYSICIAN:  Balinda Quails, M.D.    DATE OF BIRTH:  Dec 28, 1975   DATE OF PROCEDURE:  03/06/2007  DATE OF DISCHARGE:  03/06/2007                               OPERATIVE REPORT   SURGEON:  Balinda Quails, M.D.   ASSISTANT:  Nurse.   ANESTHESIA:  Local with MAC.   PREOPERATIVE DIAGNOSIS:  Chronic renal insufficiency.   POSTOPERATIVE DIAGNOSIS:  Chronic renal insufficiency.   PROCEDURE:  Right brachial cephalic arteriovenous fistula.   CLINICAL NOTE:  [name] is a 36 year old female with chronic  renal insufficiency referred for placement of AV access for  hemodialysis.  She underwent preoperative vein mapping which revealed a  3 to 3.5 mm right cephalic vein above the antecubital fossa.  She is  brought to the operating room at this time for right brachiocephalic  arteriovenous fistula.   OPERATIVE PROCEDURE:  The patient was brought to the operating room in  stable condition.  She was placed in a supine position.  Her right arm  was prepped and draped in a sterile fashion.  The skin and subcutaneous  tissues were instilled with 1% Xylocaine.  A transverse skin incision  was made through the right antecubital fossa.  Dissection was carried  down through the subcutaneous tissue.  The cephalic vein was exposed.  This was 3 to 3.5 mm in size.  Tributaries ligated with 4-0 silk.  The  vein mobilized adequately.   Deep dissection then carried down to expose the brachial artery.  This  was a small artery 3 mm in size.  It was encircled with a vessel loop.  The patient was administered 3000 units heparin intravenously.  The  brachial artery bathed in papaverine.  The artery was then controlled  with bulldog clamps.  A longitudinal arteriotomy made.  The vein was  divided and dilated to 3.5 mm.   The vein was anastomosed end-to-side to  the brachial artery using a running 7-0 Prolene suture.  Clamps were  removed.  Excellent flow was present.  Adequate hemostasis obtained.  Sponge and instrument counts were correct.   The subcutaneous tissue was closed with running 3-0 Vicryl suture.  The  skin was closed with 4-0 Monocryl.  Dermabond was applied.  The patient  was transferred to the recovery room in stable condition.      Balinda Quails, M.D.  Electronically Signed     PGH/MEDQ  D:  03/06/2007  T:  03/07/2007  Job:  [id number]   cc:   [facility] Salvia. Eliott Nine, M.D.

## 2010-12-25 NOTE — Op Note (Signed)
NAMEXOE, HOE NO.:  [id number]   MEDICAL RECORD NO.:  [id number]          PATIENT TYPE:  EMS   LOCATION:  MAJO                         FACILITY:  [facility]   PHYSICIAN:  Quita Skye. Hart Rochester, M.D.  DATE OF BIRTH:  11/06/1975   DATE OF PROCEDURE:  03/08/2007  DATE OF DISCHARGE:                               OPERATIVE REPORT   PREOPERATIVE DIAGNOSIS:  Steal syndrome right upper extremity secondary  to recently created arteriovenous fistula.   POSTOPERATIVE DIAGNOSIS:  Steal syndrome right upper extremity secondary  to recently created arteriovenous fistula.   OPERATION:  Ligation right upper arm brachial artery to cephalic vein  arteriovenous fistula.   SURGEON:  Quita Skye. Hart Rochester, M.D.   ANESTHESIA:  Local Xylocaine.   COMPLICATIONS:  None.   PROCEDURE:  The patient taken the operating room, placed in supine  position at which time the right upper extremity was prepped Betadine  scrub solution, draped in a routine sterile manner.  After infiltration  of 1% Xylocaine, the transverse incision in the antecubital area was  reopened.  Brachial artery to cephalic vein anastomosis was exposed.  There was a strong pulse and palpable thrill in the fistula and the  cephalic vein was mobilized where it was anastomosed to brachial artery.  It was ligated with 2-0 silk tie being careful not to decrease any flow  or impinge on the brachial artery.  This immediately increased the flow  distally and there was a palpable radial pulse and improved Doppler flow  at the wrist.  The vein was also ligated distally and transected.  Adequate hemostasis was achieved.  Wound was irrigated with saline,  closed in layers with Vicryl in subcuticular fashion.  Sterile dressing  applied.  The patient taken to the recovery room in satisfactory  condition.      Quita Skye Hart Rochester, M.D.  Electronically Signed     JDL/MEDQ  D:  03/08/2007  T:  03/09/2007  Job:  [id number]

## 2010-12-25 NOTE — Discharge Summary (Signed)
NAMEIVY, PURYEAR NO.:  [id number]   MEDICAL RECORD NO.:  [id number]          PATIENT TYPE:  INP   LOCATION:  2003                         FACILITY:  [facility]   PHYSICIAN:  Balinda Quails, M.D.    DATE OF BIRTH:  07/25/1976   DATE OF ADMISSION:  10/06/2007  DATE OF DISCHARGE:  10/10/2007                               DISCHARGE SUMMARY   ADMITTING DIAGNOSIS:  Claudication of left brachial artery.   DISCHARGE DIAGNOSIS:  Claudication of left brachial artery.   SECONDARY DISCHARGE DIAGNOSES:  1. Chronic renal insufficiency.  2. Hypertension.  3. Hyperlipidemia.  4. Smoker 1 pack per day.   HOSPITAL CONSULTATIONS:  1. Physical therapy on October 07, 2007.  2. Renal on October 08, 2007.   PROCEDURES:  Repair of a left brachial artery with reverse saphenous  vein interposition graft done on October 06, 2007, by Dr. Balinda Quails.   BRIEF HISTORY AND PHYSICAL:  [name] is a 35 year old female  with chronic renal insufficiency approaching hemodialysis.  She had  multiple previous hemodialysis access procedures.  She had a right arm  arteriovenous fistula created which caused steal and required ligation.  She had a left upper arm arteriovenous graft placed.  This was performed  December 26 of last year.  During a recent nephrology visit she was  noted to have an occluded graft.  She was seen in the office last week  and the graft was found to be occluded in the left upper arm.  On  further questioning the patient describes severe claudication symptoms  in her left arm.  She describes claudication and early fatigue.  Workup  for this including duplex of her left brachial artery revealed this to  be occluded.  She had no palpable left radial pulse.  Arteriography was  not performed preoperatively due to the patient's chronic renal  insufficiency.  She is brought to the operating room at this time for  exploration of the left brachial artery and  repair.   HOSPITAL COURSE:  The patient after signing consent was taken to the OR  for exploration of left brachial artery and repair and the patient  agreed and signed to the consent done by Dr. Balinda Quails.  The patient  tolerated the procedure with no complications and remained stable  throughout and was transferred to the PACU where she continued to remain  stable.  The patient was then later transferred to the floor 2000 where  she continued to remain stable throughout the night.  The patient was  complaining of increased pain on postop day #1 in the left arm and  difficulty moving it.  She also stated that her left leg was sore from  where the vein was taken.  The patient's vital signs were stable and the  patient was afebrile.  The patient's labs were all within normal ranges  excluding her creatinine which was at 5.15 and her potassium was at 5.6.  On physical examination the patient was alert and oriented and  complaining of pain.  The left arm had dressings intact, clean and dry  with no edema and the dressings were planned to be changed on October 08, 2007.  The patient did have a 1+ left radial pulse, sensation and  motion intact.  Left leg, removed dressing and incision was clean and  dry without edema, the foot was warm and pink and well perfused.  Assessment and plan at this time was increase ambulation.  Discontinue  the PCA Dilaudid which was given to her earlier that night due to pain.  Also consult physical therapy to aid in left arm function.  A K pad was  also ordered for left arm use as needed for pain.  Postop day #2 the  patient continued to complain of some pain.  Incisions looked good.  The  patient did have a 2+ left radial pulse today.  Vital signs were stable  and the patient was afebrile.  We will recheck her BMET in the a.m. and  notify renal of admission.  On postop day #3 the patient continued to  remain stable with no changes.  Said the pain has  slightly decreased and  feels that her little finger had been frozen the night before,  apparently there was some discoloration and pain.  The patient was still  receiving Dilaudid through PCA pump at this time.  Upon entering room  the patient was oriented and alert x3 but was sleeping in bed and easily  aroused.  Her left arm incisions were healing and no signs of infection  with some mild edema and some mild bruising.  The left little finger  still was supple with pain on palpation, moderate from previous day but  finger had become a lighter purple.  The patient did have a 2+ radial  pulse in the left arm.  Left leg incision looked good with some mild  erythema.  Assessment and plan is to continue to increase the mobility  of the left arm.  Discontinue PCA pump to oral medications today.  Would  continue to follow the left finger and no intervention needed at this  time.  Plan to discharge to home on Saturday or Sunday.  On postop day  #4 vital signs 104/69, heart rate 80, respirations 18, temperature 97.2,  O2 98 on room air.  There were no current labs.  The patient states she  is ready to go home.  She does complain of pain in the left little  finger.  Upon entering the room the patient is alert and oriented x3,  moving in room, patient had showered the previous day.  The left arm  incisions were healing and clean and dry.  There was some mild edema and  ecchymosis but there was also 2+ radial pulse.  The little left finger:  There were not any changes but increased pain on palpation. It is  questionable whether it is an emboli.  The left leg incision looked good  with mild erythema at incision site edges.  Also noted on the left  finger possible emboli but we think this might have been due to  something previous, not surgery related.  Assessment and plan at this  time was to go ahead and change the patient's oral medications to  Dilaudid to control pain at home.  Also agree with the  patient that she  is ready to go home and will discharge today.  The patient will also  follow up with Dr. Madilyn Fireman for her left little finger on Thursday.  The  patient agrees to this  plan.   DISCHARGE MEDICATIONS:  1. Lipitor 20 mg daily.  2. Enalapril 20 mg daily.  3. Ambien as needed.  4. __________  daily.  5. Sodium bicarb three times a day.  6. Xanax as needed 0.5 mg.  7. Percocet 5/325 daily as needed.  8. Reglan with meals.  9. Lasix 20 mg daily.   DISCHARGE INSTRUCTIONS:  The patient is indicated for discharge on  October 10, 2007, after discussion with patient who is in agreement to  go home.  The patient agrees to these instructions and states that she  understands this.  The patient is to follow a renal diet.  The patient  is to increase activity as tolerated with assistance.  The patient may  shower and bathe.  No lifting or driving for 2 weeks.  The patient is to  clean gently with soap and water.  The patient is to contact the office  if increased redness, drainage, swelling, fever greater than 101, arm  pain or significant changes in left finger.  The patient is to follow up  with Dr. Madilyn Fireman on October 15, 2007, Thursday for followup of the finger and  arm.  The patient also has an appointment to return to the office for  staple removal in 2 weeks.  The patient is to continue home medications.  The patient is also given Dilaudid 2 mg one to two tablets every 4-6  hours as needed for pain.  The patient was also instructed to please not  take this within 4 hours of taking Percocet.  The patient stated that  she understood these instructions and would return for followup with Dr.  Madilyn Fireman on Thursday.  The patient was discharged to home as long as she  continues to remain stable on October 10, 2007.  She initially  was supposed to be discharged to home on October 07, 2007, but her stay  got extended due to increased pain in the left arm and left little  finger.  The  patient states she is ready to go home and agrees with this  plan.  The patient was discharged home today.  She is currently stable.      Cyndy Freeze, PA      P. Liliane Bade, M.D.  Electronically Signed    ALW/MEDQ  D:  10/10/2007  T:  10/11/2007  Job:  [id number]

## 2010-12-25 NOTE — Op Note (Signed)
NAMELADIAMOND, GALLINA           ACCOUNT NO.:  [id number]   MEDICAL RECORD NO.:  [id number]          PATIENT TYPE:  AMB   LOCATION:  SDS                          FACILITY:  [facility]   PHYSICIAN:  Quita Skye. Hart Rochester, M.D.  DATE OF BIRTH:  05/07/1976   DATE OF PROCEDURE:  10/27/2008  DATE OF DISCHARGE:  10/27/2008                               OPERATIVE REPORT   PREOPERATIVE DIAGNOSIS:  End-stage renal disease.   PREOPERATIVE DIAGNOSIS:  End-stage renal disease.   OPERATIONS:  1. Bilateral ultrasound localization, internal jugular veins.  2. Insertion of Diatek catheter via right internal jugular vein (24      cm).   SURGEON:  Quita Skye. Hart Rochester, MD   FIRST ASSISTANT:  Nurse.   ANESTHESIA:  Local.   PROCEDURE:  The patient was taken to the operating room, placed in the  supine position, at which time, upper chest and neck were exposed.  Both  internal jugular veins were imaged using B-mode ultrasound.  Both noted  to be patent with normal-appearing flow.  After prepping and draping in  routine sterile manner, right internal jugular vein was entered using a  supraclavicular approach.  Guidewire passed into the right atrium under  fluoroscopic guidance.  After attempting to pass a dilator over the  guidewire unsuccessfully, guidewire was exchanged for an Amplatz super-  stiff wire through a diagnostic catheter and a peel-away sheath was then  passed over the Amplatz wire without difficulty into the right atrium.  A 24-cm Diatek catheter was positioned in the right atrium, tunneled  peripherally, secured with nylon sutures, and the wound was closed with  Vicryl in subcuticular fashion.  Sterile dressing applied.  The patient  was taken to recovery room in satisfactory condition.      Quita Skye Hart Rochester, M.D.  Electronically Signed     JDL/MEDQ  D:  10/27/2008  T:  10/28/2008  Job:  [id number]

## 2010-12-25 NOTE — Op Note (Signed)
[name], [name] NO.:  [id number]   MEDICAL RECORD NO.:  [id number]           PATIENT TYPE:   LOCATION:                                 FACILITY:   PHYSICIAN:  Quita Skye. Hart Rochester, M.D.       DATE OF BIRTH:   DATE OF PROCEDURE:  08/07/2007  DATE OF DISCHARGE:                               OPERATIVE REPORT   PREOP DIAGNOSIS:  End-stage renal disease.   POSTOP DIAGNOSIS:  End-stage renal disease.   OPERATION:  1. Exploration of left antecubital area for adequate vein--no adequate      veins found.  2. Insertion of a left upper arm brachial artery to axillary vein AV      Gore-Tex graft (4 mm - 7 mm stretch).   SURGEON:  Quita Skye. Hart Rochester, M.D.   FIRST ASSISTANT:  Nurse.   ANESTHESIA:  Local.   PROCEDURE:  The patient was taken to the operating room, and placed in  the supine position, at which time the left upper extremity was prepped  with Betadine scrub and solution and draped in a routine sterile manner.  After infiltration with 1% Xylocaine with epinephrine, transverse  incision was made through the previous scar, in the antecubital area  where an upper arm fistula had failed.   Cephalic vein was dissected free where it was anastomosed to the  brachial artery.  It was transected.  It was patent, but a Fogarty would  only traverse at about 2-3 cm, and was not adequate for a graft.  Exploration of the basilic and brachial veins revealed no adequate veins  for a forearm graft; therefore, the wound was closed with Vicryl in a  subcuticular fashion.   A short incision was made in the distal upper arm, brachial artery  exposed.  It was a 3 mm vessel, free of disease, but small.  A second  incision was made just distal to the axilla.  The vein was dissected  free adjacent to the high brachial artery, and it was a 4.5 mm vein, but  was adequate for a graft.   A curvilinear tunnel was created on the anterior aspect arm.  No heparin  was given.  The artery  was occluded proximally and distally with vessel  loops, easily dilated up to 3 mm. but no more.  Fogarty would pass  distally down to the wrist.  No thrombus retrieved.  The 4 x 7 mm  stretch Gore-Tex graft, which had been tunneled, was shortened only  slightly and anastomosed end-to-side to the artery with 6-0 Prolene.  Clamps were then released and the 7 mm end was spatulated.  The high  brachial vein was ligated distally, transected, and slightly spatulated,  and an end-to-end anastomosis was done with 6-0 Prolene, clamps were  released; and there was an excellent pulse and thrill in the graft.  There was slight diminution of distal flow with the graft opened, but  the ulnar flow was still intact with the graft open.   The patient is felt to be at high risk for a steal because of a previous  steal  in the contralateral arm.  Adequate hemostasis was achieved, and  the wound was closed in layers with Vicryl in a subcuticular fashion.  Sterile dressing applied.  The patient taken to recovery room in  satisfactory condition.      Quita Skye Hart Rochester, M.D.  Electronically Signed     JDL/MEDQ  D:  08/07/2007  T:  08/07/2007  Job:  [id number]

## 2010-12-25 NOTE — Procedures (Signed)
VASCULAR LAB EXAM   INDICATION:  ESRD, status post left upper arm axillary vein to brachial  artery AVGG done in December of 2008 by Dr. Hart Rochester.  Currently there is  no thrill in the graft and the patient complains of left arm  claudication.   HISTORY:  Diabetes:  No.  Cardiac:  No.  Hypertension:  Yes.  The patient smokes 1 pack per day.   EXAM:  Left upper extremity arterial duplex.   IMPRESSION:  1. Left upper arm brachial artery to axillary vein AVGG is occluded      via color duplex and Doppler interrogation.  2. The left brachial artery appears to be occluded at the level of the      mid arm through the distal portion at the antecubital fossa.  3. The left radial and ulnar arteries are patent and are reconstituted      proximally by collaterals.  The velocities are diminished      throughout the forearm ranging from 11 to 34 cm per second.   ___________________________________________  P. Liliane Bade, M.D.   AR/MEDQ  D:  10/01/2007  T:  10/02/2007  Job:  [id number]

## 2010-12-25 NOTE — Assessment & Plan Note (Signed)
OFFICE VISIT   [name], [name]  DOB:  1976-05-29                                       07/30/2007  UEAVW#:[id number]   [name] has had 2 attempts of placement of AV fistula.  She had a right  arm brachiocephalic AV fistula created in July, this had to be ligated  secondary to steal.  She subsequently had a left brachiocephalic AV  fistula created on 06/26/2007, the vein was small, very borderline, and  it has subsequently thrombosed.   Her incisions are well healed.  She has 2+ left radial pulse.   I think the best option at this time would be to go ahead and place a  left upper arm arteriovenous graft.  She is at risk of steal potentially  with this, and also potential that her veins may be too small and this  graft could fail also.  I discussed the potential that she could need a  leg graft eventually.   Apparently, Dr. Eliott Nine would like this placed as soon as possible.  I  offered her to do this on the 24th or the 26th of December, she  declined.  This is scheduled for the 29th of December by Dr. Darrick Penna.   Balinda Quails, M.D.  Electronically Signed   PGH/MEDQ  D:  07/30/2007  T:  07/31/2007  Job:  [id number]

## 2010-12-25 NOTE — Assessment & Plan Note (Signed)
OFFICE VISIT   AQUARIUS, TREMPER  DOB:  1975/10/13                                       10/23/2007  [id number]#:[id number]   The patient presents today for continued followup of her left hand.  She  underwent repair of her left brachial artery after she had occlusion of  this after attempt at AV access.  She had harvest from her saphenous  vein from her left leg, and had replacement of her left brachial artery.  She has had multiple issues regarding pain.  She has called the office  on multiple occasions, and her mother is here with her today and is  quite agitated and angry regarding her care as well.  She did have an  appointment to see Dr. Madilyn Fireman that was planned yesterday, and did not  make the appointment, and told her mother that it was cancelled, which  it clearly was not.  She also told me that she has not had any pain  medicine prescriptions from Dr. Rubin Payor, and we have decided that Dr.  Rubin Payor would manage her pain medications.  She saw Dr. Darrick Penna on  03/04 and was given a prescription for Percocet, and was instructed Dr.  Rubin Payor would manage this.  I have tried to call Dr. Rubin Payor while  the patient was here, and he was not available.  I spoke with him later  in the afternoon and he informed that he did call in the prescription on  the ninth, earlier this week, for Percocet.  We will notify the patient  of this, and that she needs to continue to get her pain meds from Dr.  Rubin Payor.  She did have a totally normal radial pulse and well-perfused  hand.  I did remove her staples, and her wounds appear to be healing  quite nicely.  She is to see Dr. Madilyn Fireman in 2 weeks for continued  followup.   Larina Earthly, M.D.  Electronically Signed   TFE/MEDQ  D:  10/23/2007  T:  10/26/2007  Job:  [id number]

## 2010-12-25 NOTE — Discharge Summary (Signed)
[name] NO.:  [id number]   MEDICAL RECORD NO.:  [id number]          PATIENT TYPE:  INP   LOCATION:  5506                         FACILITY:  [facility]   PHYSICIAN:  Altha Harm, MDDATE OF BIRTH:  08/29/1975   DATE OF ADMISSION:  11/28/2008  DATE OF DISCHARGE:  12/02/2008                               DISCHARGE SUMMARY   DISCHARGE DISPOSITION:  Home.   FINAL DISCHARGE DIAGNOSES:  1. Flash pulmonary edema.  2. Accelerated hypertension.  3. End-stage renal disease.  4. Hyperlipidemia.  5. Anemia of chronic disease.   DISCHARGE MEDICATIONS:  1. Zocor 20 mg p.o. daily.  2. Enalapril 20 mg p.o. b.i.d.  3. Nephrovite 1 tablet p.o. daily.  4. Renagel 1600 mg p.o. t.i.d.  5. Venofer 100 mcg IV weekly.  6. Epogen 10,000 units weekly with hemodialysis.  7. Hydralazine 25 mg p.o. t.i.d.  8. Norvasc 10 mg p.o. daily.  9. Coreg 12.5 mg p.o. b.i.d.  10.Percocet 2 tablets p.o. q.6 h. p.r.n. pain.   CONSULTANTS:  1. Garnetta Buddy, MD., Hemodialysis.  2. Luis Abed, MD and Jonelle Sidle, MD, Cardiology.  3. Balinda Quails, MD., Vein/Vascular Surgery.   PROCEDURES:  1. Removal of right internal jugular Diatek catheter and insertion of      a right femoral Diatek catheter.  2. Transesophageal echocardiogram.  3. Hemodialysis on Tuesday, Thursday and Saturday.   DIAGNOSTIC STUDIES:  1. Portable chest x-ray done on admission which shows heart size upper      normal.  No congestive heart failure or pneumonia in one view.  2. CT angiogram done on the same day which shows no evidence of      pulmonary emboli.  Severely stenotic or occluded SVC with      indwelling dialysis catheter.  Impression:  Cardiomegaly with mild vascular congestion.  Bilateral pleural  effusions, right greater than left with bibasilar atelectasis.  Superimposed infiltrates not included.  1. Portable abdominal x-ray done on November 30, 2008 which shows right      femoral  catheter in place.  2. Portable chest x-ray done on November 30, 2008 which shows dialysis      catheter, no acute finding.  3. Acute abdominal x-ray done on December 02, 2008 which shows right      femoral dialysis catheter in stable position, moderate stool burden      throughout the colon.  No acute findings.  4. A 2-D echocardiogram done on November 30, 2008 which shows left      ventricular wall thickness normal, function preserved with ejection      fraction of 50-55%.  No wall motion abnormality with findings of a      left apical mass.  5. Transesophageal echocardiogram done on December 02, 2008 which shows      LV ejection fraction of 55-60%, small PFO, nodular area at the base      of the right atrium which does not represent either vegetation or      myxoma of uncertain significance.  Further investigation if desired      by primary care  physician can be carried out with cardiac MRI.   CODE STATUS:  Full code.   ALLERGIES:  1. VANCOMYCIN.  2. REGLAN.   CHIEF COMPLAINT:  Shortness of breath and orthopnea for 2 days.   HISTORY OF PRESENT ILLNESS:  Please refer to the H and P dictated on  November 28, 2008 by Dr. Misty Stanley for details of the HPI.   HOSPITAL COURSE:  1. This patient clearly presented in flash pulmonary edema probably      owing to her accelerated hypertension in addition to her end-stage      renal disease.  The patient was treated for accelerated      hypertension and is now on the above stated medications.  Her blood      pressures improved significantly and are now within a normotensive      range.  The patient was also started on dialysis.  The combination      of lowering blood pressure and dialysis resolved the patient's      symptoms.  A 2-D echocardiogram showed preserved left ventricular      size and function, and other findings as noted above.  On the      transthoracic echocardiogram, there are findings of a left apical      mass and thus transesophageal  echocardiogram was done by Dr.      Diona Browner who finds that the left apical mass is neither vegetation      or myxoma and is of uncertain significance.  He has recommended      that no further investigation is necessary at this point.  However      if the primary care physician wants to evaluate it further, it can      certainly be done with a cardiac MRI.  I have discussed this with      the patient who does not wish to pursue that at this time.      Furthermore, it can be done as an outpatient as the patient runs no      immediate risk at this time.  2. Removal of IJ Diatek cath and insertion of right femoral Diatek      cath.  This was done by Dr. Madilyn Fireman in the OR.  The patient then      underwent dialysis with the [facility] cath and has had no problems      with it.  3. Right lower quadrant abdominal pain.  The patient has no fever, no      white count, no vaginal discharge.  In the interest of      investigating any complications of the placement of the Diatek      cath, an acute abdominal series was done which was completely      normal.  This patient is mid menstrual cycle and the description of      her pain fits that of mittelschmerz pain.  The patient is being      treated with Percocet for her pain.  I expect that as the patient      progresses along in her cycle, the pain will subside.  4. Migraine headaches.  The patient presented with an atypical      migraine which was treated with Imitrex and resolved completely.      Otherwise, the patient was stable and  continued on her usual      medications.   DIETARY RESTRICTIONS:  The patient should be on a 2 gm sodium  renal  diet.   PHYSICAL LIMITATIONS:  None.   FOLLOW UP:  The patient is to follow up with Dr. Hyman Hopes and also with her  dialysis where her blood pressure will be monitored.   Total time for this discharge 40 minutes.      Altha Harm, MD  Electronically Signed     MAM/MEDQ  D:  12/02/2008  T:   12/02/2008  Job:  [id number]   cc:   Garnetta Buddy, M.D.  Jonelle Sidle, MD  P. Liliane Bade, M.D.  Luis Abed, MD, [facility]

## 2010-12-25 NOTE — Assessment & Plan Note (Signed)
OFFICE VISIT   [name], [name]  DOB:  01-Apr-1976                                       04/28/2008  GNFAO#:[id number]   The patient presents today for planning of hemodialysis access.  She has  had a number of failed access procedures including right brachial  cephalic arteriovenous fistula which was ligated due to steal.  She had  a left brachial cephalic arteriovenous fistula which failed.  Placement  of a left upper arm AV graft which was complicated by left brachial  artery occlusion requiring reconstruction of the left brachial artery.   Now has a dialysis catheter in place.   BP 176/112, pulse is 64 per minute.   Lower extremity evaluation reveals 2+ femoral, popliteal, posterior  tibial, dorsalis pedis pulses.   Best option would seem at this time to be a left leg AV graft.  This is  scheduled for 06/07/2008 at [facility] with 23 hour  observation.   Balinda Quails, M.D.  Electronically Signed   PGH/MEDQ  D:  04/28/2008  T:  04/30/2008  Job:  [id number]

## 2010-12-25 NOTE — Assessment & Plan Note (Signed)
OFFICE VISIT   LACHLAN, PELTO D.  DOB:  Oct 28, 1975                                       10/01/2007  UUVOZ#:[id number]   Patient was seen as an add-on today due to occlusion of her recently  placed left upper arm AV graft.  This was placed by Dr. Hart Rochester on  08/07/07.  She was apparently seen recently at the [facility]  office and noted to have an occluded graft.   PHYSICAL EXAMINATION:  Blood pressure is 120/88.  The left upper arm  graft is occluded.  No palpable left radial pulses present.   On further questioning, patient complains of some claudication-type  symptoms with fatigue in her left arm.   The left brachial artery was scanned today, and the graft is occluded.  The left brachial artery is occluded from the mid upper arm to the  antecubital fossa.  The left radial and ulnar arteries reconstituted via  collaterals with flow into the hand.   With these findings, I do think patient requires re-exploration of the  left brachial artery with planned thrombectomy and attempt at further  thrombectomy of the left arm graft at that time.   Balinda Quails, M.D.  Electronically Signed   PGH/MEDQ  D:  10/01/2007  T:  10/02/2007  Job:  [id number]

## 2010-12-25 NOTE — H&P (Signed)
[name], [name] NO.:  [id number]   MEDICAL RECORD NO.:  [id number]          PATIENT TYPE:  INP   LOCATION:  5506                         FACILITY:  [facility]   PHYSICIAN:  Selena Batten, MD     DATE OF BIRTH:  12/08/1975   DATE OF ADMISSION:  11/28/2008  DATE OF DISCHARGE:                              HISTORY & PHYSICAL   PRIMARY CARE PHYSICIAN:  [facility].   HPI:  The patient is going to be followed by [employer]  [facility].  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  The patient also endorses headache, unilateral and  localized to the right side; pain started at the base of her neck and  radiated to the temporal and frontal area.  The pain has been present  for the last three weeks prior to admission.  Per patient, HA completely  correlates with Diatek cath placement, procedure performed on March 18  on the right side.  The patient's blood pressure has been in the 220s  range for the last 10 days, and she reports to being pretty compliant  with her medications and hemodialysis, since last admission in February,  in order to qualify to be in the kidney transplant list.  The patient  has history of previous non-medication compliance.   REVIEW OF SYSTEMS:  All systems have been negative except as marked and  noted in the HPI.  CONSTITUTIONAL:  Negative for fever, night sweats and  weight loss.  CARDIOVASCULAR:  Negative for chest pain, diaphoresis and  palpitations.  RESPIRATORY:  Negative for cough or wheezing.  GASTROINTESTINAL:  Negative for diarrhea, hematemesis, melena, vomiting,  hematochezia and abdominal pain.  NEUROLOGIC:  Negative for altered  mental status  or syncope.   PAST MEDICAL HISTORY:  As mentioned on her HPI.   MEDICATIONS:  1. Zocor 20 mg by mouth daily.  2. Enalapril 10 mg by mouth three times per day.  3. Nephro-Vite one tablet by mouth daily.  4. Renagel 800 mg two tablets by mouth t.i.d.  5. Epogen 10,000 units with hemodialysis IV.  6. Venofer 100 mg IV weekly.   SOCIAL HISTORY:  The patient denies alcohol, denies illicit drugs.  The  patient admitted to smoking currently within the past 12 months.   FAMILY HISTORY:  Hypertension, hyperlipidemia.   ALLERGIES:  VANCOMYCIN, REGLAN   PHYSICAL EXAMINATION:  VITALS:  Blood pressure 216/120, heart rate 76,  respiratory rate 22, oxygen saturation 100% on room air and temperature  98.4.  GENERAL:  the patient was sitting on bed, no acute distress, able to  talk in full sentences.  LUNGS:  With bibasilar crackles.  Good air movement.  No rhonchi.  No  wheezing.  HEART:  Regular rate and rhythm.  No rubs or gallops.  ABDOMEN:  No tenderness, no distention.  Positive bowel sounds.  EXTREMITY:  Without  edema, cyanosis or clubbing.  Good pulses  bilaterally bruits on the right foot.  No history of injury on the foot.  NEUROLOGIC:  The patient was alert, awake and oriented x3.  Muscle  strength 5/5, cranial nerves II-XII intact.  Normal finger-to-nose.   LABORATORY DATA AND IMAGING:  EKG with nonspecific T-wave change and  with atrial hypertrophy.  The patient had cardiac markers with troponin  less than 0.05.  Myoglobin 202, CK-MB less than one.  Have an I-Stat  demonstrating sodium 137, potassium 5.5, chloride 104, bicarb 22, BUN  50, creatinine 10.2 and blood sugar 96.  We have platelets 122, white  blood cells 5.6, hemoglobin 12.7, BNP 3186, D-dimer 1.10.  The patient  had a chest x-ray that demonstrated no infiltrates and just mild  cardiomegaly.  CT angio without pulmonary embolism findings, bilateral  pleural effusion, cardiomegaly and mild vascular congestion with  also  chronic AVC and stenosis.  The patient's Diatek catheter was well  positioned and without changes in comparison to previous x-ray.   IMPRESSION AND PLAN:  1. Shortness of breath with bilateral pleural effusion, cardiomegaly      and extremely elevated BMP and orthopnea, likely secondary to CHF.      The patient will receive 2-D echo.  We are going to control her      blood pressure.  We are going to given oxygen, morphine and will      contact Renal for hemodialysis.  The patient is due for dialysis      tomorrow any way.  We are going to rule out acute coronary syndrome      and also infections as other causes that could be precipitating her      CHF.  2. End-stage renal disease.  The patient will continue hemodialysis.      Renal service is going to be contacting in order to arrange the      dialysis.  The patient will also receive their recommendations for      treatment of hyperkalemia and hyperparathyroidism.  3. Hypertension, really poor controlled.  We are going to continue      enalapril.  We are going to change the dose to 20 mg by mouth      b.i.d., and we are going to add Coreg 12.5 mg by mouth twice a day.      We are going to check a TSH to rule out other causes for her      hypertension.  We are going to adjust medications after      hemodialysis as needed.  If blood pressure continues to be poor      controlled, the patient will benefit as well of hydralazine.  Even      this one in a patient who has a history of noncompliance, could be      a little bit challenging.  4. Hyperlipidemia.  We are going to check a fasting lipid profile and      we are going to continue Zocor 20 mg by mouth daily.  5. Hyperkalemia.  We are going to check a B-met.  The patient already      received in the emergency department, insulin and also calcium      gluconate.  There were no changes on her EKG.  She will have      hemodialysis in the morning and if potassium continues to be       elevated, we are going to give Kayexalate.  6. Hyperparathyroidism.  The patient is supposed to be taking Renagel      800 mg 2 tablets t.i.d..  By this point, she is not really      following that regimen because it is upsetting her stomach.  We are      going to let the Renal Service to adjust Renagel and PhosLo, and we      are going to check a phosphorus level today.  7. Tobacco abuse.  We are going to start on nicotine patch and we are      going to ask clinical social worker for cessation counseling.  8. Thrombocytopenia.  We are going to, at this point, most likely      secondary to the use of heparin throughout her hemodialysis.  We      are going to check platelets level tomorrow in the morning.  We are      going to check a PT/PTT, INR today and as prior prophylaxis, we are      going to use just SCDs for now.  If needed, the patient      will receive benefit of checking platelets function panel..  9. Prophylaxis.  We are going to use SCDs, early ambulation and      Protonix.   Of note, the patient assesment and plan has been discussed in details  with Dr. Misty Stanley.      Rosanna Randy, MD  Electronically Signed      Selena Batten, MD  Electronically Signed    CEM/MEDQ  D:  11/28/2008  T:  11/29/2008  Job:  [phone]

## 2010-12-25 NOTE — Assessment & Plan Note (Signed)
OFFICE VISIT   [name], [name]  DOB:  1975-09-02                                       05/12/2008  KGURK#:[id number]   The patient presented today with complaints of fatigue in her left  forearm.  She has previously undergone a left brachial artery repair  with reverse saphenous vein interposition graft.  This was carried out  in February of 2009.   Her blood pressure is 158/105 in the right arm with a pulse of 80 per  minute.   Duplex of her left arm does reveal patent brachial interposition  saphenous vein graft.   There is one area of mild increase in velocity of 151 cm/sec.   Pressures in the left arm is reduced at 120 compared to right of 148.   With these symptoms of relatively significant claudication of the left  arm scheduled to undergo left upper extremity arteriogram with possible  PTA on 05/31/2008 at [facility].  To begin Plavix 75 mg daily  prior to the procedure.   Balinda Quails, M.D.  Electronically Signed   PGH/MEDQ  D:  05/12/2008  T:  05/14/2008  Job:  [id number]

## 2010-12-25 NOTE — Op Note (Signed)
[name], ARMETTA           ACCOUNT NO.:  [id number]   MEDICAL RECORD NO.:  [id number]          PATIENT TYPE:  AMB   LOCATION:  SDS                          FACILITY:  [facility]   PHYSICIAN:  Juleen China IV, MDDATE OF BIRTH:  11-05-1975   DATE OF PROCEDURE:  03/02/2008  DATE OF DISCHARGE:  03/02/2008                               OPERATIVE REPORT   PREOPERATIVE DIAGNOSIS:  Chronic kidney disease.   POSTOPERATIVE DIAGNOSIS:  Chronic kidney disease.   PROCEDURE PERFORMED:  1. Ultrasound access, right internal jugular vein.  2. Diatek catheter placement (28 cm).   TYPE OF ANESTHESIA:  MAC.   COMPLICATIONS:  None.   BLOOD LOSS:  Minimal.   FINDINGS:  Catheter tip in right atrium.   PROCEDURE:  The patient was identified in the holding area and taken to  room #8 where she was placed supine on the table.  MAC anesthesia was  administered.  The patient's right neck and chest were prepped and  draped in the standard sterile fashion.  Time-out was called.  Antibiotics were given.  Ultrasound was used to evaluate the right  internal jugular vein, which was widely patent and easily compressible.  Lidocaine 1% was used for local anesthesia.  The right internal jugular  vein was accessed under ultrasound guidance with an 18-gauge needle.  A  0.035 J-wire was advanced into the right atrium under fluoroscopic  visualization.  Next, the tract was dilated with dilators from the kit  and ultimately the peel-away sheath was placed.  A 28-cm catheter was  placed through the sheath, which was then removed.  A site was selected  for the cuff exit site.  Lidocaine 1% was used in this area, and an 11  blade was used to make a skin incision.  A subcutaneous tunnel was  created.  This was dilated using the dilator from the kit.  The catheter  was brought through the tunnel and the cuff was placed in the skin exit  site.  Fluoroscopy was used to confirm that the catheter tip was in the  right  atrium.  It also was used to confirm there were no kinks within  the catheter.  Both ports were flushed and aspirated without difficulty.  Catheter was sutured into place with 3-0 nylon.  The skin incision of  the neck was closed with 4-0 Vicryl.  The proper volume of heparin was  placed.  The patient tolerated the procedure well.  There were no  complications.           ______________________________  V. Charlena Cross, MD  Electronically Signed     VWB/MEDQ  D:  03/02/2008  T:  03/03/2008  Job:  [id number]

## 2010-12-25 NOTE — Op Note (Signed)
[name], [name] NO.:  [id number]   MEDICAL RECORD NO.:  [id number]          PATIENT TYPE:  OUT   LOCATION:  MDC                          FACILITY:  [facility]   PHYSICIAN:  Janetta Hora. Fields, MD  DATE OF BIRTH:  1976/04/17   DATE OF PROCEDURE:  10/25/2008  DATE OF DISCHARGE:  10/25/2008                               OPERATIVE REPORT   PROCEDURE:  Removal of left internal jugular vein Diatek catheter.   PREOPERATIVE DIAGNOSIS:  Infected left internal jugular vein Diatek.   POSTOPERATIVE DIAGNOSIS:  Infected left internal jugular vein Diatek.   ANESTHESIA:  Local with IV sedation.   ASSISTANT:  Nurse.   OPERATIVE FINDINGS:  Watery discharge from left internal jugular vein  catheter.   SPECIMENS:  Tips sent for culture.   OPERATIVE DETAILS:  After obtaining informed consent, the patient was  taken to the operating.  The patient was placed in supine position on  the operating table.  After adequate sedation, the patient's entire left  neck and chest were prepped and draped in the usual sterile fashion.  Local anesthesia in the form of 0.25% Marcaine with epinephrine mixed  half-and-half with 1% lidocaine was injected subcutaneously around the  subcutaneous portion of the catheter in the left supraclavicular region  and around the level of the cuff.  A transverse incision was made on the  anterior chest wall over the level of cuff.  This was freed up with  blunt dissection.  There was some watery discharge around the catheter.  The cuff was completely freed up and the catheter transected.  The  distal end of the catheter was completely removed and the tip cut and  sent off as a specimen for culture.  The remainder of catheter was then  removed and hemostasis obtained with direct pressure at the base of the  neck.  Both wounds were left open, the exit site of the catheter as well  as the cutdown site due to infection in the wound.  The patient  tolerated the  procedure well.  There were no immediate complications.  Instrument, sponge, and needle counts were correct at the end of the  case.  Dry sterile dressing was applied to both wounds.  The patient was  taken to recovery room in stable condition.      Janetta Hora. Fields, MD  Electronically Signed     CEF/MEDQ  D:  10/25/2008  T:  10/26/2008  Job:  [id number]

## 2010-12-25 NOTE — H&P (Signed)
[name], [name] NO.:  [id number]   MEDICAL RECORD NO.:  [id number]          PATIENT TYPE:  INP   LOCATION:  6736                         FACILITY:  [facility]   PHYSICIAN:  Terrial Rhodes, M.D.DATE OF BIRTH:  [birth year]   DATE OF ADMISSION:  04/06/2008  DATE OF DISCHARGE:                              HISTORY & PHYSICAL   CHIEF COMPLAINT:  Neck pain, fever, and chills.   HISTORY OF PRESENT ILLNESS:  [name] is a 35 year old lady with  past medical history of end-stage renal disease secondary to chronic  glomerulonephritis, cause unknown; hypertension; hyperlipidemia, who was  recently started on hemodialysis for about a month and had an access of  right IJ Diatek catheter which was placed on March 02, 2008.  Was getting  her hemodialysis today, during which she felt neck pain on right side,  chills, and some sweatiness.  She did not have any history of fever at  home or in the Dialysis Center and then she came to the emergency where  she was found to have a temperature of 101.3.  She completed dialysis  for 2-1/2 hour.  There is no nausea, vomiting, diarrhea, chest pain,  shortness of breath, or rash.  We were asked to admit the patient for  further care.   PAST MEDICAL HISTORY:  1. End-stage renal disease secondary to chronic glomerulonephritis,      cause unknown.  2. Hypertension.  3. Hyperlipidemia.   SURGICAL HISTORY:  1. Multiple attempts to get grafts in her legs and arms, but none of      them worked.  2. Status post appendectomy and cholecystectomy.  3. Status post D and C.   ALLERGIES:  No known drug allergies.   HOME MEDICATIONS:  1. Ambien 10 mg p.o. p.r.n. at bedtime.  2. Xanax 0.5 mg p.o. p.r.n.  3. Simvastatin 20 mg p.o. daily.  4. Sodium bicarbonate 650 mg p.o. t.i.d.  5. Zemplar 1 mcg p.o. daily.  6. Clonidine 0.1 mg p.o. t.i.d.   Medications given in the ER, Tylenol 650 mg; tramadol 100 mg; Fortaz;  and vancomycin 2 g  IV.   SOCIAL HISTORY:  [name] lives in [place] with her mom.  She is  not employed presently.  She is divorced and she has a 33 year old boy  and 13-year-old twins.  Her social history is positive for 10-years pack  per day of smoking.  No regular alcohol abuse and patient denies any  illicit drug abuse.   FAMILY HISTORY:  Her mom is healthy at 34 and her father is also  healthy.  There is no heart or kidney disease in the siblings.   REVIEW OF SYSTEMS:  Positive for fever or chills, perspiration.  Negative for rash, lesions, chest pain, shortness of breath, cough, any  urinary frequency, or dysuria.   PHYSICAL EXAMINATION:  VITAL SIGNS:  Temperature 101.3, pulse 87,  respiration 18, blood pressure 122/100, oxygen saturation 100% on room  air.  GENERAL:  She is in minimal distress from pain.  HEAD AND NECK:  Extraocular muscles, movement are intact.  Sclerae  clear.  Neck is  supple.  There is a red, tender, and mildly swollen skin  lesion around the right IJ Diatek catheter site without any discharge.  There is also right cervical lymph node which is enlarged, tender, and  mobile.  CARDIOVASCULAR:  First and second heart sounds are normal.  Regular rate  and rhythm.  No rubs, murmur, or gallops.  LUNGS:  Clear to auscultation bilaterally without crackles or wheeze.  ABDOMEN:  Bowel sounds normal.  Soft and nontender.  No organomegaly.  EXTREMITIES:  No pitting pedal edema and no rash.  Access for right IJ  Diatek catheter.  NEUROLOGIC:  Alert and oriented x3.   LABORATORY DATA:  Hemoglobin 12.9, WBC 7.1 with absolute neutrophil  count of 6.3, MCV 90.7, platelets 104.  Sodium 133, potassium 3.9,  chloride 107, BUN 14, creatinine 3.7, glucose 92, ionized calcium 0.77.   ASSESSMENT/PLAN:  [name] is a 35 year old lady with past medical  history of end-stage renal disease, who was recently started on  hemodialysis for a month, came with neck pain and fever history.  1.  Fever.  This probably is a catheter site infection.  Blood cultures      have already been sent.  We will start her on broad-spectrum      antibiotics, namely vancomycin and Fortaz.  We will take the stitch      off the catheter, but we will continue catheter for now.  We will      observe for 24 hours and if there is any fever, we will take the      catheter out.  If so, she needs new access to continue her      hemodialysis.  2. Pain.  We will continue with Percocet for now.  There is no other      obvious source of pain except the catheter site associated with      infection.  We also noted the painstaking behavior of the patient.      We will keep an eye on this.  3. Hypertension.  The patient currently is taking only clonidine in      home.  We will continue the same to avoid any rebound hypertension.      Hemodialysis will also help with her blood pressure.  4. End-stage renal disease.  If the patient stays in the hospital      until her next hemodialysis which is 2 days after the admission day      on Friday, we will have to resume the hemodialysis.  Depending on      whether this is spiked fever or not, we will have to get a new IV      access for dialysis.  It is noted that the patient and her mom is      working on renal transplant in [place] next month.  The mother      has been masked for the renal transplant.  5. Anemia.  Hemoglobin is stable at 12.9.  6. Decreased platelets.  Platelets today is 104.  It was noted from      the EMR that she had thrombocytopenia in the      past.  I will watch closely.  7. Hyperlipidemia.  We will continue on Zocor.   DISPOSITION:  If the patient is afebrile for 24 hours, we will discharge  home on antibiotics.      Jason Coop, MD  Electronically Signed     ______________________________  Terrial Rhodes, M.D.  YP/MEDQ  D:  04/06/2008  T:  04/07/2008  Job:  [id number]   cc:   [facility] Sal[facility]. Eliott Nine, M.D.

## 2010-12-25 NOTE — Assessment & Plan Note (Signed)
OFFICE VISIT   [name], [name]  DOB:  [birth year]                                       10/14/2007  JYNWG#:[id number]   The patient recently had repair of her left brachial artery with an  interposition saphenous vein graft by Dr. Madilyn Fireman on February 24.  She  presents to the office today complaining of chronic pain in the tip of  her left fifth digit.  She apparently had some dusky color to this at  the time of her discharge from the hospital.  She was given pain  medication for this.  She presents today stating that her pain  medication does not work and for further evaluation.  She apparently was  given Dilaudid on discharge from the hospital.  She has chronic pain and  is in a pain medicine contract with Dr. Rubin Payor (phone number [phone]).   PHYSICAL EXAM:  Blood pressure is 132/87, pulse 65 and regular.  Left  upper extremity has healing incisions with staples still in place in the  left upper arm and antecubital area.  She has no palpable radial or  ulnar pulse, but has biphasic Doppler flow on exam.  She has biphasic  flow out into her digital arteries and palmar arch by Doppler as well.  She has duskiness of the tip of the left fifth digit, which is  approximately 3 cm by 3 cm in diameter.  There is no ulceration.  There  is no frank gangrene at this point.  The lesion is fairly painful to  palpation.  She has some numbness in the middle phalanx.   I believe the patient's bypass graft in the left brachial artery is  patent.  She has a well-perfused left hand.  She does have some  duskiness in the left fifth digit.  This may have been from microemboli.  I informed her today that she may have some slough of skin in this area,  but overall, it should heal.  Hopefully, the pain will resolve over  time.  She was given a prescription today for Percocet 7.5/325 #40  dispensed.  I called Dr. Rubin Payor today and he will be in charge of all  further  pain medication prescriptions for her, since he is already  writing her pain medicine prescriptions for back pain and her other  issues.  This will eliminate the multiple doctors she is currently  getting Percocet prescriptions from.  She will see Dr. Madilyn Fireman next week.   Janetta Hora. Fields, MD  Electronically Signed   CEF/MEDQ  D:  10/14/2007  T:  10/15/2007  Job:  [id number]

## 2010-12-25 NOTE — Discharge Summary (Signed)
[name], MACKINTOSH NO.:  [id number]   MEDICAL RECORD NO.:  [id number]          PATIENT TYPE:  INP   LOCATION:  6736                         FACILITY:  [facility]   PHYSICIAN:  Maree Krabbe, M.D.DATE OF BIRTH:  19-Mar-1976   DATE OF ADMISSION:  04/06/2008  DATE OF DISCHARGE:  04/09/2008                               DISCHARGE SUMMARY   DISCHARGE DIAGNOSES:  1. Dialysis catheter-related infection with methicillin-sensitive      Staphylococcus aureus bacteremia.  2. End-stage renal disease on hemodialysis.  3. Hypertension.  4. Bradycardia, resolved.  5. Liver dysfunction, new onset.  6. Anemia.  7. Hyperlipidemia.  8. Secondary hyperparathyroidism.   DISCHARGE MEDICATIONS:  1. Ancef 2 g IV after each dialysis for 3 weeks.  2. Enalapril 5 mg twice a day by mouth.  3. Hectorol 2 mcg with hemodialysis.  4. Erythropoietin 6200 units with hemodialysis.  5. Zocor 20 mg by mouth at bedtime daily.  6. Oxycodone 5 mg 1-2 tablets as required for pain, a total of 15      tablets supplied.  7. Please note clonidine and Tylenol had been discontinued.   DISPOSITION AND FOLLOW-UP:  The patient will continue with Monday,  Wednesday, Friday hemodialysis at the [facility].  The  patient will come back to hospital on April 11, 2008, Monday at 9  o'clock in the morning for PermCath placement with intervention  radiologist.  Please note the patient will need repeat liver function  test in about 1 week's time to check her liver function.  Also, it is  recommended that further workup is done as outpatient for her liver  dysfunction.  No change has been made to her dialysis regimen.   STUDIES/PROCEDURES:  1. Chest x-ray, April 02, 2008, dialysis catheter in place, no      pneumothorax evident, no acute process seen.  2. Ultrasound and fluoroscopic guided placement of non-tunneled      dialysis catheter on April 08, 2008.   CONSULTS:  Intervention  radiologist was consulted for removal of  dialysis catheter and placement of a temporary catheter followed by  placement of a new catheter.   ADMISSION HISTORY:  [name] is a 35 year old lady with past  medical history of end-stage renal disease, hypertension,  hyperlipidemia, status post right internal jugular Diatek done in March 02, 2008.  She had been getting her hemodialysis for the last 1 month.  During her dialysis on the day of admission, that is April 06, 2008,  she developed neck pain on the right side associated with chills and  sweating.  She denied any fever, but she was found to have a temperature  of 101.3 degrees Fahrenheit in the ED.  She was able to get only 2-1/2  hours of hemodialysis.  She denied any nausea, vomiting, diarrhea, chest  pain, or shortness of breath.  No rashes.  She came to ED from the  Dialysis Center and was admitted for further workup.   ADMISSION PHYSICAL:  VITAL SIGNS:  Temperature 101.3, pulse 87,  respiratory rate 18, blood pressure 122/100, oxygen saturation 100% on  room air.  GENERAL:  In minimal distress from pain.  HEENT:  Extraocular muscles intact.  Normocephalic, atraumatic.  Mucous  membranes moist.  NECK:  Supple.  Her right cervical lymph nodes enlarged.  Catheter site  tender with mild swelling.  CARDIOVASCULAR:  Regular rate and rhythm.  LUNGS:  Clear to auscultation bilaterally.  SKIN:  No rash.  No lesions.  ABDOMEN:  Bowel sounds normal soft, nontender, nondistended.  EXTREMITIES:  No cyanosis, clubbing, or edema.  NEURO:  Alert and oriented x3, grossly nonfocal.   HOSPITAL COURSE:  1. Dialysis catheter-related infection.  The patient was initially      placed on the vancomycin and ceftazidime.  However, the patient      developed allergic reaction with erythema and itching hence      vancomycin was discontinued. Afterwards, her antibiotic regimen was      changed to cefepime for 1 night and from day 2 was placed  on      Cubicin which she received for 3 days.  Blood cultures grew      methicillin-sensitive Staph aureus.  When sensitivity data came      back, the patient was shifted to Ancef and sent home on IV Ancef      for 2 weeks.  The patient was noted to be febrile during first      night in the hospital, but afterwards she remained afebrile.  2. End-stage renal disease.  The patient's dialysis was continued with      placement of temporary catheter on left internal jugular.  She has      been seen by Interventional Radiology who have arranged for the      patient to return as an outpatient on this coming Monday for      conversion of the temporary IJ catheter to a tunnelled dialysis      catheter.  Patient has agreed to this plan and is being discharged      home with the temporary line remaining in place at the request of      Dr. Craig Staggers of the Radiology service.  3. Bradycardia rate of 40-50 is noted on the last day of hospital      admission.  The patient's clonidine was discontinued.  She did not      have any acute changes on the monitor.  Was not complaining of any      chest pain.  Her blood pressures were stable.  Her blood pressure      medication has been changed to enalapril.  This will have to be      followed up on outpatient basis.  4. Liver dysfunction.  Liver functions were noted to be slightly      elevated on the last day of hospital admission; however, she was      completely asymptomatic, not complaining of any abdominal pain.      She was not jaundiced, and was not having any fever.  We decided to      follow her up as outpatient.  5. Hypertension.  As noted above, the patient's clonidine was      discontinued, and she was placed on enalapril.  6. Hyperlipidemia.  The patient's Zocor was continued.  7. Anemia.  The patient's Venofer and erythropoietin was continued.      Zara Council, MD  Electronically Signed      Maree Krabbe, M.D.  Electronically  Signed    AS/MEDQ  D:  04/09/2008  T:  04/10/2008  Job:  [id number]

## 2010-12-25 NOTE — Consult Note (Signed)
VASCULAR SURGERY CONSULTATION   [name], ANNAS [name]  DOB:  08-17-1975                                       02/26/2007  ZOXWR#:[id number]   REASON FOR CONSULTATION:  End stage renal failure, referred for  placement of permanent hemodialysis access.   HISTORY:  The patient is a 35 year old female with chronic renal  insufficiency.  She is referred by Dr. Eliott [name] for placement of an AV  fistula post vein mapping.   PAST MEDICAL HISTORY:  1. Hypertension.  2. Hyperlipidemia.   SOCIAL HISTORY:  The patient is single, 3 children.  She is not working.  She smokes 1 pack of cigarettes daily.  Does not consume alcohol on a  regular basis.   REVIEW OF SYSTEMS:  She notes general weight loss and anorexia, has some  occasional headaches.  No other complaints.   MEDICATIONS:  1. Enalapril 1 tablet daily.  2. Ambien q.h.s. p.r.n.  3. Xanax p.r.n.  4. Lasix one tablet daily.   ALLERGIES:  None known.   PHYSICAL EXAMINATION:  General:  A 35 year old female, appears  approximately her stated age, no acute distress.  Vital signs:  BP  160/100, pulse is 76 per minute regular, respirations 16 per minute.  Upper extremities:  Two plus brachial and radial pulses bilaterally.   INVESTIGATIONS:  Venous mapping of the upper extremities bilaterally  reveals small veins in the left upper extremity.  Right upper arm  cephalic vein, however, is 30-to-34-mm.   IMPRESSION:  1. Chronic renal insufficiency.  2. Hypertension.  3. Hyperlipidemia.  4. Tobacco abuse.   RECOMMENDATIONS:  Placement of right arm brachial cephalic arteriovenous  fistula on March 06, 2007 at [facility].  The patient is placed  on 81 mg of aspirin daily.   [name], M.[name].  Electronically Signed  PGH/MEDQ  [name]:  02/26/2007  T:  02/27/2007  Job:  [id number]

## 2010-12-25 NOTE — Op Note (Signed)
[name], [name] NO.:  [id number]   MEDICAL RECORD NO.:  [id number]          PATIENT TYPE:  INP   LOCATION:  5506                         FACILITY:  [facility]   PHYSICIAN:  Balinda Quails, M.D.    DATE OF BIRTH:  03/15/1976   DATE OF PROCEDURE:  11/29/2008  DATE OF DISCHARGE:                               OPERATIVE REPORT   SURGEON:  Balinda Quails, MD   ASSISTANT:  Nurse.   ANESTHETIC:  Local with MAC.   ANESTHESIOLOGIST:  Kaylyn Layer. Michelle Piper, MD   PREOPERATIVE DIAGNOSIS:  End-stage renal failure.   POSTOPERATIVE DIAGNOSIS:  End-stage renal failure.   PROCEDURE:  1. Ultrasound, right common femoral vein.  2. Insertion of right femoral Diatek catheter.  3. Removal of right internal jugular Diatek catheter.   OPERATIVE PROCEDURE:  The patient was brought to the operating room in  stable condition.  Placed in supine position.  In right groin, an  ultrasound carried out.  This revealed patent right common femoral vein  with normal compressibility.   Right groin was prepped and draped in sterile fashion.  Skin and  subcutaneous tissues were instilled with 1% Xylocaine.  An 18-gauge  needle was introduced in the right common femoral vein.  A 0.035 J-wire  was advanced through the needle into the inferior vena cava under  fluoroscopy.  Site opened with 11 blade.  Dilator was passed over  guidewire.  A 16 dilator abd tearaway sheath advanced over guidewire.  The dilator removed.  The Diatek catheter was then advanced over  guidewire to the inferior vena cava and right atrial junction.  The  guidewire removed and the tearaway sheath removed.  A subcutaneous  tunnel created.  The catheter brought through the tunnel and divided and  hub mechanism assembled.  The insertion site closed with interrupted 3-0  nylon suture.  Catheter fixed to skin with interrupted 2-0 silk suture.  Flushed with heparin saline and capped with heparin solution.  No  apparent  complications.   Attention then placed on the right internal jugular Diatek catheter.  The site was prepped and draped in sterile fashion.  The stay suture was  incised.  Tension placed on the catheter which was removed in total  including the cuff.  The site closed with a single horizontal mattress 3-  0 silk.  Sterile dressings applied.  No apparent complications.  Transferred recovery room in stable condition.      Balinda Quails, M.D.  Electronically Signed     PGH/MEDQ  D:  11/30/2008  T:  12/01/2008  Job:  [id number]

## 2010-12-25 NOTE — Op Note (Signed)
NAMEISRAA, [name]           ACCOUNT NO.:  [id number]   MEDICAL RECORD NO.:  [id number]          PATIENT TYPE:  EMS   LOCATION:  MAJO                         FACILITY:  [facility]   PHYSICIAN:  Juleen China IV, MDDATE OF BIRTH:  28-Sep-1975   DATE OF PROCEDURE:  05/31/2008  DATE OF DISCHARGE:  05/25/2008                               OPERATIVE REPORT   PREOPERATIVE DIAGNOSIS:  Left arm claudication.   POSTOPERATIVE DIAGNOSIS:  Left arm claudication.   PROCEDURE PERFORMED:  1. Ultrasound-guided access right common femoral artery.  2. Aortic arch angiogram.  3. Second-order catheterization (left axillary artery).  4. Left upper extremity angiogram.  5. Closure device (StarClose).   PROCEDURE:  The patient was identified in the holding area and taken to  the room where she was placed supine on table.  Bilateral groins were  prepped and draped in a standard sterile fashion.  A time-out was  called.  The right common femoral artery was evaluated with ultrasound  and was found to be widely patent.  Lidocaine 1% was used for local  anesthesia.  Right common femoral artery is accessed under ultrasound  guidance with an 18-gauge needle.  A 035 Bentson wire was advanced in  the aorta under fluoroscopic visualization.  A 5-French sheath was  placed.  Over the wire, a pigtail catheter was advanced in the ascending  aorta and an aortic arch angiogram was obtained.  Next, using a  Bernstein II catheter and a Bentson wire, the left subclavian artery was  accessed.  Catheter was then advanced out into the left axillary artery  and left arm angiogram was obtained.   FINDINGS:  Aortic arch:  A type 2 aortic arch was visualized.  The  innominate artery is visualized and widely patent.  The right subclavian  artery is widely patent.  The right vertebral artery arises off the  right subclavian and is widely patent.  The right common carotid artery  is widely patent.  The left common carotid  artery arises near the origin  of the innominate artery, but does have a separate origin.  It is widely  patent.  The left subclavian artery is widely patent.  The vertebral  artery appears to arise from the left subclavian artery.  The left  axillary artery is widely patent.   Left arm angiogram:  The left axillary artery is widely patent.  The  left brachial artery is very small in caliber measuring approximately 2  mm.  In the level of the mid humerus, the brachial artery is occluded.  There is a large collateral which leads to reconstitution of brachial  artery at the level of the antecubital crease.  Radial and ulnar artery  are patent.  They are very small in caliber.  The interosseous artery is  also patent.  Both the radial and ulnar artery crossed the wrist and  intact palmar arch.  Digital arteries appeared patent.   The patient's previous bypass graft is not visualized and is presumed  occluded.   After the above images were obtained, decision was made to stop the  procedure.  Catheters and  wires were removed.  A StarClose was  successfully deployed in the right groin.  The patient was taken to the  holding area for recovery.  There was no complications.   IMPRESSION:  1. Type 2 aortic arch.  2. Occluded left brachial artery bypass.           ______________________________  V. Charlena Cross, MD  Electronically Signed     VWB/MEDQ  D:  05/31/2008  T:  05/31/2008  Job:  [id number]

## 2010-12-25 NOTE — Op Note (Signed)
[name], [name]           ACCOUNT NO.:  [id number]   MEDICAL RECORD NO.:  [id number]          PATIENT TYPE:  AMB   LOCATION:  SDS                          FACILITY:  [facility]   PHYSICIAN:  Balinda Quails, M.D.    DATE OF BIRTH:  Apr 20, 1976   DATE OF PROCEDURE:  06/26/2007  DATE OF DISCHARGE:  06/26/2007                               OPERATIVE REPORT   SURGEON:  Balinda Quails, M.D.   ASSISTANT:  Nurse.   ANESTHESIA:  Local with MAC.   PREOPERATIVE DIAGNOSIS:  Chronic renal insufficiency.   POSTOPERATIVE DIAGNOSIS:  Chronic renal insufficiency.   PROCEDURE:  Left brachiocephalic arteriovenous fistula.   OPERATIVE PROCEDURE:  The patient was brought to the operating room in  stable condition.  She was placed in the supine position.  The left arm  was prepped and draped in a sterile fashion.  The skin and subcutaneous  tissues was instilled with 1% Xylocaine.  A longitudinal skin incision  was made over the cephalic vein at the anatomical snuff box of the left  wrist.  Dissection was carried down through the subcutaneous tissue.  The cephalic vein was identified at this level and was very small, less  than 2 mm in size.  It was felt to be too small for placement of a  Cimino fistula.   A second skin incision was made in the left antecubital fossa.  A  transverse incision was carried down through the subcutaneous tissue.  The cephalic vein was identified.  At this level, it was 4 mm in size.  The vein was freed.  Tributaries were ligated with 4-0 silk and divided.  Ligated distally with 3-0 silk and divided.  An adequate length of vein  was mobilized.  The brachial artery was then freed and encircled with  vessel loops proximally and distally.  The patient was administered 3000  units heparin intravenously.   The brachial artery was controlled proximally and distally with  serrefine clamps.  A longitudinal arteriotomy was made.  The cephalic  vein was anastomosed  end-to-side to the brachial artery using running 7-  0 Prolene suture.  The clamps were then removed, there was excellent  flow present.  Adequate hemostasis was obtained.  Sponge and instrument  counts were correct.  The subcutaneous tissue was closed with a running  3-0 Vicryl suture and the skin closed with 4-0 Monocryl.  Dermabond was  applied.  There were no apparent complications.  1+ left radial pulse  was present at the termination of the procedure.  The patient was  transferred to the recovery room in stable condition.     Balinda Quails, M.D.  Electronically Signed    PGH/MEDQ  D:  06/26/2007  T:  06/27/2007  Job:  [id number]

## 2010-12-25 NOTE — Op Note (Signed)
[name], [name] NO.:  [id number]   MEDICAL RECORD NO.:  [id number]          PATIENT TYPE:  OBV   LOCATION:  2003                         FACILITY:  [facility]   PHYSICIAN:  Balinda Quails, M.D.    DATE OF BIRTH:  [birth year]   DATE OF PROCEDURE:  10/06/2007  DATE OF DISCHARGE:                               OPERATIVE REPORT   SURGEON:  Balinda Quails, M.D.   ASSISTANT:  Cyndy Freeze, P.A.-C.   ANESTHESIA:  General with LMA.   ANESTHESIOLOGIST:  Bedelia Person, M.D.   PREOPERATIVE DIAGNOSIS:  1. Chronic renal insufficiency.  2. Occluded left upper arm arteriovenous graft.  3. Occluded left brachial artery with severe left upper extremity      claudication.   POSTOPERATIVE DIAGNOSIS:  1. Chronic renal insufficiency.  2. Occluded left upper arm arteriovenous graft.  3. Occluded left brachial artery with severe left upper extremity      claudication.   PROCEDURE:  Repair of left brachial artery with reverse saphenous vein  interposition graft.   CLINICAL NOTE:  [name] is a 35 year old female with chronic  renal insufficiency approaching hemodialysis.  She has had multiple  previous hemodialysis access procedures.  She had a right arm  arteriovenous fistula created which caused steal and required ligation.  She has had a left upper arm arteriovenous graft placed.  This was  performed in December 26 of last year.  During a recent nephrology  visit, she was noted to have an occluded graft.  She was seen in the  office last week and the graft was found to be occluded in the left  upper arm.  On further questioning the patient describes severe  claudication symptoms in her left arm.  She described claudication and  early fatigue.  Workup for this including duplex of her left brachial  artery revealed it to be occluded.  She had no palpable left radial  pulse.  Arteriography was not perform preoperatively due to the  patient's chronic renal  insufficiency.  She is brought to the operating  room at this time for exploration of the left brachial artery and  repair.   OPERATIVE PROCEDURE:  The patient was brought to the operating room in  stable hemodynamic condition.  She was placed under general anesthesia  with LMA.  Her left arm was prepped and draped in a sterile fashion.  The skin and subcutaneous tissues was instilled with 1% Xylocaine with  epinephrine.  A longitudinal skin incision was made through the scar  over the brachial artery just proximal to the left antecubital fossa.  Dissection was carried down through subcutaneous tissue.  The left  brachial artery was exposed.  The left brachial to Gore-Tex graft  anastomosis was exposed.  The graft was occluded.  The left brachial  artery also was occluded.  It appeared to be fibrosed and chronically  occluded.  The artery was fairly small in caliber, approximately 2.5 to  3 mm.  The artery was exposed proximally up to the level of the mid  humerus.  At this level, the artery appeared more normal, though  there  was no palpable pulse.  The brachial artery was then divided  transversely.  A segment excised which revealed chronic organized  fibrotic thrombus.  The proximal brachial artery was then  thromboendarterectomized and pulsatile arterial inflow was obtained.  A  #3 Fogarty passed up the artery without return of any further thrombus.  The patient was administered 5000 units heparin intravenously followed  by an additional 1000 units heparin intravenously.   The distal artery was then exposed and this was also thrombosed distal  to the anastomosis.  A second longitudinal skin incision was then made  through the antecubital fossa.  The distal left brachial artery just  proximal to its bifurcation in radial and ulnar was exposed.  This was a  very small vessel, 2 mm in size, but appeared to be patent with a  visible lumen.  The left leg was then prepped and draped in a  sterile  fashion.  A longitudinal skin incision was made over the course of the  saphenous vein at the knee joint.  The saphenous vein was exposed.  This  was a good match at this level for the brachial artery.  Approximately 3  mm in size.  The vein was exposed for an adequate length.  Tributaries  were ligated with 3-0 and 4-0 silk and divided.  The vein was ligated  proximally and distally with clips, divided, and removed.  The vein was  dilated in reversed fashion.  There was a fairly good size match and  uniformity to the vein.   The brachial artery was then trimmed proximally to what appeared to be  good vessel and an end-to-end anastomosis carried out between the  brachial artery and the reverse saphenous vein graft using running 7-0  Prolene suture in the anterior and posterior walls.  The vein graft was  then flushed.  Clamps were removed proximally and excellent flow was  present through the graft.  The graft was brought down to the left  brachial artery which was exposed distally.  A longitudinal arteriotomy  was made in the anterior wall and a 3 Fogarty passed down the vessel  without return of thrombus.  Good back bleeding was obtained.  The  reverse vein was then beveled and anastomosed end-to-side to the distal  left brachial artery using running 7-0 Prolene suture.  The vein was  then flushed.  Clamps were removed.  Excellent flow present.   An intraoperative arteriogram was obtained with injection of 20 mL of  contrast solution.  This was felt to be indicated due to the small  vessels which were present and to assure that there was good outflow  into the hand.  This revealed patent vein graft with good outflow into  the hand via the radial and ulnar arteries.  The median nerve was  preserved throughout the case and exposed throughout the dissection.  There were no apparent complications.  The subcutaneous tissue was then  closed in the leg incision with running 2-0  Vicryl suture in two layers  and staples applied to skin.  The arm incisions were then closed with  running 3-0 Vicryl suture in two subcutaneous layers and staples applied  to skin.  At the termination of the procedure, the patient had a well  perfused left hand and was transferred to the recovery room in stable  condition.      Balinda Quails, M.D.  Electronically Signed     PGH/MEDQ  D:  10/06/2007  T:  10/06/2007  Job:  [id number]

## 2010-12-28 NOTE — H&P (Signed)
[facility]  Patient:    [name], [name]                    MRN: [id number] Adm. Date:  [id number] Attending:  Michaele Offer                         History and Physical  CHIEF COMPLAINT:              This patient is a 35 year old white female para 1, 0-2-1, gravida 4, estimated gestational age [redacted] weeks by an eight week ultrasound, with estimated date of confinement of Dec 28, 2000, presenting with rupture of membranes with clear fluid at approximately 10:30 p.m. on November 02, 2000.  HISTORY OF PRESENT ILLNESS:   She had no contractions, no vaginal bleeding, good fetal movement.  Blood type was B-positive with a negative antibody, RPR nonreactive.  Rubella was equivocal.  Hepatitis B surface antigen negative. HIV negative.  GC and Chlamydia negative.  Triple screen normal.  Her prenatal course was complicated by twins with recent discordance by ultrasound - baby A estimated fetal weight approximately 1500 g with increased amniotic fluid volume and baby B estimated fetal weight 1270 g with decreased amniotic fluid volume.  PAST OBSTETRICAL HISTORY:     1. In 1998 the patient delivered spontaneously                                  at 42 weeks a seven pound infant.                               2. In 2000 and 2001 she had early abortion and                                  missed abortion with dilatation and curettage                                  respectively.  PAST MEDICAL HISTORY:         Migraine headaches.  PAST SURGICAL HISTORY:        Negative.  ALLERGIES:                    No known drug allergies.  CURRENT MEDICATIONS:          1. Folic acid 1 mg.                               2. Iron.  SOCIAL HISTORY:               The patient is married.  No tobacco use.  PHYSICAL EXAMINATION:  VITAL SIGNS:                  The patient was afebrile with normal vital signs.  HEENT:                        Head, eyes, ears, nose and  throat normal.  Nose and pharynx clear.  LUNGS:  Clear.  HEART:                        Normal size and sounds.  ABDOMEN:                      Gravid, nontender.  Fundal height had been 40 cm in the office.  PELVIC:                       Vaginal examination in maternity admission with a speculum showed positive nitrazine and positive ferning.  Digital examination was deferred.  LABORATORY DATA:              Ultrasound showed breech and vertex with 2.8 cm cervix.  ADMISSION IMPRESSION:         Intrauterine pregnancy at 32 weeks with preterm premature rupture of membranes, irregular contractions, no evidence of preterm labor or chorioamnionitis.  She was on penicillin and Zithromax, given betamethasone.  Dr. Jackelyn Knife discussed the situation with the patient, to manage her expectantly.  Would not give tocolytics due to rupture of membranes.  The patient was made aware that she would require cesarean section for delivery.  She had a reactive nonstress test on both twins by Dr. Jackelyn Knife, felt she could possibly be developing a twin-twin transfusion syndrome by her last ultrasound.  At 7:25 a.m. on November 03, 2000 the patient was feeling some mild contractions, occasional fluid leakage, she was afebrile, fetal heart tones were reactive, and there was no indication for delivery.  At approximately 10:30 a.m. the nurse called me and stated that the patient was contracting.  She was 1-2 cm dilated and 70% effaced.  At approximately 12:30 p.m. the nurse called me back and stated that she had tried to examine the patient because she was contracting and could not tell what the cervix effacement and dilatation was.  She had another nurse check behind her and that nurse reported the patient was 3-4 cm dilated, 90% effaced, and there was something coming through the cervix.  I immediately came to the hospital and examined the patient and found that she was 3-4 cm dilated and  she had a prolapsed cord.  We made immediate preparations to proceed with cesarean section and the patient underwent low transverse cervical cesarean section.  ADMITTING IMPRESSION PRIOR TO DELIVERY: Intrauterine pregnancy at 32 weeks with prolapsed umbilical cord for twin A, and preparation made for immediate cesarean section. DD:  11/03/00 TD:  11/04/00 Job: [id number]

## 2010-12-28 NOTE — Discharge Summary (Signed)
NAMELAUNA, GOEDKEN           ACCOUNT NO.:  [id number]   MEDICAL RECORD NO.:  [id number]          PATIENT TYPE:  INP   LOCATION:  5735                         FACILITY:  [facility]   PHYSICIAN:  Thornton Park. Daphine Deutscher, MD  DATE OF BIRTH:  Apr 01, 1976   DATE OF ADMISSION:  09/20/2006  DATE OF DISCHARGE:  09/22/2006                               DISCHARGE SUMMARY   ADMISSION DIAGNOSIS:  Acute appendicitis.   PROCEDURE:  Laparoscopic appendectomy.   HOSPITAL COURSE:  The patient had a laparoscopic appendectomy on  February 9 at [facility].  She was seen initially at [facility]  and transferred to [facility], where she underwent her surgery.  Because of  the bed situations, she was transferred to [facility], where she was seen by  the renal doctors, thought to possibly have chronic glomerulonephritis,  and in the hospital, her kidney function seemed to be stable to getting  better.  She was ready for discharge on February 11, doing well, and was  given Vicodin for pain, and she was going to follow up with the renal  doctors.  Her pathology report confirmed acute suppurative appendicitis.  She seemed to do well during the hospitalization and will follow up in  the office.      Thornton Park Daphine Deutscher, MD  Electronically Signed     MBM/MEDQ  D:  11/10/2006  T:  11/10/2006  Job:  [id number]

## 2010-12-28 NOTE — Consult Note (Signed)
NAMEKIRSTI, [name]           ACCOUNT NO.:  [id number]   MEDICAL RECORD NO.:  [id number]          PATIENT TYPE:  INP   LOCATION:  9318                          FACILITY:  [facility]   PHYSICIAN:  John C. Madilyn Fireman, M.D.    DATE OF BIRTH:  1975-11-09   DATE OF CONSULTATION:  DATE OF DISCHARGE:                                   CONSULTATION   REASON FOR CONSULTATION:  Abdominal pain, elevated liver function test.   HISTORY OF ILLNESS:  The patient is a 35 year old black female, who is [redacted]  weeks pregnant, who presented with right lower quadrant abdominal pain that  awakened her from sleep yesterday.  This has not been associated with any  nausea, vomiting, or fever.  She has not had any bowel movements since the  pain began.  The pain is non-radiating, but unrelenting, although temporary  relief by Dilaudid.  On admission, she had normal a white blood cell count  but a slightly elevated liver function test, with alkaline phosphatase 209,  ALT 93, AST 35, normal bilirubin.  Today, her alkaline phosphatase is 254,  ALT 113, AST 157, bilirubin still normal at 0.4.  She has had an abdominal  CT scan, which shows a surgically absent gallbladder, no evidence of  appendicitis, and mild right pelvic caliectasis with probable mild right  lower pole pyelonephritis.  This is her fourth pregnancy and she has had no  known problems with liver enzymes in previous pregnancies.   PAST MEDICAL HISTORY:  Essentially unremarkable.   PAST SURGICAL HISTORY:  She had a cholecystectomy in 2002, laparoscopically,  and did have stones, and C-section times one.   SOCIAL HISTORY:  The patient admits to cigarette smoking.  She denies  alcohol use.  She denies chronic liver disease or high risk behaviors for  hepatitis.   MEDICATIONS:  None.   ALLERGIES:  None.   PHYSICAL EXAMINATION:  GENERAL:  A well-developed, well-nourished black  female, in no acute distress.  HEART:  Regular rate and rhythm, without  murmur.  LUNGS:  Clear.  ABDOMEN:  Soft, nondistended, with normoactive bowel sounds.  No  hepatosplenomegaly or mass.  There is tenderness mainly in the right lower  quadrant to right mid-abdomen, barely tender at all in the right upper  quadrant and suprapubic area.  The left upper quadrant and left lower  quadrant and epigastric are relatively nontender.  There is some rebound  tenderness in the right lower quadrant.   IMPRESSION:  A difficult picture to put together, with right lower quadrant  abdominal pain, no diarrhea, fever, nausea, vomiting or rectal bleeding,  with a CT scan showing no direct suggestion of appendicitis.  Unclear of the  significance of the pelvic caliectasis, but renal consulting regarding this.   PLAN:  Will continue to monitor liver function tests and treat  symptomatically with pain medicine.  It is reasonable to check hepatitis B  and C status, although these would not likely cause acute right lower  quadrant pain.  If pain, particularly rebound tenderness, persist in the  right lower quadrant, she probably should be examined by a general  surgeon  to more conclusively rule out appendicitis.  Will follow with you.           ______________________________  Everardo All. Madilyn Fireman, M.D.     JCH/MEDQ  D:  04/17/2006  T:  04/17/2006  Job:  [id number]   cc:   Zenaida Niece, M.D.  Fax: [phone]

## 2010-12-28 NOTE — Op Note (Signed)
NAMEJOCILYNN, GRADE           ACCOUNT NO.:  [id number]   MEDICAL RECORD NO.:  [id number]          PATIENT TYPE:  INP   LOCATION:  5735                         FACILITY:  [facility]   PHYSICIAN:  Thornton Park. Daphine Deutscher, MD  DATE OF BIRTH:  Nov 23, 1975   DATE OF PROCEDURE:  09/20/2006  DATE OF DISCHARGE:                               OPERATIVE REPORT   PREOPERATIVE DIAGNOSIS:  Acute appendicitis.   POSTOPERATIVE DIAGNOSIS:  Acute appendicitis.   SURGEON:  Luretha Murphy, M.D.   ASSISTANT:  None.   ANESTHESIA:  General.   DESCRIPTION OF PROCEDURE:  Fusaye Wachtel is a 35 year old white  female, who apparently presented to [facility] first and was  transferred over to [facility] where a CT scan had been performed and read  by Abelino Derrick, showing early acute appendicitis.  I was called and saw  her in the ED where I obtained informed consent.  Of note is that she  has this chronic worsening renal failure.   The patient was taken to room 1 on the evening of Saturday, September 20, 2006, given general anesthesia.  The abdomen was prepped with [facility] and draped sterilely.  A longitudinal incision was made down into  the umbilicus and through that, I entered with the Hasson technique  without difficulty.  The abdomen was insufflated.  The 5 was placed in  the right upper quadrant and a 10-11 in the left lower quadrant and  through these, put the scope in the left lower quadrant and was able to  mobilize the appendix which was stuck down a little bit and appeared  turgid and erythematous but no purulence.  I mobilized the base, going  through the mesentery with the harmonic scalpel and then once the base  was mobilized, I went across it with an Endo GIA red cartridge and put  the appendix in a pouch and brought it out through the umbilicus without  spillage.  I instructed the appendiceal base, and there was no evidence  of bleeding.  No other abnormalities were noted.  The  patient has had a  previous laparoscopic cholecystectomy.  After surveying the abdomen, I  repaired the umbilical defect with 2 sutures of 0 Vicryl under  laparoscopic vision and then deflated the abdomen through the trocars,  closed the skin with 4-0 Vicryl, Benzoin, and Steri-Strips.  The patient  tolerated the procedure well and was taken to the recovery room where  she will likely stay overnight since there are no beds in the hospital.      Thornton Park. Daphine Deutscher, MD  Electronically Signed     MBM/MEDQ  D:  09/20/2006  T:  09/21/2006  Job:  [id number]

## 2010-12-28 NOTE — Op Note (Signed)
[facility]  Patient:    [name], [name]                    MRN: [id number] Proc. Date: 11/03/00 Adm. Date:  [id number] Attending:  Michaele Offer                           Operative Report  PREOPERATIVE DIAGNOSES:       1. Intrauterine pregnancy at 32 weeks, twins.                               2. Preterm premature rupture of membranes.                               3. Active labor.                               4. Prolapsed cord.                               5. Breech/vertex.  POSTOPERATIVE DIAGNOSES:      1. Intrauterine pregnancy at 32 weeks, twins.                               2. Preterm premature rupture of membranes.                               3. Active labor.                               4. Prolapsed cord.                               5. Breech/vertex; but twin B was transverse.  OPERATION/PROCEDURE:          Low transverse cervical cesarean section.                               a. Breech delivery footling breech, female;                                  weight three pounds 15 ounces.  Apgar scores                                  of baby A were 8 at one minute and 9 at five                                  minutes.                               b. Baby B was transverse lie, female, weight  three pounds nine ounces.  Apgar scores of                                  baby B were 2 at one minute and 8 at five                                  minutes.  Arterial cord pH of baby B 7.32.                                  I asked Dr. Rosanne Sack how long it was before                                  the babys Apgar score was close to 8 and she                                  responded that the baby was breathing at one                                  minute and probably had an Apgar score of 8                                  by two minutes.  Did not require ventilation,  only bag.  Baby B was delivered as breech                                  after conversion from transverse lie.  There                                  was some entrapment of the head of baby B.  I                                  was able to deliver baby Bs head by placing                                  my hand around the entire babys head.  SURGEON:                      Malachi Pro. Ambrose Mantle, M.D.  ANESTHESIA:                   General.  ESTIMATED BLOOD LOSS:         About 1000 cc.  DESCRIPTION OF PROCEDURE:     The patient was brought to the operating room and the abdomen prepped with Betadine solution.  A Foley catheter was inserted to straight drain after prepping the urethra.  A nurse had kept her hand in the vagina during all the preliminary work and felt that the fetal heart rate  of the prolapsed cord remained above 120.  The patient was then placed in the left lateral tilt position and the abdomen draped as a sterile field.  General anesthesia was instituted and intubation was done.  A transverse incision was made and carried down in layers through the skin, subcutaneous tissue, and fascia.  The fascia was separated from the rectus muscles superiorly and inferiorly.  The rectus muscles were split in the midline and the peritoneum opened vertically.  Exposure of the lower uterine segment allowed incision of the lower uterine segment peritoneum and it was extended laterally and the bladder pushed inferiorly.  The bladder was then retracted away and incision made into the lower uterine segment and then extended laterally with bandage scissors on both sides.  The first infant was presenting as a footling breech, double footling.  The baby was delivered without difficulty as a breech.  The cord was clamped and cut and the infant given to the neonatologist, Dr. Rosanne Sack, who was in attendance.  An amniotomy was done on baby Bs sac.  It was confirmed that the baby was in transverse  position.  Ultrasound had suggested vertex but it definitely was not.  I was then able to establish the babys buttocks.  I pulled the buttocks down through the incisional opening along with the feet and delivered the baby up to the neck, but the baby had somewhat of an entrapment of the head at this point.  I rotated the baby in both directions, trying to free the babys head, and was unable to do so, so I reached in through the uterine incision and established the correct position of the head, and then brought the head out with my hand around the entire head because it was a somewhat small premature head.  The cord was clamped and cut and the infant given to the neonatologist.  She assigned Apgar scores of 8 at one minute and 9 at five minutes on baby A and Apgar scores of 2 at one minute and 8 at five minutes on baby B.  The placenta was removed and the inside of the uterus was inspected and found to be free of any remaining products of conception.  An arterial cord pH was done on baby B and it was 7.32.  Routine blood studies were done on baby A and baby B.  The uterine incision was then closed in layers using a running locked suture of 0 Vicryl in the first layer and nonlocking suture of the same material on the second layer, and interrupted figure-of-eight suture required for complete hemostasis.  Liberal irrigation confirmed hemostasis.  All blood clots were removed.  The uterus, which had been exteriorized, was placed back into the abdominal cavity and both tubes and ovaries appeared normal.  The uterus seemed normal without fibroids.  The abdominal wall was then closed.  The pelvic peritoneum and parietal peritoneum were not reapproximated.  The abdomen was closed with interrupted sutures on the rectus muscles, two running sutures of 0 Vicryl in the rectus fascia, running 3-0 Vicryl in the subcutaneous tissue, and staples on the skin.  The patient seemed to tolerated the procedure  well.  Blood loss was thought to be about 1000 cc.  Sponge and needle counts were correct.  She was returned to the recovery room in satisfactory condition. DD:  11/03/00 TD:  11/04/00 Job: [id number]

## 2010-12-28 NOTE — Discharge Summary (Signed)
[facility]  Patient:    [name], [name]                    MRN: [id number] Adm. Date:  [id number] Disc. Date: [id number] Attending:  Michaele Offer                           Discharge Summary  HISTORY:                      A 35 year old white female, para 1-0-2-1, gravida 4, estimated gestational age [redacted] weeks by an 8-week ultrasound, presented with ruptured membranes with clear fluid at approximately 10:30 p.m. on November 02, 2000. The patient had no contractions, no vaginal bleeding, and good fetal movement. Blood type was B positive with a negative antibody, RPR nonreactive, rubella was equivocal, hepatitis B surface antigen negative, HIV negative, GC and Chlamydia negative, and triple screen normal. Prenatal course was complicated by recent discordance by ultrasound of the weights of Baby A and Baby B.  PAST MEDICAL HISTORY:         Outlined in her history and physical.  HOSPITAL COURSE:              She went into labor and at approximately 12:30 p.m. on November 03, 2000 the nurse called me and stated that she had tried to examine the patient because the patient was contracting and could not tell what the cervical effacement and dilatation was. She had another nurse check behind her and that nurse the reported the patient was 3-4 cm, 90%, and there was something coming through the cervix. I immediately came to the hospital, examined the patient, and found that she was 3-4 cm dilated with a prolapsed cord. The heart rate was normal. We made immediate preparations to proceed with C-section. The patient was taken to the operating room and underwent a low transverse cervical C-section by Dr. Ambrose Mantle with delivery of Baby A, a footling breech with a prolapsed cord, female, 3 pounds 15 ounces, Apgars of 8 and 9 at one and five minutes. Baby B was delivered as a breech after converting it from a transverse lie. It was a female, 3 pounds 9 ounces, and  was given Apgars of 2 at one minute and 8 at five minutes. The arterial cord pH on Baby B was 7.32.  Postoperatively, the patient did very well. She made very rapid progress to resumption of normal bowel, urinary function and ambulation but wanted to stay the full four days because of the premature babies. On the fourth postoperative day her staples were removed, strips were applied. Her incision was healing nicely. The abdomen was soft and nontender. She was ready for discharge.  LABORATORY DATA:              Initial hemoglobin was 11.6, hematocrit 32.5, white count 9600, platelet count 149,000. Followup hemoglobin 9.9, hematocrit 26.5, platelet count 149,000, white count 12,900. RPR was nonreactive.  FINAL DIAGNOSES:              1. Intrauterine pregnancy at 32 weeks, delivered                                  twins by cesarean section.  2. Preterm premature rupture of membranes.                               3. Active labor.                               4. Prolapsed umbilical cord.                               5. Presentation: Baby A double footling breech,                                  presentation Baby B transverse lie.  OPERATION:                    Low transverse cervical cesarean section. Delivery of Baby A as a double footling breech, Baby B as a double footling breech after conversion from a transverse lie.  FINAL CONDITION:              Improved.  DISCHARGE INSTRUCTIONS:       Instructions include our regular discharge instruction booklet. She is to receive her rubella vaccine. The patient is cautioned to avoid heavy lifting and strenuous activity. The patient is to call with any unusual problems, call with any fever above 100.4 degrees or any heavy bleeding that does not stop when she gets off her feet.  DISCHARGE MEDICATIONS:        The patient was given a prescription for Tylenol #3, 16 tablets, one every four to six hours as needed  for pain with one refill. DD:  11/07/00 TD:  11/08/00 Job: [id number]

## 2010-12-28 NOTE — Op Note (Signed)
[facility]  Patient:    [name], [name]                  MRN: [id number] Proc. Date: 03/24/01 Adm. Date:  [id number] Attending:  Arlis Porta CC:         Petra Kuba, M.D.   Operative Report  PREOPERATIVE DIAGNOSIS:  Chronic calculus cholecystitis.  POSTOPERATIVE DIAGNOSIS:  Chronic calculus cholecystitis.  PROCEDURE:  Laparoscopic cholecystectomy with intraoperative cholangiogram.  SURGEON:  Adolph Pollack, M.D.  ASSISTANT:  Sheppard Plumber. Earlene Plater, M.D.  ANESTHESIA:  General.  INDICATIONS:  [name] is a 35 year old female, who has had intermittent bouts of epigastric pain.  She states that it has been getting worse, and now it is quite severe and worse than labor pains.  She was seen in the [facility] Emergency Department and then sent to Dr. Vida Rigger, who ordered an ultrasound  and liver function tests.  The ultrasound demonstrated cholelithiasis, and she was also noted to have some elevations of her liver functions.  She presents now for elective cholecystectomy.  The procedure and risks have been discussed with her.  She would like to try to go home after the surgery if she could.  TECHNIQUE:  She was placed supine on the operating table, and general anesthetic was administered.  The abdomen was sterilely prepped and draped. Local anesthetic was infiltrated in the subumbilical region, and a small subumbilical incision was, incising the skin and subcutaneous tissue sharply. The fascia was identified, and a 1-1.5 cm incision made in the midline fascia. The peritoneal cavity was then entered bluntly under direct vision.  A pursestring suture of 0 Vicryl was placed around the fascial edges, and a Hasson trocar was introduced into the peritoneal cavity.  A pneumoperitoneum was created by insufflation of C02 gas.  Next, a laparoscope  was introduced into the abdominal cavity.  The liver appeared normal.  No  obvious abnormalities of the intestine or stomach were noted.  She was placed in the appropriate position, and then an epigastric incision was made through which a 10/11 mm trocar was placed.  Two 5 mm trocars were placed in the right abdomen.  The fundus of the gallbladder was grasped, and there were filmy adhesions between the duodenum and the gallbladder and were taken down bluntly.  The infundibulum was grasped and then the infundibulum was mobilized using blunt dissection and cautery.  A short cystic duct was noted, and I could see the common hepatic duct joining with the cystic duct and joining to form a common bile duct.  I isolated the cystic duct near its junction with the gallbladder and placed a clip on the neck of the gallbladder.  An incision was made in the gallbladder neck, and a Cholangiocath was passed through the anterior abdominal wall and put into the gallbladder neck and then into the cystic duct.  A cholangiogram was then performed.  Under real time fluoroscopy, contrast material was injected to the cystic duct.  It promptly filled the common hepatic and common bile ducts.  The common bile duct drained rapidly into the duodenum, the common hepatic, and right and left hepatic ducts were noted.  No obvious obstructive lesions were seen.  There was noted to be a short cystic duct.  The Cholangiocath was removed; the cystic duct was clipped three times proximally and then divided sharply.  An anterior branch of the cystic artery was clipped and divided.  Subsequently, the posterior branch of  the cystic artery was clipped and divided.  The gallbladder was then dissected free from the liver bed intact using electrocautery.  The liver bed was irrigated and bleeding points controlled with the cautery.  Once hemostasis was adequate, the gallbladder was removed through the subumbilical incision and the subumbilical fascial defect closed under direct vision by tightening up  and tying down the pursestring suture.  Next, the irrigation fluid was evacuated and the liver bed inspected, and no bleeding or bile leakage was noted.  The trocars were removed, and the pneumoperitoneum was released.  The skin incisions were then closed with 4-0 Monocryl subcuticular stitches followed by Steri-Strips and sterile dressings.  She tolerated the procedure well without any apparent complications and was taken to the recovery room in satisfactory condition. DD:  03/24/01 TD:  03/24/01 Job: [id number]

## 2010-12-28 NOTE — Op Note (Signed)
[facility]  Patient:    [name], [name]                   MRN: [id number] Proc. Date: 02/29/00 Adm. Date:  [id number] Disc. Date: [id number] Attending:  Michaele Offer                           Operative Report  PREOPERATIVE DIAGNOSIS:  Missed abortion.  POSTOPERATIVE DIAGNOSIS:  Missed abortion.  PROCEDURE:  Suction dilation and curettage.  SURGEON:  Zenaida Niece, M.D.  ANESTHESIA:  General endotracheal tube.  ESTIMATED BLOOD LOSS:  Less than 50 cc.  FINDINGS:  Spontaneous abortion, probably in progress as the cervix was easily dilated and good products of conception were obtained.  Her uterus was approximately eight weeks size.  Counts were correct.  DESCRIPTION OF PROCEDURE:  After appropriate informed consent was obtained, the patient was taken to the operating room and placed in the dorsal supine position.  General anesthesia was induced and the chest was placed in low mobile stirrups.  Her perineum and vagina were prepped and draped in the usual sterile fashion and her bladder drained with a red rubber catheter.  A weighted speculum was inserted into the vagina and the anterior lip of the cervix grasped with a single tooth tenaculum.  The uterus sounded to approximately 9 cm.  The cervix was easily dilated to a size 27 Hegar dilator. A #8 suction curet, curved was easily inserted and suction curettage performed with return of good amounts of products of conception.  Sharp curettage was performed which revealed good uterine cry in all quadrants and no further tissue obtained.  Suction curettage revealed a small amount of blood.  All instruments were removed from the vagina.  Bleeding from the _________ was controlled with pressure.  The patient was extubated and taken to the recovery room after tolerating the procedure well. DD:  02/29/00 TD:  03/03/00 Job: [id number]

## 2010-12-28 NOTE — Discharge Summary (Signed)
NAMEJAMETTA, MOOREHEAD NO.:  [id number]   MEDICAL RECORD NO.:  [id number]          PATIENT TYPE:  INP   LOCATION:  9318                          FACILITY:  [facility]   PHYSICIAN:  Malachi Pro. Ambrose Mantle, M.D. DATE OF BIRTH:  1976-07-29   DATE OF ADMISSION:  04/16/2006  DATE OF DISCHARGE:  04/19/2006                                 DISCHARGE SUMMARY   This is a 35 year old female, para 1-1-1-3, gravida 4, at 11 weeks by an  ultrasound, admitted with right lower quadrant and right flank pain.  The  patient was evaluated with CT scan that showed no evidence of appendicitis  but possible pyelonephritis.  The patient was seen in consultation by three  specialists: Gastroenterology, general surgery and nephrology. No absolute  diagnosis was ever made as to the etiology of her pain.  Her pain changed  from a 10/10 down to a 7/10 at the time of discharge. She did have  unexplained elevations of her liver function tests which have now returned  to normal.  She does have chronic renal disease as evidenced by elevated  creatinine, low creatinine clearance, and greater than 1 gram of protein in  her urine. The nephrologist felt that the pain that brought her for  admission had nothing to do with her kidney ailment and felt like biopsy of  her kidney would need to wait the cessation of her pregnancy.   Laboratory data showed an ultrasound of the abdomen status post  cholecystectomy, relatively small kidneys that are otherwise normal, no  acute findings. CT of the abdomen and pelvis showed no evidence of  appendicitis or abscess. Ultrasound of the pelvis showed a live intrauterine  pregnancy at 11 weeks and 1 day on April 16, 2006.  Ovaries appeared  normal. There was mild right renal pelvic caliectasis and probable mild  pyelonephritis involving the lower pole of the right kidney on CT scan;  however, urinalysis showed 0-2 white cells.  Urine culture has been pending  for 2 days, is  not available at this time. A repeat urinalysis on September  6 again showed 0-2 white cells.  The patient's laboratory data: Anti DNA was  1 which for which less than 4 is negative.  ANA qualitative was negative.  Creatinine clearance was 36 mL a minute, total protein in 24 hours was 1372  mg.  Initial white count was 8900 followed by 5900. 4500 and 6200.  Hemoglobins of 12.6, 11.2, 11.2 and 12.6. Platelet counts 141, 125, 122 and  145.  Differential was normal. Electrolytes were always normal.  BUN was 24,  1916 and 21, creatinine 2.1, 1.9, 1.8 and 2.1. AST was 35, 157, 42 and 25.  ALT was 93, 117,40 and 18. Hepatitis B surface antigen negative.  Hepatitis  C antibody was negative. HIV nonreactive. Complement C3 was 104 which was  normal.  Complement C4 was 29 which was normal.  Lipase was 26. Amylase was  93.  Urinalysis did show 30 mg percent of protein.   FINAL DIAGNOSES:  1. Acute onset of right lower quadrant pain, undetermined etiology. No  evidence of appendicitis or ovarian abnormality.  2. Intrauterine pregnancy at 11 weeks and 1 day on April 16, 2006.  3. Chronic renal disease of undetermined etiology, but has been present      for at least 10 years.   CONSULTATIONS:  1. Dr. Eliott Nine from nephrology,  2. Dr. Ezzard Standing and Dr. Lurene Shadow from general surgery/  3. Dr. Randa Evens from GI.   FINAL CONDITION:  Is improved.   INSTRUCTIONS:  Include telling the patient to return all call me if her pain  worsens, that she had some type of abnormality that caused right-sided pain  with elevation in liver function tests, and the etiology was not determined,  that she has chronic renal disease and will need a kidney biopsy at some  point after the pregnancy ends. She is given a prescription for Percocet  5/325 mg 24 tablets one every 4-6 hours as needed for pain.      Malachi Pro. Ambrose Mantle, M.D.  Electronically Signed     TFH/MEDQ  D:  04/19/2006  T:  04/20/2006  Job:  [id number]   cc:    Duke Salvia. Eliott Nine, M.D.  Fax: [phone]   Sandria Bales. Ezzard Standing, M.D.  [address]  [place]  [place] [address]   Llana Aliment. Malon Kindle., M.D.  Fax: [phone]

## 2010-12-28 NOTE — Consult Note (Signed)
[name]           ACCOUNT NO.:  [id number]   MEDICAL RECORD NO.:  [id number]          PATIENT TYPE:  OBV   LOCATION:  9318                          [facility]ACILITY:  [facility]   PHYSICIAN:  [name], M.D.DATE OF BIRTH:  1976-02-16   DATE OF CONSULTATION:  DATE OF DISCHARGE:                                   CONSULTATION   REASON FOR CONSULTATION:  Elevated serum creatinine.   HISTORY OF PRESENT ILLNESS:  We are asked to see this 35 year old white  female with an eleven week intrauterine pregnancy who was admitted with  right upper quadrant and right lower quadrant pain who has an elevated serum  creatinine and some question of pyelonephritis by CT.  The patient states  that she was not aware of any prior renal problems except for the presence  of proteinuria at the time of her first child's birth in 1998.  She states  that she completed a 24-hour urine collection which we have no results on  and that there was no followup done.   On review of E chart, she has had dip stick proteinuria since at least 2003  and, in October of 2003, had a serum creatinine of 1.6.  all urinalyses done  since that time, June of 2005, June of 2006, May of 2007 and August of 2007,  showed anywhere between 100-300 mg% protein with variable numbers of red  cells, usually less than 10.   On the present occasion, she was admitted with right upper quadrant pain,  vomiting and some LFT abnormalities.  Her serum creatinine initially was 2.1  and, after some IV fluids, dropped to 1.9.  Of note, creatinine was 2.3 in  August 2007.   She had an abdominal ultrasound done but there is no mention of kidney sizes  or echogenicity.  A CT scan of abdomen demonstrated some mild right  pelvocaliectasis and a small area of decreased parenchymal enhancement in  the lower pole of the right kidney possibly representing focal  pyelonephritis.  Dr. Vernie Ammons was consulted by phone and did not feel  obstructive  uropathy was an issue.   The patient has also over the past month developed some elevation in  alkaline phosphatase, SGOT and SGPT with an alkaline phosphatase that is  currently 254, SGOT of 157 and SGPT of 117.  Platelets have been slightly  decreased but have been chronically low.  With regard to her LFTs, the  patient is status post cholecystectomy in the past.   She has had no prior therapy or diagnosis of hypertension but blood  pressures on various Emergency Department visits have been elevated as high  as 142/84 and 138/97 just in the past six months and she has, considering  her pregnancy, been hypertensive since admission here.   PAST MEDICAL HISTORY:  1. Migraine headaches.  2. History of prior early and missed abortions status post D&Cs in the      past.  3. Status post cholecystectomy.  4. History of ongoing tobacco use.   FAMILY HISTORY:  Positive for hypertension and diabetes in maternal aunts  and uncles.  She  has an aunt who has lupus.  There is no history of renal  disease or stone disease.  She is a resident of [place].  She is  separated and soon to be divorced from her current husband.  She smokes  three to four cigarettes a day, denies the use of alcohol or IV drugs and  states she has no risk factors for HIV exposure.   REVIEW OF SYSTEMS:  Positive for right upper quadrant and right lower  quadrant pain with vomiting.  She has occasional rashes during the summer  but no malar rash or hair loss.  No oral ulcers.  She denies dysuria, tea-  or coca-cola-colored urine or hematuria.  She has had no edema.  There has  been no foaminess to her urine or reduction in urine output.  She denies  specifically flank pain but does report the right upper quadrant, right  lower quadrant pain.   PHYSICAL EXAMINATION:  GENERAL:  She is a somewhat ancy appearing, tanned, white female.   VITAL SIGNS:  Blood pressure 141/84.  She is afebrile.   SKIN:  She has  tattoos on her neck, hips and both lower extremities but no  rashes.   NECK:  She has no JVD and no lymphadenopathy.   LUNGS:  Clear.   CARDIAC:  Exam reveals a normal S1, S2.  No S3.   ABDOMEN:  She is nondistended and bowel sounds are present but she has  moderate right upper quadrant and right lower quadrant tenderness to  palpation.   BACK:  There is no CVA, spinous or paraspinous tenderness.   EXTREMITIES:  There is no edema of the lower extremities.  Distal pulses are  full and symmetric.   LABORATORY DATA:  Laboratory from today shows a sodium 135, potassium 4.2,  chloride 104, CO2 27, BUN 19, creatinine 1.9.  Albumin is low at 2.2.  Urinalysis showed 30 mg% protein with no red cells.  Alkaline phosphatase is  254 (up from 77 in August and 209 on admission), SGOT is 157 (17 in August,  35 on admission) and SGPT is 117 (15 in August, 93 in September).  Lipase is  normal at 26.  Hemoglobin 11.2, platelets 125,000.   CT scan shows no definite evidence for appendicitis and there is a question  of focal pyelonephritis in the right lower pole.   IMPRESSION:  A 35 year old white female admitted with right upper quadrant  and right lower quadrant abdominal pain who has:  1. CKD with proteinuria--her proteinuria by her own admission is      longstanding, since at least 1998, but we have no data regarding      quantitation.  Serum creatinine has been elevated since at least 2003      and her current EGFR is probably less than 50.  I doubt, based on her      exam, that she has focal pyelonephritis.  She is afebrile and has no      white count and her pain is more right upper quadrant and right lower      quadrant as opposed to flank pain.  It is a little too early for HELP      syndrome and, of note, platelets have actually been somewhat low since      August 2007.  I suspect that her liver function abnormalities do not have any relationship  to her nephrologic problems.  Would  obtain a formal GI and General Surgery  opinion  regarding the possibility of retained common duct stones,  appendicitis, etc.   RECOMMENDATIONS:  From a renal standpoint, she needs either a re-read on her  renal ultrasound to look at kidney sizes and echogenicity or a dedicated  renal ultrasound for the same.  Would check her serologies including  hepatitis-B and hepatitis-C, HIV, ANA, anti-double-stranded ANA,  complements, UPEP.  Will obtain spot urine for protein to creatinine ratio  as a rough approximation of her proteinuria, then complete a 24-hour urine  for a more formal evaluation.  Will check a urine culture and stop  antibiotics, if negative.  If no diagnosis is forthcoming and she has  significant proteinuria, she will eventually need a renal biopsy but this  would need to be postponed until post pregnancy because of potential risks.  Once all of her above problems are sorted out, because of her underlying  chronic kidney disease, she will need to be followed in [facility].  Finally, her admission blood pressure in the 140s/80s is not normal for  pregnancy and attention should be given to treating her hypertension with  appropriate medicines for pregnancy.   Will follow patient with you.  Thanks for asking [place] to see her.           ______________________________  Duke Salvia. [name], M.D.     CBD/MEDQ  D:  04/17/2006  T:  04/17/2006  Job:  [id number]

## 2010-12-28 NOTE — Discharge Summary (Signed)
[name], [name]           ACCOUNT NO.:  [id number]   MEDICAL RECORD NO.:  [id number]          PATIENT TYPE:  INP   LOCATION:  6708                         FACILITY:  [facility]   PHYSICIAN:  Cecille Aver, M.D.DATE OF BIRTH:  1976-05-11   DATE OF ADMISSION:  07/28/2008  DATE OF DISCHARGE:  07/30/2008                               DISCHARGE SUMMARY   ADMITTING DIAGNOSES:  1. Hyperkalemia.  2. End-stage renal disease, on chronic hemodialysis.  3. Chest pain with chest wall edema following recent arteriogram,      July 22, 2008.  4. Hypertension uncontrolled.  5. Secondary hyperparathyroidism.  6. Medical noncompliance.   DISCHARGE DIAGNOSES:  1. Hyperkalemia corrected with dialysis.  2. End-stage renal disease, on chronic hemodialysis.  3. Chest pain and chest wall edema, felt most likely related to      arteriogram done in [place], [place], on July 22, 2008.  4. 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  5. Hypertension improved.  6. Secondary hyperparathyroidism.  7. Medical noncompliance.   BRIEF HISTORY:  A 35 year old white female with end-stage renal disease,  on chronic hemodialysis, Tuesday, Thursday, Saturday at the [facility], last dialyzing July 23, 2008, at [facility] where she underwent left brachial artery catheterization and  arteriogram with attempted crossing of the left brachial artery occluded  vein graft.  A balloon angioplasty of the brachial artery was attempted.  Upon discharge there, the patient noted some increased swelling in her  neck and both breasts.  She presents to the emergency room at [facility] complaining of increased swelling of her chest and CT shows  occlusion of the SVC.   ADMITTING LABORATORY DATA:  White count 88,400, hemoglobin 11.2,  platelets 134,000.  INR 1.0, sodium is 136, potassium is 6.7, chloride  102, CO2 of 18, glucose  79, BUN 78, creatinine 9.57, calcium 8.8.  LFTs  within normal limits.  CK-MB negative at 229 and 1.2 respectively.   HOSPITAL COURSE:  Upon admission, a CAT scan was done, which shows an  occlusion of the superior vena cava with extensive collateral formation.  She has bilateral pleural effusions.  Dr. Waverly Ferrari was  consulted who felt that her swelling in her neck and chest was most  likely related to the procedure done in [place] on July 22, 2008,  in which there was attempted cannulation of an occluded brachial artery  graft and there was perforation on the brachial artery in the upper arm,  which was controlled with balloon occlusion.  The resolving hematoma and  ecchymosis could explain some of her swelling.  We felt that this will  gradually resolve.  The perfusion in her left hand is maintained.  Over  this hospitalization, the patient remained stable.  Her swelling did not  worsen.  Her left hand was warm and with good circulation.   The patient dialyzed on the December 17 and December 19 here.  The  lowest attained weight was approximately 65.4 kg.  She did not drop her  blood pressure.  Her potassium before dialysis on the 19th was 5.3.  She  was discharged in stable condition to continue hemodialysis as an  outpatient using tight heparin indefinitely.   DISCHARGE MEDICATIONS:  1. Lunesta 2 mg at bedtime p.r.n.  2. Enalapril increase at 10 mg b.i.d.  3. Renal vitamin 1 daily.  4. __________ 800 mg 2 with meals.  5. Zocor 20 mg daily.  6. EPO 10,000 units IV each dialysis.  7. Venofer 100 mg IV weekly.   Dry weight remains 63.5 kg.      [name], P.A.    ______________________________  Cecille Aver, M.D.    RRK/MEDQ  D:  09/17/2008  T:  09/18/2008  Job:  [id number]

## 2010-12-28 NOTE — Op Note (Signed)
[name], [name] NO.:  [id number]   MEDICAL RECORD NO.:  [id number]          PATIENT TYPE:  AMB   LOCATION:  SDC                           FACILITY:  [facility]   PHYSICIAN:  Ginger Carne, MD  DATE OF BIRTH:  03/25/1976   DATE OF PROCEDURE:  11/28/2006  DATE OF DISCHARGE:                               OPERATIVE REPORT   PREOPERATIVE DIAGNOSIS:  A 9-4/7 week intrauterine pregnancy and  sterilization.   POSTOPERATIVE DIAGNOSIS:  A 9-4/7 week intrauterine pregnancy and  sterilization.   PROCEDURE:  Suction dilation and extraction for life-threatening medical  condition and laparoscopic bilateral tubal banding.   SURGEON:  Blima Rich, M.D.   ASSISTANT:  None.   COMPLICATIONS:  None immediate.   ESTIMATED BLOOD LOSS:  Minimal.   SPECIMEN:  Products of conception to pathology.   OPERATIVE FINDINGS:  The patient was 9-4/7 weeks' gestation with  products of conception noted.  Intrauterine cavity was smooth at the end  of the procedure.  Laparoscopic evaluation revealed a 9-10 weeks size  uterus consistent with pregnancy.  Both tubes and ovaries were  identifiable with decidual reaction and tubes identified from their  isthmus to fimbriated ends, separate and apart from their respective  round ligaments.   OPERATIVE PROCEDURE:  The patient prepped and draped in usual fashion  and placed in lithotomy position.  Betadine solution used for  antiseptic, and the patient was catheterized prior to the procedure.  After adequate general anesthesia, a tenaculum was placed on the  anterior lip of the cervix and a Hulka tenaculum placed in the  endocervical canal.  Afterwards, a vertical infraumbilical incision was  made and Veress needle placed in the abdomen.  Opening and closing  pressures were 10 and 15 mmHg.  Needle release trocar placed in the same  incision.  Laparoscope placed in trocar sleeve.  An 8-mm port was made  in the left lower quadrant under  direct visualization.  After inspecting  the pelvic contents, both tubes were identified from their isthmus to  fimbriated ends, separate and apart from their respective round  ligaments.  Bands were placed at the isthmus ampullary junction on  either side, well applied, no active bleeding.  Afterwards, gas  released, trocars removed.  Closure of the 10-mm fascia site with 0  Vicryl suture and 4-0 Vicryl for subcuticular closure.   Dilation to accommodate a #9 suction curette was followed by suction and  sharp curettage and then suction again.  All products removed from the  uterus.  The cavity was smooth at the end of the procedure. Minimal  bleeding noted.  The patient tolerated the procedure well, returned to  the post-anesthesia recovery room in excellent condition.      Ginger Carne, MD  Electronically Signed     SHB/MEDQ  D:  11/28/2006  T:  11/28/2006  Job:  [id number]

## 2011-05-02 LAB — URINE MICROSCOPIC-ADD ON

## 2011-05-02 LAB — URINALYSIS, ROUTINE W REFLEX MICROSCOPIC
Bilirubin Urine: NEGATIVE
Glucose, UA: NEGATIVE
Hgb urine dipstick: NEGATIVE
Ketones, ur: NEGATIVE
Leukocytes, UA: NEGATIVE
Nitrite: NEGATIVE
Protein, ur: 100 — AB
Specific Gravity, Urine: 1.015
Urobilinogen, UA: 0.2
pH: 5.5

## 2011-05-02 LAB — PREGNANCY, URINE: Preg Test, Ur: NEGATIVE

## 2011-05-06 ENCOUNTER — Emergency Department: Payer: Self-pay | Admitting: Emergency Medicine

## 2011-05-06 LAB — CBC
HCT: 33.1 — ABNORMAL LOW
Hemoglobin: 11.2 — ABNORMAL LOW
MCHC: 33.7
MCV: 87.9
Platelets: 163
RBC: 3.77 — ABNORMAL LOW
RDW: 12.9
WBC: 6.7

## 2011-05-06 LAB — BASIC METABOLIC PANEL
BUN: 40 — ABNORMAL HIGH
BUN: 47 — ABNORMAL HIGH
CO2: 17 — ABNORMAL LOW
CO2: 19
Calcium: 8.3 — ABNORMAL LOW
Calcium: 8.6
Chloride: 110
Chloride: 110
Creatinine, Ser: 5.15 — ABNORMAL HIGH
Creatinine, Ser: 5.17 — ABNORMAL HIGH
GFR calc Af Amer: 12 — ABNORMAL LOW
GFR calc Af Amer: 12 — ABNORMAL LOW
GFR calc non Af Amer: 10 — ABNORMAL LOW
GFR calc non Af Amer: 10 — ABNORMAL LOW
Glucose, Bld: 103 — ABNORMAL HIGH
Glucose, Bld: 88
Potassium: 4.5
Potassium: 5.6 — ABNORMAL HIGH
Sodium: 133 — ABNORMAL LOW
Sodium: 137

## 2011-05-06 LAB — POCT I-STAT 4, (NA,K, GLUC, HGB,HCT)
Glucose, Bld: 86
HCT: 39
Hemoglobin: 13.3
Operator id: 181601
Potassium: 5
Sodium: 137

## 2011-05-10 LAB — POTASSIUM: Potassium: 4.6

## 2011-05-10 LAB — POCT I-STAT 4, (NA,K, GLUC, HGB,HCT)
Glucose, Bld: 78
HCT: 37
Hemoglobin: 12.6
Operator id: 181601
Potassium: 4.8
Sodium: 138

## 2011-05-14 LAB — POCT I-STAT, CHEM 8
BUN: 31 — ABNORMAL HIGH
BUN: 59 — ABNORMAL HIGH
Calcium, Ion: 1.01 — ABNORMAL LOW
Calcium, Ion: 1.04 — ABNORMAL LOW
Chloride: 103
Chloride: 110
Creatinine, Ser: 10.7 — ABNORMAL HIGH
Creatinine, Ser: 5.6 — ABNORMAL HIGH
Glucose, Bld: 79
Glucose, Bld: 88
HCT: 38
HCT: 39
Hemoglobin: 12.9
Hemoglobin: 13.3
Potassium: 4.6
Potassium: 5.3 — ABNORMAL HIGH
Sodium: 133 — ABNORMAL LOW
Sodium: 136
TCO2: 18
TCO2: 20

## 2011-05-14 LAB — DIFFERENTIAL
Basophils Absolute: 0
Basophils Absolute: 0
Basophils Absolute: 0
Basophils Relative: 0
Basophils Relative: 1
Basophils Relative: 1
Eosinophils Absolute: 0
Eosinophils Absolute: 0.2
Eosinophils Absolute: 0.3
Eosinophils Relative: 0
Eosinophils Relative: 2
Eosinophils Relative: 4
Lymphocytes Relative: 18
Lymphocytes Relative: 26
Lymphocytes Relative: 27
Lymphs Abs: 0.5 — ABNORMAL LOW
Lymphs Abs: 1.7
Lymphs Abs: 2.3
Monocytes Absolute: 0.2
Monocytes Absolute: 0.3
Monocytes Absolute: 0.7
Monocytes Relative: 5
Monocytes Relative: 5
Monocytes Relative: 7
Neutro Abs: 2.2
Neutro Abs: 3.9
Neutro Abs: 5.9
Neutrophils Relative %: 63
Neutrophils Relative %: 65
Neutrophils Relative %: 76

## 2011-05-14 LAB — CBC
HCT: 35.3 — ABNORMAL LOW
HCT: 37
HCT: 39.7
Hemoglobin: 11.7 — ABNORMAL LOW
Hemoglobin: 12.2
Hemoglobin: 12.7
MCHC: 32.1
MCHC: 33
MCHC: 33.2
MCV: 86.9
MCV: 87.8
MCV: 91.1
Platelets: 105 — ABNORMAL LOW
Platelets: 164
Platelets: 262
RBC: 4.07
RBC: 4.21
RBC: 4.36
RDW: 14.6
RDW: 16.1 — ABNORMAL HIGH
RDW: 17.2 — ABNORMAL HIGH
WBC: 2.9 — ABNORMAL LOW
WBC: 6.2
WBC: 9

## 2011-05-14 LAB — URINALYSIS, ROUTINE W REFLEX MICROSCOPIC
Bilirubin Urine: NEGATIVE
Glucose, UA: NEGATIVE
Hgb urine dipstick: NEGATIVE
Ketones, ur: NEGATIVE
Nitrite: NEGATIVE
Protein, ur: >300 — AB
Specific Gravity, Urine: 1.017
Urobilinogen, UA: 0.2
pH: 8

## 2011-05-14 LAB — URINE CULTURE
Colony Count: NO GROWTH
Culture: NO GROWTH

## 2011-05-14 LAB — CULTURE, BLOOD (ROUTINE X 2)
Culture: NO GROWTH
Culture: NO GROWTH
Culture: NO GROWTH
Culture: NO GROWTH

## 2011-05-14 LAB — BASIC METABOLIC PANEL
BUN: 32 — ABNORMAL HIGH
BUN: 43 — ABNORMAL HIGH
CO2: 19
CO2: 26
Calcium: 8.5
Calcium: 8.9
Chloride: 101
Chloride: 102
Creatinine, Ser: 6.58 — ABNORMAL HIGH
Creatinine, Ser: 8.71 — ABNORMAL HIGH
GFR calc Af Amer: 6 — ABNORMAL LOW
GFR calc Af Amer: 9 — ABNORMAL LOW
GFR calc non Af Amer: 5 — ABNORMAL LOW
GFR calc non Af Amer: 7 — ABNORMAL LOW
Glucose, Bld: 101 — ABNORMAL HIGH
Glucose, Bld: 83
Potassium: 4.6
Potassium: 5
Sodium: 134 — ABNORMAL LOW
Sodium: 140

## 2011-05-14 LAB — URINE MICROSCOPIC-ADD ON

## 2011-05-14 LAB — POCT I-STAT 4, (NA,K, GLUC, HGB,HCT)
Glucose, Bld: 69 — ABNORMAL LOW
HCT: 33 — ABNORMAL LOW
Hemoglobin: 11.2 — ABNORMAL LOW
Potassium: 4.2
Sodium: 134 — ABNORMAL LOW

## 2011-05-14 LAB — POCT CARDIAC MARKERS
CKMB, poc: <1 — ABNORMAL LOW
Myoglobin, poc: 264
Troponin i, poc: <0.05

## 2011-05-17 LAB — COMPREHENSIVE METABOLIC PANEL
ALT: 11 U/L (ref 0–35)
AST: 11 U/L (ref 0–37)
Albumin: 3 g/dL — ABNORMAL LOW (ref 3.5–5.2)
Alkaline Phosphatase: 125 U/L — ABNORMAL HIGH (ref 39–117)
BUN: 78 mg/dL — ABNORMAL HIGH (ref 6–23)
CO2: 18 mEq/L — ABNORMAL LOW (ref 19–32)
Calcium: 8.8 mg/dL (ref 8.4–10.5)
Chloride: 102 mEq/L (ref 96–112)
Creatinine, Ser: 9.57 mg/dL — ABNORMAL HIGH (ref 0.4–1.2)
GFR calc Af Amer: 6 mL/min — ABNORMAL LOW (ref >60)
GFR calc non Af Amer: 5 mL/min — ABNORMAL LOW (ref >60)
Glucose, Bld: 79 mg/dL (ref 70–99)
Potassium: 6.7 mEq/L (ref 3.5–5.1)
Sodium: 136 mEq/L (ref 135–145)
Total Bilirubin: 0.4 mg/dL (ref 0.3–1.2)
Total Protein: 6.1 g/dL (ref 6.0–8.3)

## 2011-05-17 LAB — RENAL FUNCTION PANEL
Albumin: 2.7 g/dL — ABNORMAL LOW (ref 3.5–5.2)
Albumin: 2.8 g/dL — ABNORMAL LOW (ref 3.5–5.2)
BUN: 37 mg/dL — ABNORMAL HIGH (ref 6–23)
BUN: 56 mg/dL — ABNORMAL HIGH (ref 6–23)
CO2: 22 mEq/L (ref 19–32)
CO2: 22 mEq/L (ref 19–32)
Calcium: 8.6 mg/dL (ref 8.4–10.5)
Calcium: 8.6 mg/dL (ref 8.4–10.5)
Chloride: 101 mEq/L (ref 96–112)
Chloride: 105 mEq/L (ref 96–112)
Creatinine, Ser: 6.23 mg/dL — ABNORMAL HIGH (ref 0.4–1.2)
Creatinine, Ser: 8.17 mg/dL — ABNORMAL HIGH (ref 0.4–1.2)
GFR calc Af Amer: 10 mL/min — ABNORMAL LOW (ref >60)
GFR calc Af Amer: 7 mL/min — ABNORMAL LOW (ref >60)
GFR calc non Af Amer: 6 mL/min — ABNORMAL LOW (ref >60)
GFR calc non Af Amer: 8 mL/min — ABNORMAL LOW (ref >60)
Glucose, Bld: 103 mg/dL — ABNORMAL HIGH (ref 70–99)
Glucose, Bld: 77 mg/dL (ref 70–99)
Phosphorus: 6.5 mg/dL — ABNORMAL HIGH (ref 2.3–4.6)
Phosphorus: 7.1 mg/dL — ABNORMAL HIGH (ref 2.3–4.6)
Potassium: 5.3 mEq/L — ABNORMAL HIGH (ref 3.5–5.1)
Potassium: 6.3 mEq/L (ref 3.5–5.1)
Sodium: 137 mEq/L (ref 135–145)
Sodium: 139 mEq/L (ref 135–145)

## 2011-05-17 LAB — CBC
HCT: 32.4 % — ABNORMAL LOW (ref 36.0–46.0)
HCT: 34.3 % — ABNORMAL LOW (ref 36.0–46.0)
Hemoglobin: 11.2 g/dL — ABNORMAL LOW (ref 12.0–15.0)
Hemoglobin: 11.2 g/dL — ABNORMAL LOW (ref 12.0–15.0)
MCHC: 32.6 g/dL (ref 30.0–36.0)
MCHC: 34.5 g/dL (ref 30.0–36.0)
MCV: 87.7 fL (ref 78.0–100.0)
MCV: 89.2 fL (ref 78.0–100.0)
Platelets: 127 10*3/uL — ABNORMAL LOW (ref 150–400)
Platelets: 134 10*3/uL — ABNORMAL LOW (ref 150–400)
RBC: 3.7 MIL/uL — ABNORMAL LOW (ref 3.87–5.11)
RBC: 3.85 MIL/uL — ABNORMAL LOW (ref 3.87–5.11)
RDW: 16.3 % — ABNORMAL HIGH (ref 11.5–15.5)
RDW: 16.5 % — ABNORMAL HIGH (ref 11.5–15.5)
WBC: 6.9 10*3/uL (ref 4.0–10.5)
WBC: 8.4 10*3/uL (ref 4.0–10.5)

## 2011-05-17 LAB — POCT I-STAT 4, (NA,K, GLUC, HGB,HCT)
Glucose, Bld: 76
HCT: 42
Hemoglobin: 14.3
Operator id: 181601
Potassium: 4.9
Sodium: 137

## 2011-05-17 LAB — POTASSIUM: Potassium: 5.1 mEq/L (ref 3.5–5.1)

## 2011-05-17 LAB — DIFFERENTIAL
Basophils Absolute: 0 10*3/uL (ref 0.0–0.1)
Basophils Relative: 0 % (ref 0–1)
Eosinophils Absolute: 0.3 10*3/uL (ref 0.0–0.7)
Eosinophils Relative: 4 % (ref 0–5)
Lymphocytes Relative: 23 % (ref 12–46)
Lymphs Abs: 1.9 10*3/uL (ref 0.7–4.0)
Monocytes Absolute: 0.3 10*3/uL (ref 0.1–1.0)
Monocytes Relative: 4 % (ref 3–12)
Neutro Abs: 5.8 10*3/uL (ref 1.7–7.7)
Neutrophils Relative %: 69 % (ref 43–77)

## 2011-05-17 LAB — POCT CARDIAC MARKERS
CKMB, poc: 1.2 ng/mL (ref 1.0–8.0)
Myoglobin, poc: 229 ng/mL (ref 12–200)
Troponin i, poc: <0.05 ng/mL (ref 0.00–0.09)

## 2011-05-17 LAB — HEPATITIS B SURFACE ANTIGEN: Hepatitis B Surface Ag: NEGATIVE

## 2011-05-17 LAB — APTT: aPTT: 81 seconds — ABNORMAL HIGH (ref 24–37)

## 2011-05-17 LAB — HEMOGLOBIN: Hemoglobin: 10.2 g/dL — ABNORMAL LOW (ref 12.0–15.0)

## 2011-05-17 LAB — HEPARIN LEVEL (UNFRACTIONATED): Heparin Unfractionated: 0.66 IU/mL (ref 0.30–0.70)

## 2011-05-17 LAB — PROTIME-INR
INR: 1 (ref 0.00–1.49)
INR: 1 (ref 0.00–1.49)
Prothrombin Time: 13.4 seconds (ref 11.6–15.2)
Prothrombin Time: 13.7 seconds (ref 11.6–15.2)

## 2011-05-17 LAB — B-NATRIURETIC PEPTIDE (CONVERTED LAB): Pro B Natriuretic peptide (BNP): 312 pg/mL — ABNORMAL HIGH (ref 0.0–100.0)

## 2011-05-21 LAB — POCT I-STAT 4, (NA,K, GLUC, HGB,HCT)
Glucose, Bld: 75
HCT: 42
Hemoglobin: 14.3
Operator id: 274481
Potassium: 4.7
Sodium: 137

## 2011-05-24 LAB — URINALYSIS, ROUTINE W REFLEX MICROSCOPIC
Bilirubin Urine: NEGATIVE
Glucose, UA: NEGATIVE
Hgb urine dipstick: NEGATIVE
Ketones, ur: NEGATIVE
Leukocytes, UA: NEGATIVE
Nitrite: NEGATIVE
Protein, ur: >300 — AB
Specific Gravity, Urine: 1.012
Urobilinogen, UA: 0.2
pH: 5.5

## 2011-05-24 LAB — URINE MICROSCOPIC-ADD ON

## 2011-05-24 LAB — PREGNANCY, URINE: Preg Test, Ur: NEGATIVE

## 2011-05-27 LAB — BASIC METABOLIC PANEL
BUN: 54 — ABNORMAL HIGH
CO2: 18 — ABNORMAL LOW
Calcium: 8.3 — ABNORMAL LOW
Chloride: 111
Creatinine, Ser: 3.98 — ABNORMAL HIGH
GFR calc Af Amer: 16 — ABNORMAL LOW
GFR calc non Af Amer: 13 — ABNORMAL LOW
Glucose, Bld: 82
Potassium: 5.1
Sodium: 135

## 2011-05-27 LAB — POCT I-STAT 4, (NA,K, GLUC, HGB,HCT)
Glucose, Bld: 62 — ABNORMAL LOW
HCT: 43
Hemoglobin: 14.6
Operator id: 181601
Potassium: 4.6
Sodium: 140

## 2011-05-27 LAB — DIFFERENTIAL
Basophils Absolute: 0
Basophils Relative: 1
Eosinophils Absolute: 0.3
Eosinophils Relative: 4
Lymphocytes Relative: 23
Lymphs Abs: 1.7
Monocytes Absolute: 0.4
Monocytes Relative: 5
Neutro Abs: 5.1
Neutrophils Relative %: 68

## 2011-05-27 LAB — CBC
HCT: 42.4
Hemoglobin: 14.3
MCHC: 33.7
MCV: 87.6
Platelets: 130 — ABNORMAL LOW
RBC: 4.84
RDW: 13
WBC: 7.5

## 2011-11-07 ENCOUNTER — Emergency Department: Payer: Self-pay | Admitting: Emergency Medicine

## 2012-10-20 ENCOUNTER — Other Ambulatory Visit (HOSPITAL_COMMUNITY): Payer: Self-pay | Admitting: Sports Medicine

## 2012-10-20 DIAGNOSIS — M222X2 Patellofemoral disorders, left knee: Secondary | ICD-10-CM

## 2012-10-26 ENCOUNTER — Ambulatory Visit (HOSPITAL_COMMUNITY)
Admission: RE | Admit: 2012-10-26 | Discharge: 2012-10-26 | Disposition: A | Discharge status: Home / Self Care | Payer: Medicaid Other | Source: Ambulatory Visit | Attending: Sports Medicine | Admitting: Sports Medicine

## 2012-10-26 ENCOUNTER — Encounter (HOSPITAL_COMMUNITY): Payer: Self-pay

## 2012-10-26 DIAGNOSIS — S99929A Unspecified injury of unspecified foot, initial encounter: Secondary | ICD-10-CM | POA: Insufficient documentation

## 2012-10-26 DIAGNOSIS — M222X2 Patellofemoral disorders, left knee: Secondary | ICD-10-CM

## 2012-10-26 DIAGNOSIS — M25569 Pain in unspecified knee: Secondary | ICD-10-CM | POA: Insufficient documentation

## 2012-10-26 DIAGNOSIS — S8990XA Unspecified injury of unspecified lower leg, initial encounter: Secondary | ICD-10-CM | POA: Insufficient documentation

## 2012-10-26 DIAGNOSIS — R937 Abnormal findings on diagnostic imaging of other parts of musculoskeletal system: Secondary | ICD-10-CM | POA: Insufficient documentation

## 2012-10-26 DIAGNOSIS — M25469 Effusion, unspecified knee: Secondary | ICD-10-CM | POA: Insufficient documentation

## 2013-09-14 ENCOUNTER — Ambulatory Visit: Payer: Self-pay

## 2014-03-04 ENCOUNTER — Other Ambulatory Visit: Payer: Self-pay | Admitting: Nephrology

## 2014-03-04 DIAGNOSIS — R7989 Other specified abnormal findings of blood chemistry: Secondary | ICD-10-CM

## 2014-03-04 DIAGNOSIS — R109 Unspecified abdominal pain: Secondary | ICD-10-CM

## 2014-03-07 ENCOUNTER — Inpatient Hospital Stay: Admission: RE | Admit: 2014-03-07 | Payer: Medicare Other | Source: Ambulatory Visit

## 2014-03-08 ENCOUNTER — Other Ambulatory Visit: Payer: Medicare Other

## 2014-03-08 ENCOUNTER — Ambulatory Visit
Admission: RE | Admit: 2014-03-08 | Discharge: 2014-03-08 | Disposition: A | Discharge status: Home / Self Care | Payer: Medicare Other | Source: Ambulatory Visit | Attending: Nephrology | Admitting: Nephrology

## 2014-03-08 DIAGNOSIS — R109 Unspecified abdominal pain: Secondary | ICD-10-CM

## 2014-03-08 DIAGNOSIS — R7989 Other specified abnormal findings of blood chemistry: Secondary | ICD-10-CM

## 2014-03-09 ENCOUNTER — Other Ambulatory Visit: Payer: Medicare Other

## 2014-03-16 ENCOUNTER — Encounter: Payer: Self-pay | Admitting: Vascular Surgery

## 2014-03-17 ENCOUNTER — Encounter: Payer: Self-pay | Admitting: Vascular Surgery

## 2014-03-17 ENCOUNTER — Ambulatory Visit (INDEPENDENT_AMBULATORY_CARE_PROVIDER_SITE_OTHER): Payer: Medicare Other | Admitting: Vascular Surgery

## 2014-03-17 VITALS — BP 154/100 | HR 51 | Ht 61.0 in | Wt 179.0 lb

## 2014-03-17 DIAGNOSIS — I871 Compression of vein: Secondary | ICD-10-CM

## 2014-03-17 NOTE — Progress Notes (Signed)
[facility] HISTORY AND PHYSICAL  Referring physician: Camille Bal M.D. History of Present Illness:  Patient is a 38 y.o. year old female who presents for evaluation of symptoms of superior vena cava syndrome. The patient complains of increased swelling of her face recently. She also states that she sometimes gets swelling in her upper extremities. She states that her upper extremities are weaker than it used to be. She states that the facial swelling can become quite significant if she has an episode of vomiting. She states her face was well to the point that her eyes are closed shut. She does not really describe any significant swelling in her lower extremities. She also complains that she has gained 20 pounds of weight recently. The patient was a chronic hemodialysis patient. She had multiple hemodialysis access procedures from 2008 fetal 2010. She received a kidney transplant from her mother and 2010. Review of her records shows that she had evidence of superior vena cava occlusion in 2010. At that point she also had multiple collaterals through the azygous system with multiple collaterals along the abdominal wall reconstituting through the inferior vena cava. She also complains of intermittent numbness and tingling of her left middle finger. She also had a brachial artery bypass performed Dr. Madilyn Fireman in 2009. This is also chronically occluded since 2010. Other medical problems include hypertension and hyperlipidemia both of which are currently controlled. She did have elevation of her liver function tests in the past but this later normalized.   Past medical history: Hypertension, secondary hyperparathyroidism, anxiety, hyperlipidemia, reflux,  Past Surgical History  Procedure Laterality Date  . Appendectomy    . Cholecystectomy    . Kidney transplant Right   . Tubal ligation    . Cesarean section    . Vein ligation    . Vein ligation      Social  History History  Substance Use Topics  . Smoking status: Current Every Day Smoker -- 0.75 packs/day    Types: Cigarettes  . Smokeless tobacco: Never Used  . Alcohol Use: No    Family History No family history on file.  Allergies  Allergies  Allergen Reactions  . Vancomycin     REACTION: gives patient "red man syndrome"     Current Outpatient Prescriptions  Medication Sig Dispense Refill  . ALPRAZolam (XANAX) 0.5 MG tablet Take 0.5 mg by mouth 2 (two) times daily.        Marland Kitchen amLODipine (NORVASC) 5 MG tablet Take 5 mg by mouth daily.        . cloNIDine (CATAPRES) 0.2 MG tablet Take 0.2 mg by mouth 2 (two) times daily.      . enalapril (VASOTEC) 10 MG tablet Take 10 mg by mouth 2 (two) times daily.      . mycophenolate (MYFORTIC) 360 MG TBEC Take 720 mg by mouth 2 (two) times daily.        . OxyCODONE (OXYCONTIN) 10 mg T12A 12 hr tablet Take 10 mg by mouth every 12 (twelve) hours.      Marland Kitchen oxycodone (ROXICODONE) 30 MG immediate release tablet Take 30 mg by mouth every 12 (twelve) hours as needed for pain.      . predniSONE (DELTASONE) 10 MG tablet Take 20 mg by mouth daily.        . simvastatin (ZOCOR) 40 MG tablet Take 40 mg by mouth every other day.      . tacrolimus (PROGRAF) 1 MG capsule Take 3 mg by mouth  2 (two) times daily.        Marland Kitchen. zolpidem (AMBIEN) 10 MG tablet Take 10 mg by mouth at bedtime as needed.        Marland Kitchen. HYDROcodone-acetaminophen (LORTAB) 7.5-500 MG per tablet Take 1 tablet by mouth daily as needed.        . sulfamethoxazole-trimethoprim (BACTRIM DS) 800-160 MG per tablet Take 1 tablet by mouth 2 (two) times daily.         No current facility-administered medications for this visit.    ROS:   General:  20 pound weight gain over the last year, no Fever, no chills  HEENT: + recent headaches, no nasal bleeding, no visual changes, no sore throat  Neurologic: No dizziness, blackouts, seizures. No recent symptoms of stroke or mini- stroke. No recent episodes of  slurred speech, or temporary blindness.  Cardiac: No recent episodes of chest pain/pressure, no shortness of breath at rest.  No shortness of breath with exertion.  Denies history of atrial fibrillation or irregular heartbeat  Vascular: No history of rest pain in feet.  No history of claudication.  No history of non-healing ulcer, No history of DVT   Pulmonary: No home oxygen, no productive cough, no hemoptysis,  + asthma or wheezing  Musculoskeletal:  [ ]  Arthritis, [ ]  Low back pain,  [ ]  Joint pain  Hematologic:No history of hypercoagulable state.  No history of easy bleeding.  No history of anemia  Gastrointestinal: No hematochezia or melena,  + gastroesophageal reflux, no trouble swallowing  Urinary: [ ]  chronic Kidney disease, [ ]  on HD - [ ]  MWF or [ ]  TTHS, [ ]  Burning with urination, [ ]  Frequent urination, [ ]  Difficulty urinating;   Skin: No rashes  Psychological: + history of anxiety,  + history of depression   Physical Examination  Filed Vitals:   03/17/14 1611  BP: 154/100  Pulse: 51  Height: 5\' 1"  (1.549 m)  Weight: 179 lb (81.194 kg)  SpO2: 100%    Body mass index is 33.84 kg/(m^2).  General:  Alert and oriented, no acute distress HEENT: Normal Neck: No bruit or JVD, no obvious distended neck veins Pulmonary: Clear to auscultation bilaterally, no obvious chest wall collaterals Cardiac: Regular Rate and Rhythm without murmur Abdomen: Soft, non-tender, non-distended, no mass, large abdominal wall venous collaterals on the left and right abdominal lateral wall approximately 4-6 mm diameter tortuous Skin: No rash Extremity Pulses:  2+ femoral pulses bilaterally, trace edema lower extremities no obvious pitting edema upper extremities no obvious collateral venous structures in the upper extremities Musculoskeletal: No deformity, trace edema as mentioned above  Neurologic: Upper and lower extremity motor 5/5 and symmetric  DATA:  CT scan from 2010 as well as  multiple central venogram is from prior catheter placements reviewed.  The patient had subtotal occlusion of her superior vena cava and 2010. There was a catheter in the superior vena cava at that time. He only lumen through superior vena cava at that time was where the catheter was placed.   ASSESSMENT:  Acute worsening of symptoms with chronic superior vena cava occlusion. Patient has developed chest and abdominal wall collaterals to compensate for her superior vena cava occlusion.  I had a lengthy discussion with the patient today regarding possible treatment options of this. I would consider her symptoms currently mild to moderate. I did discuss with her that this is not a life-threatening condition but certainly can have nuisance symptoms. Repair of this would be a substantial  operation with significant morbidity. If she wished to undergo repair of this for she would need bilateral central venogram is to assess patency of her subclavian jugular and innominate system. She would also need an ascending central venogram from a femoral approach. Based on the anatomy of these studies we to consider whether or not to do a jugular vein to right atrial bypass using a spiral vein constructed from her saphenous vein or superficial femoral vein. This operation would require neck incision as well as a median sternotomy.  I would not consider recanalization and stenting of her superior vena cava as it did not believe this would give a durable result.  The patient was reassured somewhat that this was not a life-threatening condition. However, she is obviously disappointed that there is no easy fix for this. I did discuss with her that this could potentially alleviate symptoms of swelling in the upper extremities and face. However, I am not sure that this would improve her upper extremity weakness symptoms since some of this is probably secondary either neuropathy or arterial occlusive disease from her previous access  procedures. Occlusion of her superior vena cava would not account for her 20 pound weight gain.  At this point she wishes to think about things about whether or not she would wish to undergo this major procedure for her current symptoms. She will call me back if she wishes to proceed.  Fabienne Bruns, MD [facility] Office: [phone] Pager: [phone]

## 2014-07-06 ENCOUNTER — Ambulatory Visit
Admission: RE | Admit: 2014-07-06 | Discharge: 2014-07-06 | Disposition: A | Discharge status: Home / Self Care | Payer: Medicare Other | Source: Ambulatory Visit | Attending: Nephrology | Admitting: Nephrology

## 2014-07-06 ENCOUNTER — Other Ambulatory Visit: Payer: Self-pay | Admitting: Nephrology

## 2014-07-06 DIAGNOSIS — D72829 Elevated white blood cell count, unspecified: Secondary | ICD-10-CM

## 2015-05-08 ENCOUNTER — Encounter: Payer: Self-pay | Admitting: Gastroenterology

## 2015-05-23 ENCOUNTER — Encounter (HOSPITAL_BASED_OUTPATIENT_CLINIC_OR_DEPARTMENT_OTHER): Payer: Medicare Other | Attending: General Surgery

## 2015-06-28 ENCOUNTER — Ambulatory Visit: Payer: Medicare Other | Admitting: Gastroenterology

## 2016-07-08 ENCOUNTER — Other Ambulatory Visit: Payer: Self-pay | Admitting: Nephrology

## 2016-07-08 DIAGNOSIS — R7989 Other specified abnormal findings of blood chemistry: Secondary | ICD-10-CM

## 2016-07-08 DIAGNOSIS — R945 Abnormal results of liver function studies: Principal | ICD-10-CM

## 2016-08-02 ENCOUNTER — Inpatient Hospital Stay: Admission: RE | Admit: 2016-08-02 | Payer: Medicare Other | Source: Ambulatory Visit

## 2016-08-06 ENCOUNTER — Other Ambulatory Visit: Payer: Medicare Other

## 2016-08-08 ENCOUNTER — Inpatient Hospital Stay: Admission: RE | Admit: 2016-08-08 | Payer: Medicare Other | Source: Ambulatory Visit

## 2016-12-17 ENCOUNTER — Other Ambulatory Visit (HOSPITAL_COMMUNITY): Payer: Self-pay | Admitting: *Deleted

## 2016-12-18 ENCOUNTER — Ambulatory Visit (HOSPITAL_COMMUNITY)
Admission: RE | Admit: 2016-12-18 | Discharge: 2016-12-18 | Disposition: A | Discharge status: Home / Self Care | Payer: Medicare Other | Source: Ambulatory Visit | Attending: Nephrology | Admitting: Nephrology

## 2016-12-18 DIAGNOSIS — D631 Anemia in chronic kidney disease: Secondary | ICD-10-CM | POA: Insufficient documentation

## 2016-12-18 MED ORDER — SODIUM CHLORIDE 0.9 % IV SOLN
510.0000 mg | Freq: Once | INTRAVENOUS | Status: AC
  Administered 2016-12-18: 510 mg via INTRAVENOUS
  Filled 2016-12-18: qty 17

## 2016-12-18 NOTE — Discharge Instructions (Signed)

## 2018-11-26 ENCOUNTER — Other Ambulatory Visit (HOSPITAL_COMMUNITY): Payer: Self-pay | Admitting: Nephrology

## 2018-11-26 DIAGNOSIS — M79662 Pain in left lower leg: Secondary | ICD-10-CM

## 2018-11-26 DIAGNOSIS — M7989 Other specified soft tissue disorders: Principal | ICD-10-CM

## 2018-11-27 ENCOUNTER — Ambulatory Visit (HOSPITAL_COMMUNITY)
Admission: RE | Admit: 2018-11-27 | Discharge: 2018-11-27 | Disposition: A | Discharge status: Home / Self Care | Payer: Medicare Other | Source: Ambulatory Visit | Attending: Cardiovascular Disease | Admitting: Cardiovascular Disease

## 2018-11-27 ENCOUNTER — Other Ambulatory Visit: Payer: Self-pay

## 2018-11-27 DIAGNOSIS — M79662 Pain in left lower leg: Secondary | ICD-10-CM | POA: Diagnosis not present

## 2018-11-27 DIAGNOSIS — M7989 Other specified soft tissue disorders: Secondary | ICD-10-CM | POA: Insufficient documentation

## 2020-05-08 ENCOUNTER — Emergency Department (HOSPITAL_COMMUNITY): Payer: Medicare Other

## 2020-05-08 ENCOUNTER — Inpatient Hospital Stay (HOSPITAL_COMMUNITY)
Admission: EM | Admit: 2020-05-08 | Discharge: 2020-05-12 | DRG: 439 | Disposition: A | Discharge status: Home / Self Care | Payer: Medicare Other | Attending: Internal Medicine | Admitting: Internal Medicine

## 2020-05-08 ENCOUNTER — Encounter (HOSPITAL_COMMUNITY): Payer: Self-pay

## 2020-05-08 ENCOUNTER — Other Ambulatory Visit: Payer: Self-pay

## 2020-05-08 DIAGNOSIS — F1120 Opioid dependence, uncomplicated: Secondary | ICD-10-CM | POA: Diagnosis present

## 2020-05-08 DIAGNOSIS — N949 Unspecified condition associated with female genital organs and menstrual cycle: Secondary | ICD-10-CM

## 2020-05-08 DIAGNOSIS — I129 Hypertensive chronic kidney disease with stage 1 through stage 4 chronic kidney disease, or unspecified chronic kidney disease: Secondary | ICD-10-CM | POA: Diagnosis present

## 2020-05-08 DIAGNOSIS — T380X5A Adverse effect of glucocorticoids and synthetic analogues, initial encounter: Secondary | ICD-10-CM | POA: Diagnosis present

## 2020-05-08 DIAGNOSIS — Z881 Allergy status to other antibiotic agents status: Secondary | ICD-10-CM

## 2020-05-08 DIAGNOSIS — Y92009 Unspecified place in unspecified non-institutional (private) residence as the place of occurrence of the external cause: Secondary | ICD-10-CM

## 2020-05-08 DIAGNOSIS — Z888 Allergy status to other drugs, medicaments and biological substances status: Secondary | ICD-10-CM

## 2020-05-08 DIAGNOSIS — K853 Drug induced acute pancreatitis without necrosis or infection: Secondary | ICD-10-CM | POA: Diagnosis not present

## 2020-05-08 DIAGNOSIS — I10 Essential (primary) hypertension: Secondary | ICD-10-CM | POA: Diagnosis present

## 2020-05-08 DIAGNOSIS — Z20822 Contact with and (suspected) exposure to covid-19: Secondary | ICD-10-CM | POA: Diagnosis present

## 2020-05-08 DIAGNOSIS — G894 Chronic pain syndrome: Secondary | ICD-10-CM | POA: Diagnosis present

## 2020-05-08 DIAGNOSIS — D84821 Immunodeficiency due to drugs: Secondary | ICD-10-CM | POA: Diagnosis present

## 2020-05-08 DIAGNOSIS — R109 Unspecified abdominal pain: Secondary | ICD-10-CM | POA: Diagnosis present

## 2020-05-08 DIAGNOSIS — Z79899 Other long term (current) drug therapy: Secondary | ICD-10-CM

## 2020-05-08 DIAGNOSIS — Z7952 Long term (current) use of systemic steroids: Secondary | ICD-10-CM

## 2020-05-08 DIAGNOSIS — R197 Diarrhea, unspecified: Secondary | ICD-10-CM | POA: Diagnosis present

## 2020-05-08 DIAGNOSIS — K859 Acute pancreatitis without necrosis or infection, unspecified: Secondary | ICD-10-CM | POA: Diagnosis not present

## 2020-05-08 DIAGNOSIS — F17210 Nicotine dependence, cigarettes, uncomplicated: Secondary | ICD-10-CM | POA: Diagnosis present

## 2020-05-08 DIAGNOSIS — Z940 Kidney transplant status: Secondary | ICD-10-CM

## 2020-05-08 DIAGNOSIS — Z9049 Acquired absence of other specified parts of digestive tract: Secondary | ICD-10-CM

## 2020-05-08 DIAGNOSIS — N83201 Unspecified ovarian cyst, right side: Secondary | ICD-10-CM | POA: Diagnosis present

## 2020-05-08 DIAGNOSIS — Z885 Allergy status to narcotic agent status: Secondary | ICD-10-CM

## 2020-05-08 DIAGNOSIS — T474X5A Adverse effect of other laxatives, initial encounter: Secondary | ICD-10-CM | POA: Diagnosis present

## 2020-05-08 DIAGNOSIS — N83202 Unspecified ovarian cyst, left side: Secondary | ICD-10-CM | POA: Diagnosis present

## 2020-05-08 DIAGNOSIS — N1832 Chronic kidney disease, stage 3b: Secondary | ICD-10-CM | POA: Diagnosis present

## 2020-05-08 LAB — CBC WITH DIFFERENTIAL/PLATELET
Abs Immature Granulocytes: 0.09 10*3/uL — ABNORMAL HIGH (ref 0.00–0.07)
Basophils Absolute: 0 10*3/uL (ref 0.0–0.1)
Basophils Relative: 0 %
Eosinophils Absolute: 0.1 10*3/uL (ref 0.0–0.5)
Eosinophils Relative: 1 %
HCT: 47.5 % — ABNORMAL HIGH (ref 36.0–46.0)
Hemoglobin: 16.2 g/dL — ABNORMAL HIGH (ref 12.0–15.0)
Immature Granulocytes: 1 %
Lymphocytes Relative: 5 %
Lymphs Abs: 0.9 10*3/uL (ref 0.7–4.0)
MCH: 28.1 pg (ref 26.0–34.0)
MCHC: 34.1 g/dL (ref 30.0–36.0)
MCV: 82.5 fL (ref 80.0–100.0)
Monocytes Absolute: 1 10*3/uL (ref 0.1–1.0)
Monocytes Relative: 6 %
Neutro Abs: 15.2 10*3/uL — ABNORMAL HIGH (ref 1.7–7.7)
Neutrophils Relative %: 87 %
Platelets: 186 10*3/uL (ref 150–400)
RBC: 5.76 MIL/uL — ABNORMAL HIGH (ref 3.87–5.11)
RDW: 13.5 % (ref 11.5–15.5)
WBC: 17.4 10*3/uL — ABNORMAL HIGH (ref 4.0–10.5)
nRBC: 0 % (ref 0.0–0.2)

## 2020-05-08 LAB — COMPREHENSIVE METABOLIC PANEL
ALT: 18 U/L (ref 0–44)
AST: 13 U/L — ABNORMAL LOW (ref 15–41)
Albumin: 3.3 g/dL — ABNORMAL LOW (ref 3.5–5.0)
Alkaline Phosphatase: 100 U/L (ref 38–126)
Anion gap: 13 (ref 5–15)
BUN: 22 mg/dL — ABNORMAL HIGH (ref 6–20)
CO2: 18 mmol/L — ABNORMAL LOW (ref 22–32)
Calcium: 8.7 mg/dL — ABNORMAL LOW (ref 8.9–10.3)
Chloride: 107 mmol/L (ref 98–111)
Creatinine, Ser: 1.59 mg/dL — ABNORMAL HIGH (ref 0.44–1.00)
GFR calc Af Amer: 46 mL/min — ABNORMAL LOW (ref >60)
GFR calc non Af Amer: 40 mL/min — ABNORMAL LOW (ref >60)
Glucose, Bld: 87 mg/dL (ref 70–99)
Potassium: 3.4 mmol/L — ABNORMAL LOW (ref 3.5–5.1)
Sodium: 138 mmol/L (ref 135–145)
Total Bilirubin: 0.4 mg/dL (ref 0.3–1.2)
Total Protein: 7.1 g/dL (ref 6.5–8.1)

## 2020-05-08 LAB — HCG, QUANTITATIVE, PREGNANCY: hCG, Beta Chain, Quant, S: <1 m[IU]/mL (ref <5)

## 2020-05-08 LAB — LIPASE, BLOOD: Lipase: 146 U/L — ABNORMAL HIGH (ref 11–51)

## 2020-05-08 MED ORDER — CLOPIDOGREL BISULFATE 75 MG PO TABS
75.0000 mg | ORAL_TABLET | Freq: Every day | ORAL | Status: DC
  Administered 2020-05-09 – 2020-05-12 (×4): 75 mg via ORAL
  Filled 2020-05-08 (×5): qty 1

## 2020-05-08 MED ORDER — ENOXAPARIN SODIUM 40 MG/0.4ML ~~LOC~~ SOLN
40.0000 mg | SUBCUTANEOUS | Status: DC
  Administered 2020-05-09 – 2020-05-10 (×2): 40 mg via SUBCUTANEOUS
  Filled 2020-05-08 (×4): qty 0.4

## 2020-05-08 MED ORDER — TRAMADOL HCL 50 MG PO TABS
50.0000 mg | ORAL_TABLET | Freq: Three times a day (TID) | ORAL | Status: DC | PRN
  Administered 2020-05-09 – 2020-05-11 (×5): 50 mg via ORAL
  Filled 2020-05-08 (×5): qty 1

## 2020-05-08 MED ORDER — HYDROMORPHONE HCL 1 MG/ML IJ SOLN
1.0000 mg | Freq: Once | INTRAMUSCULAR | Status: AC
Start: 2020-05-08 — End: 2020-05-08
  Administered 2020-05-08: 1 mg via INTRAVENOUS
  Filled 2020-05-08: qty 1

## 2020-05-08 MED ORDER — HYDROMORPHONE HCL 1 MG/ML IJ SOLN
1.0000 mg | INTRAMUSCULAR | Status: DC | PRN
  Administered 2020-05-08 – 2020-05-12 (×20): 1 mg via INTRAVENOUS
  Filled 2020-05-08 (×21): qty 1

## 2020-05-08 MED ORDER — TACROLIMUS 1 MG PO CAPS
3.0000 mg | ORAL_CAPSULE | Freq: Two times a day (BID) | ORAL | Status: DC
  Administered 2020-05-08 – 2020-05-12 (×8): 3 mg via ORAL
  Filled 2020-05-08 (×10): qty 3

## 2020-05-08 MED ORDER — SODIUM CHLORIDE 0.9 % IV SOLN: 1000.0000 mL | INTRAVENOUS | Status: DC

## 2020-05-08 MED ORDER — FAMOTIDINE 20 MG PO TABS
40.0000 mg | ORAL_TABLET | Freq: Every day | ORAL | Status: DC
  Administered 2020-05-09 – 2020-05-11 (×3): 40 mg via ORAL
  Filled 2020-05-08 (×4): qty 2

## 2020-05-08 MED ORDER — PREDNISONE 20 MG PO TABS
20.0000 mg | ORAL_TABLET | Freq: Every day | ORAL | Status: DC
  Administered 2020-05-09 – 2020-05-12 (×4): 20 mg via ORAL
  Filled 2020-05-08 (×4): qty 1

## 2020-05-08 MED ORDER — SENNOSIDES-DOCUSATE SODIUM 8.6-50 MG PO TABS
1.0000 | ORAL_TABLET | Freq: Every evening | ORAL | Status: DC | PRN
  Administered 2020-05-09 – 2020-05-10 (×2): 1 via ORAL
  Filled 2020-05-08: qty 1

## 2020-05-08 MED ORDER — ONDANSETRON HCL 4 MG PO TABS: 4.0000 mg | ORAL_TABLET | Freq: Four times a day (QID) | ORAL | Status: DC | PRN

## 2020-05-08 MED ORDER — MYCOPHENOLATE SODIUM 180 MG PO TBEC
720.0000 mg | DELAYED_RELEASE_TABLET | Freq: Two times a day (BID) | ORAL | Status: DC
  Administered 2020-05-08 – 2020-05-12 (×8): 720 mg via ORAL
  Filled 2020-05-08 (×11): qty 4

## 2020-05-08 MED ORDER — BUPRENORPHINE HCL-NALOXONE HCL 8-2 MG SL SUBL: 2.0000 | SUBLINGUAL_TABLET | Freq: Every day | SUBLINGUAL | Status: DC

## 2020-05-08 MED ORDER — HYDROMORPHONE HCL 1 MG/ML IJ SOLN
1.0000 mg | Freq: Once | INTRAMUSCULAR | Status: AC
  Administered 2020-05-08: 1 mg via INTRAVENOUS
  Filled 2020-05-08: qty 1

## 2020-05-08 MED ORDER — CLONIDINE HCL 0.1 MG PO TABS
0.2000 mg | ORAL_TABLET | Freq: Two times a day (BID) | ORAL | Status: DC
  Administered 2020-05-09 – 2020-05-12 (×8): 0.2 mg via ORAL
  Filled 2020-05-08 (×8): qty 2

## 2020-05-08 MED ORDER — SORBITOL 70 % SOLN
30.0000 mL | Freq: Every day | Status: DC | PRN
  Filled 2020-05-08: qty 30

## 2020-05-08 MED ORDER — IOHEXOL 300 MG/ML  SOLN
100.0000 mL | Freq: Once | INTRAMUSCULAR | Status: AC | PRN
  Administered 2020-05-08: 80 mL via INTRAVENOUS

## 2020-05-08 MED ORDER — ONDANSETRON HCL 4 MG/2ML IJ SOLN
4.0000 mg | Freq: Once | INTRAMUSCULAR | Status: AC
  Administered 2020-05-08: 4 mg via INTRAVENOUS
  Filled 2020-05-08: qty 2

## 2020-05-08 MED ORDER — DEXTROSE IN LACTATED RINGERS 5 % IV SOLN: INTRAVENOUS | Status: DC

## 2020-05-08 MED ORDER — ONDANSETRON HCL 4 MG/2ML IJ SOLN: 4.0000 mg | Freq: Four times a day (QID) | INTRAMUSCULAR | Status: DC | PRN

## 2020-05-08 NOTE — ED Notes (Addendum)
Patient called out for pain medication, this nurse arrived with PRN Tramadol order. Patient became upset stating "Why are the not treating me with IV narcotics like they said Id get? That shit does not work and I will just go home and be in pain if that's all they are going to give me" This nurse apologized for any miscommunication that I will double check with the provider, that I brought in the only pain medication ordered at this time but will see if she can have something else. Patient states "[name] been a patient in the hospital enough times I know how this works. But its fine I know your director will follow up about my stay when I leave."

## 2020-05-08 NOTE — ED Triage Notes (Addendum)
Pt arrived via walk in, c/o generalized abd pain x2 days, n/v and diarrhea. Denies any urinary issues.

## 2020-05-08 NOTE — ED Provider Notes (Signed)
[facility] EMERGENCY DEPARTMENT Provider Note  CSN: [id number] Arrival date & time: 05/08/20 1755    History Chief Complaint  Patient presents with  . Abdominal Pain    HPI  [name] is a 44 y.o. female with history of kidney transplant in 2010 reports 2 days of severe mid sternal pain, associated with occasional nausea/vomiting. No diarrhea or constipation. She has not noted any blood in her emesis. She denies fever. She was seen at [facility] and sent to the ED for evaluation. She has had numerous previous abdominal surgeries. She takes Zubsolv (buprenorphine/naloxone) for prior opiate addiction.    History reviewed. No pertinent past medical history.  Past Surgical History:  Procedure Laterality Date  . APPENDECTOMY    . CESAREAN SECTION    . CHOLECYSTECTOMY    . KIDNEY TRANSPLANT Right   . TUBAL LIGATION    . vein ligation    . VEIN LIGATION      History reviewed. No pertinent family history.  Social History   Tobacco Use  . Smoking status: Current Every Day Smoker    Packs/day: 0.75    Types: Cigarettes  . Smokeless tobacco: Never Used  Substance Use Topics  . Alcohol use: No  . Drug use: No     Home Medications Prior to Admission medications   Medication Sig Start Date End Date Taking? Authorizing Provider  aspirin 81 MG EC tablet Take 81 mg by mouth daily.    Yes [provider]  buprenorphine-naloxone (SUBOXONE) 8-2 mg SUBL SL tablet Place 2 tablets under the tongue daily.    Yes [provider]  cloNIDine (CATAPRES) 0.2 MG tablet Take 0.2 mg by mouth 2 (two) times daily.   Yes [provider]  clopidogrel (PLAVIX) 75 MG tablet Take 75 mg by mouth daily. 03/18/20  Yes [provider]  famotidine (PEPCID) 40 MG tablet Take 40 mg by mouth at bedtime. 01/19/20  Yes [provider]  mycophenolate (MYFORTIC) 360 MG TBEC Take 720 mg by mouth 2 (two) times daily.     Yes [provider]  predniSONE  (DELTASONE) 10 MG tablet Take 20 mg by mouth daily.     Yes [provider]  rosuvastatin (CRESTOR) 20 MG tablet Take 20 mg by mouth daily. 04/13/20  Yes [provider]  tacrolimus (PROGRAF) 1 MG capsule Take 3 mg by mouth 2 (two) times daily.     Yes [provider]     Allergies    Metoclopramide, Ciprofloxacin, Morphine, and Vancomycin   Review of Systems   Review of Systems A comprehensive review of systems was completed and negative except as noted in HPI.    Physical Exam BP (!) 158/91 (BP Location: Left Arm)   Pulse 75   Temp 98 F (36.7 C) (Oral)   Resp 19   Ht 5\' 1"  (1.549 m)   Wt 82.7 kg   SpO2 95%   BMI 34.46 kg/m   Physical Exam Vitals and nursing note reviewed.  Constitutional:      Appearance: Normal appearance.  HENT:     Head: Normocephalic and atraumatic.     Nose: Nose normal.     Mouth/Throat:     Mouth: Mucous membranes are moist.  Eyes:     Extraocular Movements: Extraocular movements intact.     Conjunctiva/sclera: Conjunctivae normal.  Cardiovascular:     Rate and Rhythm: Normal rate.  Pulmonary:     Effort: Pulmonary effort is normal.  Breath sounds: Normal breath sounds.  Abdominal:     General: Abdomen is flat.     Palpations: Abdomen is soft.     Tenderness: There is abdominal tenderness in the periumbilical area. There is no guarding. Negative signs include Murphy's sign and McBurney's sign.  Musculoskeletal:        General: No swelling. Normal range of motion.     Cervical back: Neck supple.  Skin:    General: Skin is warm and dry.  Neurological:     General: No focal deficit present.     Mental Status: She is alert.  Psychiatric:        Mood and Affect: Mood normal.      ED Results / Procedures / Treatments   Labs (all labs ordered are listed, but only abnormal results are displayed) Labs Reviewed  COMPREHENSIVE METABOLIC PANEL - Abnormal; Notable for the following components:      Result  Value   Potassium 3.4 (*)    CO2 18 (*)    BUN 22 (*)    Creatinine, Ser 1.59 (*)    Calcium 8.7 (*)    Albumin 3.3 (*)    AST 13 (*)    GFR calc non Af Amer 40 (*)    GFR calc Af Amer 46 (*)    All other components within normal limits  LIPASE, BLOOD - Abnormal; Notable for the following components:   Lipase 146 (*)    All other components within normal limits  CBC WITH DIFFERENTIAL/PLATELET - Abnormal; Notable for the following components:   WBC 17.4 (*)    RBC 5.76 (*)    Hemoglobin 16.2 (*)    HCT 47.5 (*)    Neutro Abs 15.2 (*)    Abs Immature Granulocytes 0.09 (*)    All other components within normal limits  RESPIRATORY PANEL BY RT PCR (FLU A&B, COVID)  HCG, QUANTITATIVE, PREGNANCY  URINALYSIS, ROUTINE W REFLEX MICROSCOPIC    EKG None  Radiology CT Abdomen Pelvis W Contrast  Result Date: 05/08/2020 CLINICAL DATA:  Generalized abdominal pain x2 days. EXAM: CT ABDOMEN AND PELVIS WITH CONTRAST TECHNIQUE: Multidetector CT imaging of the abdomen and pelvis was performed using the standard protocol following bolus administration of intravenous contrast. CONTRAST:  37mL OMNIPAQUE IOHEXOL 300 MG/ML  SOLN COMPARISON:  March 08, 2014 FINDINGS: Lower chest: No acute abnormality. Hepatobiliary: No focal liver abnormality is seen. Status post cholecystectomy. No biliary dilatation. Pancreas: Mild peripancreatic inflammatory fat stranding is seen. This is seen inferior to the body of the pancreas and extends inferiorly along the midline of the mid abdomen. The parenchyma of the pancreas is normal in appearance. Spleen: Normal in size without focal abnormality. Adrenals/Urinary Tract: Adrenal glands are unremarkable. The native kidneys are atrophic in appearance. A renal transplant is seen within the pelvis on the right. There is no evidence of renal calculi, focal lesions or hydronephrosis. Bladder is unremarkable. Stomach/Bowel: Stomach is within normal limits. The appendix is surgically  absent. No evidence of bowel wall thickening, distention, or inflammatory changes. Vascular/Lymphatic: There is mild calcification of the abdominal aorta, without evidence of aneurysmal dilatation or dissection. No enlarged abdominal or pelvic lymph nodes. Reproductive: The uterus is normal in size and appearance. A 3.6 cm x 2.4 cm cyst is seen along the posterior aspect of the left adnexa. Other: A tortuous vessel is seen within the superficial subcutaneous fat along the lateral aspect of the abdominal and pelvic wall on the right. This extends inferiorly to  the level of the right groin and is seen on the prior study. No abdominopelvic ascites. Musculoskeletal: No acute or significant osseous findings. IMPRESSION: 1. Findings consistent with mild acute pancreatitis. Correlation with pancreatic enzymes is recommended. 2. Right pelvic renal transplant. 3. 3.6 cm x 2.4 cm left adnexal cyst, likely ovarian in origin. Correlation with pelvic ultrasound is recommended. 4. Evidence of prior cholecystectomy. Aortic Atherosclerosis (ICD10-I70.0). Electronically Signed   By: Aram Candela M.D.   On: 05/08/2020 21:41    Procedures Procedures  Medications Ordered in the ED Medications  0.9 %  sodium chloride infusion (has no administration in time range)  HYDROmorphone (DILAUDID) injection 1 mg (1 mg Intravenous Given 05/08/20 1853)  ondansetron (ZOFRAN) injection 4 mg (4 mg Intravenous Given 05/08/20 1855)  iohexol (OMNIPAQUE) 300 MG/ML solution 100 mL (80 mLs Intravenous Contrast Given 05/08/20 2110)  HYDROmorphone (DILAUDID) injection 1 mg (1 mg Intravenous Given 05/08/20 2104)     MDM Rules/Calculators/A&P MDM Patient noted to by hypertensive initially, improved after resting in bed. Will check labs and send for CT. Pain and nausea meds for comfort.  ED Course  I have reviewed the triage vital signs and the nursing notes.  Pertinent labs & imaging results that were available during my care of the  patient were reviewed by me and considered in my medical decision making (see chart for details).  Clinical Course as of May 08 2221  Mon May 08, 2020  1933 WBC is elevated to 17. No anemia.    [CS]  1951 Mildly elevated lipase, creatinine is about at baseline. Awaiting CT.    [CS]  2204 CT images reviewed, evidence of pancreatitis correlates with labs. Still having significant pain. Will admit for hydration, pain control and bowel rest.    [CS]  2220 Spoke with Dr. Gerri Lins, Hospitalist, who will evaluate the patient for admission.    [CS]    Clinical Course User Index [CS] Pollyann Savoy, MD    Final Clinical Impression(s) / ED Diagnoses Final diagnoses:  Acute pancreatitis without infection or necrosis, unspecified pancreatitis type    Rx / DC Orders ED Discharge Orders    None       Pollyann Savoy, MD 05/08/20 2222

## 2020-05-08 NOTE — ED Notes (Signed)
Patient provided with another pillow per request.

## 2020-05-09 DIAGNOSIS — Z940 Kidney transplant status: Secondary | ICD-10-CM

## 2020-05-09 DIAGNOSIS — N83201 Unspecified ovarian cyst, right side: Secondary | ICD-10-CM | POA: Diagnosis present

## 2020-05-09 DIAGNOSIS — K853 Drug induced acute pancreatitis without necrosis or infection: Secondary | ICD-10-CM | POA: Diagnosis present

## 2020-05-09 DIAGNOSIS — Z20822 Contact with and (suspected) exposure to covid-19: Secondary | ICD-10-CM | POA: Diagnosis present

## 2020-05-09 DIAGNOSIS — G894 Chronic pain syndrome: Secondary | ICD-10-CM | POA: Diagnosis present

## 2020-05-09 DIAGNOSIS — R1010 Upper abdominal pain, unspecified: Secondary | ICD-10-CM

## 2020-05-09 DIAGNOSIS — K8500 Idiopathic acute pancreatitis without necrosis or infection: Secondary | ICD-10-CM | POA: Diagnosis not present

## 2020-05-09 DIAGNOSIS — R197 Diarrhea, unspecified: Secondary | ICD-10-CM | POA: Diagnosis present

## 2020-05-09 DIAGNOSIS — T474X5A Adverse effect of other laxatives, initial encounter: Secondary | ICD-10-CM | POA: Diagnosis present

## 2020-05-09 DIAGNOSIS — R1013 Epigastric pain: Secondary | ICD-10-CM | POA: Diagnosis not present

## 2020-05-09 DIAGNOSIS — K859 Acute pancreatitis without necrosis or infection, unspecified: Secondary | ICD-10-CM

## 2020-05-09 DIAGNOSIS — Z885 Allergy status to narcotic agent status: Secondary | ICD-10-CM | POA: Diagnosis not present

## 2020-05-09 DIAGNOSIS — F17210 Nicotine dependence, cigarettes, uncomplicated: Secondary | ICD-10-CM | POA: Diagnosis present

## 2020-05-09 DIAGNOSIS — I129 Hypertensive chronic kidney disease with stage 1 through stage 4 chronic kidney disease, or unspecified chronic kidney disease: Secondary | ICD-10-CM | POA: Diagnosis present

## 2020-05-09 DIAGNOSIS — D84821 Immunodeficiency due to drugs: Secondary | ICD-10-CM | POA: Diagnosis present

## 2020-05-09 DIAGNOSIS — Z881 Allergy status to other antibiotic agents status: Secondary | ICD-10-CM | POA: Diagnosis not present

## 2020-05-09 DIAGNOSIS — T380X5A Adverse effect of glucocorticoids and synthetic analogues, initial encounter: Secondary | ICD-10-CM | POA: Diagnosis present

## 2020-05-09 DIAGNOSIS — F1120 Opioid dependence, uncomplicated: Secondary | ICD-10-CM | POA: Diagnosis present

## 2020-05-09 DIAGNOSIS — Y92009 Unspecified place in unspecified non-institutional (private) residence as the place of occurrence of the external cause: Secondary | ICD-10-CM | POA: Diagnosis not present

## 2020-05-09 DIAGNOSIS — I10 Essential (primary) hypertension: Secondary | ICD-10-CM | POA: Diagnosis not present

## 2020-05-09 DIAGNOSIS — N83202 Unspecified ovarian cyst, left side: Secondary | ICD-10-CM | POA: Diagnosis present

## 2020-05-09 DIAGNOSIS — Z79899 Other long term (current) drug therapy: Secondary | ICD-10-CM | POA: Diagnosis not present

## 2020-05-09 DIAGNOSIS — N1832 Chronic kidney disease, stage 3b: Secondary | ICD-10-CM | POA: Diagnosis present

## 2020-05-09 DIAGNOSIS — Z7952 Long term (current) use of systemic steroids: Secondary | ICD-10-CM | POA: Diagnosis not present

## 2020-05-09 DIAGNOSIS — R109 Unspecified abdominal pain: Secondary | ICD-10-CM | POA: Diagnosis present

## 2020-05-09 DIAGNOSIS — Z888 Allergy status to other drugs, medicaments and biological substances status: Secondary | ICD-10-CM | POA: Diagnosis not present

## 2020-05-09 DIAGNOSIS — Z9049 Acquired absence of other specified parts of digestive tract: Secondary | ICD-10-CM | POA: Diagnosis not present

## 2020-05-09 LAB — URINALYSIS, ROUTINE W REFLEX MICROSCOPIC
Bacteria, UA: NONE SEEN
Bilirubin Urine: NEGATIVE
Glucose, UA: NEGATIVE mg/dL
Ketones, ur: NEGATIVE mg/dL
Leukocytes,Ua: NEGATIVE
Nitrite: NEGATIVE
Protein, ur: >=300 mg/dL — AB
Specific Gravity, Urine: 1.031 — ABNORMAL HIGH (ref 1.005–1.030)
pH: 5 (ref 5.0–8.0)

## 2020-05-09 LAB — COMPREHENSIVE METABOLIC PANEL
ALT: 18 U/L (ref 0–44)
AST: 13 U/L — ABNORMAL LOW (ref 15–41)
Albumin: 3.4 g/dL — ABNORMAL LOW (ref 3.5–5.0)
Alkaline Phosphatase: 108 U/L (ref 38–126)
Anion gap: 13 (ref 5–15)
BUN: 20 mg/dL (ref 6–20)
CO2: 16 mmol/L — ABNORMAL LOW (ref 22–32)
Calcium: 9 mg/dL (ref 8.9–10.3)
Chloride: 107 mmol/L (ref 98–111)
Creatinine, Ser: 1.43 mg/dL — ABNORMAL HIGH (ref 0.44–1.00)
GFR calc Af Amer: 53 mL/min — ABNORMAL LOW (ref >60)
GFR calc non Af Amer: 45 mL/min — ABNORMAL LOW (ref >60)
Glucose, Bld: 76 mg/dL (ref 70–99)
Potassium: 3.5 mmol/L (ref 3.5–5.1)
Sodium: 136 mmol/L (ref 135–145)
Total Bilirubin: 0.7 mg/dL (ref 0.3–1.2)
Total Protein: 7.2 g/dL (ref 6.5–8.1)

## 2020-05-09 LAB — CBC
HCT: 47.8 % — ABNORMAL HIGH (ref 36.0–46.0)
Hemoglobin: 16.9 g/dL — ABNORMAL HIGH (ref 12.0–15.0)
MCH: 28.5 pg (ref 26.0–34.0)
MCHC: 35.4 g/dL (ref 30.0–36.0)
MCV: 80.7 fL (ref 80.0–100.0)
Platelets: 252 10*3/uL (ref 150–400)
RBC: 5.92 MIL/uL — ABNORMAL HIGH (ref 3.87–5.11)
RDW: 13.3 % (ref 11.5–15.5)
WBC: 23.4 10*3/uL — ABNORMAL HIGH (ref 4.0–10.5)
nRBC: 0 % (ref 0.0–0.2)

## 2020-05-09 LAB — RESPIRATORY PANEL BY RT PCR (FLU A&B, COVID)
Influenza A by PCR: NEGATIVE
Influenza B by PCR: NEGATIVE
SARS Coronavirus 2 by RT PCR: NEGATIVE

## 2020-05-09 LAB — HIV ANTIBODY (ROUTINE TESTING W REFLEX): HIV Screen 4th Generation wRfx: NONREACTIVE

## 2020-05-09 LAB — PROCALCITONIN: Procalcitonin: 0.29 ng/mL

## 2020-05-09 LAB — LIPID PANEL
Cholesterol: 207 mg/dL — ABNORMAL HIGH (ref 0–200)
HDL: 39 mg/dL — ABNORMAL LOW (ref >40)
LDL Cholesterol: 141 mg/dL — ABNORMAL HIGH (ref 0–99)
Total CHOL/HDL Ratio: 5.3 RATIO
Triglycerides: 134 mg/dL (ref <150)
VLDL: 27 mg/dL (ref 0–40)

## 2020-05-09 LAB — LIPASE, BLOOD: Lipase: 76 U/L — ABNORMAL HIGH (ref 11–51)

## 2020-05-09 MED ORDER — HYDRALAZINE HCL 20 MG/ML IJ SOLN
10.0000 mg | INTRAMUSCULAR | Status: DC | PRN
  Administered 2020-05-09 – 2020-05-11 (×2): 10 mg via INTRAVENOUS
  Filled 2020-05-09 (×2): qty 1

## 2020-05-09 MED ORDER — LABETALOL HCL 5 MG/ML IV SOLN
10.0000 mg | Freq: Once | INTRAVENOUS | Status: AC
  Administered 2020-05-09: 10 mg via INTRAVENOUS
  Filled 2020-05-09: qty 4

## 2020-05-09 NOTE — Progress Notes (Addendum)
Patient seen and examined, admitted by Dr. Gerri Lins this morning.  Briefly 44 year old female with history of renal transplant 10 years ago, also on Suboxone for opiate addiction, presented with nausea vomiting and abdominal pain for 2 days. Patient was found to have mild acute pancreatitis, lipase 146 at the time of admission.  BP also noted to be 208/99 on admission   BP 121/60 (BP Location: Right Arm)   Pulse 70   Temp 98.6 F (37 C) (Oral)   Resp 18   Ht 5\' 1"  (1.549 m)   Wt 82.7 kg   SpO2 95%   BMI 34.46 kg/m   On exam, tender in the epigastric region, NBS, soft   Abdominal pain secondary to acute pancreatitis -Pain currently not well controlled, 7/10,  -Continue n.p.o. status, IV fluids, pain control.  Hold Suboxone today (per patient she had not been taking it for last 3 days) -CT abdomen showed prior cholecystectomy, no biliary dilatation, mild acute pancreatitis -Continue aggressive IV fluid hydration, antiemetics  Essential hypertension -BP is now improving, continue antihypertensives  History of renal transplant Continue transplant medications, including prednisone, Prograf, Myfortic  Leukocytosis Likely due to steroids, trending up, follow blood cultures, currently afebrile If worsening abdominal pain, febrile, will place on IV antibiotics and repeat imaging if needed   Jaanai Salemi M.D.  [facility] 05/09/2020, 12:29 PM

## 2020-05-09 NOTE — ED Notes (Signed)
Attempted report, no answer on receiving unit.

## 2020-05-09 NOTE — H&P (Signed)
[name] is an 44 y.o. female.   Chief Complaint: Generalized abdominal pain x 2 days with nausea and vomiting. HPI: The patient is a 44 yr old woman who carries a medical history of renal transplant 10 years ago. She also takes buprenorphine/naloxone for opiate addiction.   In the ED she was found to be hypertensive with BP of 208/99. O2 saturation is 97% on room air. Heart rate and respiratory rate is normal.   CT of the abdomen demonstrated findings consistent with mild acute pancreatitis. There is also evidence of a prior cholecystectomy. Her lipase is 146. Potassium is low at 3.4. Triglycerides are 134. WBC is 17.4 hemoglobin is 16.2.  [facility] have been consulted to admit the patient for further evaluation and treatment.  History reviewed. No pertinent past medical history.  Past Surgical History:  Procedure Laterality Date  . APPENDECTOMY    . CESAREAN SECTION    . CHOLECYSTECTOMY    . KIDNEY TRANSPLANT Right   . TUBAL LIGATION    . vein ligation    . VEIN LIGATION      History reviewed. No pertinent family history. Social History:  reports that she has been smoking cigarettes. She has been smoking about 0.75 packs per day. She has never used smokeless tobacco. She reports that she does not drink alcohol and does not use drugs. (Not in a hospital admission)   Allergies:  Allergies  Allergen Reactions  . Metoclopramide Anxiety and Other (See Comments)    Anxiety   . Ciprofloxacin Other (See Comments)  . Morphine Other (See Comments)    Other reaction(s): Headache Migraine   . Vancomycin     REACTION: gives patient "red man syndrome"    Pertinent items noted in HPI and remainder of comprehensive ROS otherwise negative.   General appearance: alert, cooperative and moderate distress Head: Normocephalic, without obvious abnormality, atraumatic Eyes: conjunctivae/corneas clear. PERRL, EOM's intact. Fundi benign. Throat: lips, mucosa, and tongue  normal; teeth and gums normal Neck: no adenopathy, no carotid bruit, no JVD, supple, symmetrical, trachea midline and thyroid not enlarged, symmetric, no tenderness/mass/nodules Resp: No increased work of breathing. No wheezes, rales, or rhonchi. No tactile fremitus. Chest wall: no tenderness Cardio: regular rate and rhythm, S1, S2 normal, no murmur, click, rub or gallop GI: Abdomen is distended, diffusely tender. Positive for decreased  bowel sounds. No masses, hernias, or organomegaly is appreciated. Extremities: extremities normal, atraumatic, no cyanosis or edema Pulses: 2+ and symmetric Skin: Skin color, texture, turgor normal. No rashes or lesions Lymph nodes: Cervical, supraclavicular, and axillary nodes normal. Neurologic: Alert and oriented X 3, normal strength and tone. Normal symmetric reflexes. Normal coordination and gait  Results for orders placed or performed during the hospital encounter of 05/08/20 (from the past 48 hour(s))  Comprehensive metabolic panel     Status: Abnormal   Collection Time: 05/08/20  7:04 PM  Result Value Ref Range   Sodium 138 135 - 145 mmol/L   Potassium 3.4 (L) 3.5 - 5.1 mmol/L   Chloride 107 98 - 111 mmol/L   CO2 18 (L) 22 - 32 mmol/L   Glucose, Bld 87 70 - 99 mg/dL    Comment: Glucose reference range applies only to samples taken after fasting for at least 8 hours.   BUN 22 (H) 6 - 20 mg/dL   Creatinine, Ser 4.50 (H) 0.44 - 1.00 mg/dL   Calcium 8.7 (L) 8.9 - 10.3 mg/dL   Total Protein 7.1 6.5 - 8.1 g/dL  Albumin 3.3 (L) 3.5 - 5.0 g/dL   AST 13 (L) 15 - 41 U/L   ALT 18 0 - 44 U/L   Alkaline Phosphatase 100 38 - 126 U/L   Total Bilirubin 0.4 0.3 - 1.2 mg/dL   GFR calc non Af Amer 40 (L) >60 mL/min   GFR calc Af Amer 46 (L) >60 mL/min   Anion gap 13 5 - 15    Comment: Performed at [facility], [address] [place], [place] [address]  Lipase, blood     Status: Abnormal   Collection Time: 05/08/20  7:04 PM  Result  Value Ref Range   Lipase 146 (H) 11 - 51 U/L    Comment: Performed at [facility], [address], [place], [place] [address]  CBC with Differential     Status: Abnormal   Collection Time: 05/08/20  7:04 PM  Result Value Ref Range   WBC 17.4 (H) 4.0 - 10.5 K/uL   RBC 5.76 (H) 3.87 - 5.11 MIL/uL   Hemoglobin 16.2 (H) 12.0 - 15.0 g/dL   HCT 77.4 (H) 36 - 46 %   MCV 82.5 80.0 - 100.0 fL   MCH 28.1 26.0 - 34.0 pg   MCHC 34.1 30.0 - 36.0 g/dL   RDW 12.8 78.6 - 76.7 %   Platelets 186 150 - 400 K/uL   nRBC 0.0 0.0 - 0.2 %   Neutrophils Relative % 87 %   Neutro Abs 15.2 (H) 1.7 - 7.7 K/uL   Lymphocytes Relative 5 %   Lymphs Abs 0.9 0.7 - 4.0 K/uL   Monocytes Relative 6 %   Monocytes Absolute 1.0 0 - 1 K/uL   Eosinophils Relative 1 %   Eosinophils Absolute 0.1 0 - 0 K/uL   Basophils Relative 0 %   Basophils Absolute 0.0 0 - 0 K/uL   Immature Granulocytes 1 %   Abs Immature Granulocytes 0.09 (H) 0.00 - 0.07 K/uL    Comment: Performed at [facility], [address], [place], [place] [address]  hCG, quantitative, pregnancy     Status: None   Collection Time: 05/08/20  7:04 PM  Result Value Ref Range   hCG, Beta Chain, Quant, S <1 <5 mIU/mL    Comment:          GEST. AGE      CONC.  (mIU/mL)   <=1 WEEK        5 - 50     2 WEEKS       50 - 500     3 WEEKS       100 - 10,000     4 WEEKS     1,000 - 30,000     5 WEEKS     3,500 - 115,000   6-8 WEEKS     12,000 - 270,000    12 WEEKS     15,000 - 220,000        FEMALE AND NON-PREGNANT FEMALE:     LESS THAN 5 mIU/mL Performed at [facility], [address], [place], [place] [address]   Lipid panel     Status: Abnormal   Collection Time: 05/08/20  7:04 PM  Result Value Ref Range   Cholesterol 207 (H) 0 - 200 mg/dL   Triglycerides 366 <294 mg/dL   HDL 39 (L) >76 mg/dL   Total CHOL/HDL Ratio 5.3 RATIO   VLDL 27 0 - 40 mg/dL   LDL Cholesterol  141 (H) 0 - 99 mg/dL    Comment:         Total Cholesterol/HDL:CHD Risk Coronary Heart Disease Risk Table                     Men   Women  1/2 Average Risk   3.4   3.3  Average Risk       5.0   4.4  2 X Average Risk   9.6   7.1  3 X Average Risk  23.4   11.0        Use the calculated Patient Ratio above and the CHD Risk Table to determine the patient's CHD Risk.        ATP III CLASSIFICATION (LDL):  <100     mg/dL   Optimal  782-956  mg/dL   Near or Above                    Optimal  130-159  mg/dL   Borderline  213-086  mg/dL   High  >578     mg/dL   Very High Performed at [facility], [address], [place], [place] [address]    @[id number] @  Blood pressure (!) 189/104, pulse 70, temperature 98 F (36.7 C), temperature source Oral, resp. rate 18, height 5\' 1"  (1.549 m), weight 82.7 kg, SpO2 93 %.   Assessment/Plan Problem  Abdominal Pain  Pancreatitis  Essential Hypertension  KIDNEY TRANSPLANTATION, HX OF   Abdominal pain: The patient has nausea, vomiting, and abdominal pain. She has been unable to tolerate PO intake for 2-3 days.  Pancreatitis: Appears to be mild, although the patient's pain is severe. Cause for pancreatitis is unknown. The patient denies alcohol use. Her triglycerides are 134. She denies family history of pancreatitis. She does take Myfortic as part of her anti-rejection regimen. I discussed this with pharmacy. I did not stop the Myfortic. She has been on this medication for 10 years and the incidence of pancreatitis due to Myfortic is less than 1%. Perhaps this can be discussed with her transplant facility tomorrow. The patient will be kept NPO. She will be given IV pain control and antiemetics.  Transplanted kidney: The patient's creatinine is 1.54 today. The patient states that this is where it usually is. She takes Myfortic and prograf to prevent rejection.  Essential Hypertension: The patient's blood pressure is very high in the ED today. This is likely due to her  pain.   DVT Prophylaxis: Lovenox CODE STATUS: Full Code Family Communication: None available Disposition: The patient is being admitted under observation status to a med/surg bed. Status is: Observation  The patient remains OBS appropriate and will d/c before 2 midnights.  Dispo: The patient is from: Home              Anticipated d/c is to: Home              Anticipated d/c date is: 1 day              Patient currently is not medically stable to d/c.  Severity of Illness: The appropriate patient status for this patient is OBSERVATION. Observation status is judged to be reasonable and necessary in order to provide the required intensity of service to ensure the patient's safety. The patient's presenting symptoms, physical exam findings, and initial radiographic and laboratory data in the context of their medical condition is felt to place them at decreased risk for further clinical  deterioration. Furthermore, it is anticipated that the patient will be medically stable for discharge from the hospital within 2 midnights of admission. The following factors support the patient status of observation.   " The patient's presenting symptoms include abdominal pain. " The physical exam findings include Tenderness to abdomen. " The initial radiographic and laboratory data are Positive for pancreatitis.  [name] 05/09/2020, 1:35 AM

## 2020-05-10 ENCOUNTER — Inpatient Hospital Stay (HOSPITAL_COMMUNITY): Payer: Medicare Other

## 2020-05-10 LAB — BASIC METABOLIC PANEL
Anion gap: 11 (ref 5–15)
BUN: 27 mg/dL — ABNORMAL HIGH (ref 6–20)
CO2: 20 mmol/L — ABNORMAL LOW (ref 22–32)
Calcium: 8.1 mg/dL — ABNORMAL LOW (ref 8.9–10.3)
Chloride: 105 mmol/L (ref 98–111)
Creatinine, Ser: 1.67 mg/dL — ABNORMAL HIGH (ref 0.44–1.00)
GFR calc Af Amer: 44 mL/min — ABNORMAL LOW (ref >60)
GFR calc non Af Amer: 38 mL/min — ABNORMAL LOW (ref >60)
Glucose, Bld: 109 mg/dL — ABNORMAL HIGH (ref 70–99)
Potassium: 3.5 mmol/L (ref 3.5–5.1)
Sodium: 136 mmol/L (ref 135–145)

## 2020-05-10 LAB — LIPASE, BLOOD: Lipase: 31 U/L (ref 11–51)

## 2020-05-10 LAB — CBC
HCT: 43.1 % (ref 36.0–46.0)
Hemoglobin: 13.9 g/dL (ref 12.0–15.0)
MCH: 28.1 pg (ref 26.0–34.0)
MCHC: 32.3 g/dL (ref 30.0–36.0)
MCV: 87.2 fL (ref 80.0–100.0)
Platelets: 163 10*3/uL (ref 150–400)
RBC: 4.94 MIL/uL (ref 3.87–5.11)
RDW: 13.6 % (ref 11.5–15.5)
WBC: 13.6 10*3/uL — ABNORMAL HIGH (ref 4.0–10.5)
nRBC: 0 % (ref 0.0–0.2)

## 2020-05-10 MED ORDER — SENNOSIDES-DOCUSATE SODIUM 8.6-50 MG PO TABS
2.0000 | ORAL_TABLET | Freq: Two times a day (BID) | ORAL | Status: DC
  Administered 2020-05-10 – 2020-05-12 (×3): 2 via ORAL
  Filled 2020-05-10 (×5): qty 2

## 2020-05-10 NOTE — Progress Notes (Signed)
PROGRESS NOTE  [name] OFB:[id number] DOB: 08/13/1975 DOA: 05/08/2020 PCP: Marva Panda, NP  HPI/Recap of past 24 hours: The patient is a 44 yr old woman who carries a medical history of renal transplant 10 years ago. She also takes buprenorphine/naloxone for opiate addiction.   In the ED she was found to be hypertensive with BP of 208/99. O2 saturation is 97% on room air. Heart rate and respiratory rate is normal.   CT of the abdomen demonstrated findings consistent with mild acute pancreatitis. There is also evidence of a prior cholecystectomy. Her lipase is 146. Potassium is low at 3.4. Triglycerides are 134. WBC is 17.4 hemoglobin is 16.2.  [facility] have been consulted to admit the patient for further evaluation and treatment.  05/10/20: Reports abdominal pain is 4 out of 10.  Lipase has normalized.  Reports history of back pain.  CT abdomen pelvis done on 05/08/2020 shows no acute osseous findings.  Assessment/Plan: Principal Problem:   Pancreatitis Active Problems:   Essential hypertension   KIDNEY TRANSPLANTATION, HX OF   Abdominal pain   Acute pancreatitis  Abdominal pain secondary to acute pancreatitis Presented with a lipase level greater than 170 Lipase level has normalized Mild abdominal pain on palpation Advancing diet to soft Pain control  CT abdomen 05/08/20 showed prior cholecystectomy, no biliary dilatation, mild acute pancreatitis Continue aggressive IV fluid hydration, antiemetics  Essential hypertension Continue home antihypertensives  History of renal transplant Continue transplant medications, including prednisone, Prograf, Myfortic Monitor renal function and urine output Continue IV fluid and avoid nephrotoxins  Leukocytosis, improving Likely due to steroids, trending up, follow blood cultures, currently afebrile If worsening abdominal pain, febrile, will place on IV antibiotics and repeat imaging if needed  Chronic  back pain No acute osseous fiundings on CT scan  Adnexal cysts She is concerned about the cysts and states she has had intermittent pain in her pelvis Pelvic US Follow results  DVT Prophylaxis: Lovenox CODE STATUS: Full Code Family Communication: None available   Status is: Inpatient    Dispo: The patient is from:Home               Anticipated d/c is PO:EUMP              Anticipated d/c date is:05/11/20              Patient currently not stable for dc, ongoing management of abd pain.       Objective: Vitals:   05/10/20 0533 05/10/20 0900 05/10/20 0959 05/10/20 1341  BP: (!) 151/98 (!) 162/72 (!) 160/72 (!) 157/86  Pulse: (!) 55 (!) 55  (!) 47  Resp: 16 16  14   Temp: 97.8 F (36.6 C)   97.7 F (36.5 C)  TempSrc: Oral Oral  Oral  SpO2: 99% 100%  99%  Weight:      Height:        Intake/Output Summary (Last 24 hours) at 05/10/2020 1821 Last data filed at 05/10/2020 1400 Gross per 24 hour  Intake 2688.88 ml  Output 0 ml  Net 2688.88 ml   Filed Weights   05/08/20 1810  Weight: 82.7 kg    Exam:  . General: 44 y.o. year-old female well developed well nourished in no acute distress.  Alert and oriented x3. . Cardiovascular: Regular rate and rhythm with no rubs or gallops.  No thyromegaly or JVD noted.   46 Respiratory: Clear to auscultation with no wheezes or rales. Good inspiratory effort. . Abdomen: Epigastric  tenderness with mild palpation. Bowel sounds present. . Musculoskeletal: No lower extremity edema. 2/4 pulses in all 4 extremities. Marland Kitchen Psychiatry: Mood is appropriate for condition and setting   Data Reviewed: CBC: Recent Labs  Lab 05/08/20 1904 05/09/20 0344 05/10/20 0320  WBC 17.4* 23.4* 13.6*  NEUTROABS 15.2*  --   --   HGB 16.2* 16.9* 13.9  HCT 47.5* 47.8* 43.1  MCV 82.5 80.7 87.2  PLT 186 252 163   Basic Metabolic Panel: Recent Labs  Lab 05/08/20 1904 05/09/20 0344 05/10/20 0320  NA 138 136 136  K 3.4* 3.5 3.5  CL 107 107 105  CO2  18* 16* 20*  GLUCOSE 87 76 109*  BUN 22* 20 27*  CREATININE 1.59* 1.43* 1.67*  CALCIUM 8.7* 9.0 8.1*   GFR: Estimated Creatinine Clearance: 43.3 mL/min (A) (by C-G formula based on SCr of 1.67 mg/dL (H)). Liver Function Tests: Recent Labs  Lab 05/08/20 1904 05/09/20 0344  AST 13* 13*  ALT 18 18  ALKPHOS 100 108  BILITOT 0.4 0.7  PROT 7.1 7.2  ALBUMIN 3.3* 3.4*   Recent Labs  Lab 05/08/20 1904 05/09/20 0351 05/10/20 0320  LIPASE 146* 76* 31   No results for input(s): AMMONIA in the last 168 hours. Coagulation Profile: No results for input(s): INR, PROTIME in the last 168 hours. Cardiac Enzymes: No results for input(s): CKTOTAL, CKMB, CKMBINDEX, TROPONINI in the last 168 hours. BNP (last 3 results) No results for input(s): PROBNP in the last 8760 hours. HbA1C: No results for input(s): HGBA1C in the last 72 hours. CBG: No results for input(s): GLUCAP in the last 168 hours. Lipid Profile: Recent Labs    05/08/20 1904  CHOL 207*  HDL 39*  LDLCALC 141*  TRIG 134  CHOLHDL 5.3   Thyroid Function Tests: No results for input(s): TSH, T4TOTAL, FREET4, T3FREE, THYROIDAB in the last 72 hours. Anemia Panel: No results for input(s): VITAMINB12, FOLATE, FERRITIN, TIBC, IRON, RETICCTPCT in the last 72 hours. Urine analysis:    Component Value Date/Time   COLORURINE YELLOW 05/09/2020 0200   APPEARANCEUR CLEAR 05/09/2020 0200   LABSPEC 1.031 (H) 05/09/2020 0200   PHURINE 5.0 05/09/2020 0200   GLUCOSEU NEGATIVE 05/09/2020 0200   HGBUR MODERATE (A) 05/09/2020 0200   BILIRUBINUR NEGATIVE 05/09/2020 0200   KETONESUR NEGATIVE 05/09/2020 0200   PROTEINUR >=300 (A) 05/09/2020 0200   UROBILINOGEN 0.2 10/25/2010 1424   NITRITE NEGATIVE 05/09/2020 0200   LEUKOCYTESUR NEGATIVE 05/09/2020 0200   Sepsis Labs: @LABRCNTIP (procalcitonin:4,lacticidven:4)  ) Recent Results (from the past 240 hour(s))  Respiratory Panel by RT PCR (Flu A&B, Covid) -     Status: None   Collection  Time: 05/09/20  2:30 AM   Specimen: Nasopharyngeal  Result Value Ref Range Status   SARS Coronavirus 2 by RT PCR NEGATIVE NEGATIVE Final    Comment: (NOTE) SARS-CoV-2 target nucleic acids are NOT DETECTED.  The SARS-CoV-2 RNA is generally detectable in upper respiratoy specimens during the acute phase of infection. The lowest concentration of SARS-CoV-2 viral copies this assay can detect is 131 copies/mL. A negative result does not preclude SARS-Cov-2 infection and should not be used as the sole basis for treatment or other patient management decisions. A negative result may occur with  improper specimen collection/handling, submission of specimen other than nasopharyngeal swab, presence of viral mutation(s) within the areas targeted by this assay, and inadequate number of viral copies (<131 copies/mL). A negative result must be combined with clinical observations, patient history, and epidemiological information.  The expected result is Negative.  Fact Sheet for Patients:  https://www.moore.com/https://www.fda.gov/media/142436/download  Fact Sheet for Healthcare Providers:  https://www.young.biz/https://www.fda.gov/media/142435/download  This test is no t yet approved or cleared by the [place] FDA and  has been authorized for detection and/or diagnosis of SARS-CoV-2 by FDA under an Emergency Use Authorization (EUA). This EUA will remain  in effect (meaning this test can be used) for the duration of the COVID-19 declaration under Section 564(b)(1) of the Act, 21 U.S.C. section 360bbb-3(b)(1), unless the authorization is terminated or revoked sooner.     Influenza A by PCR NEGATIVE NEGATIVE Final   Influenza B by PCR NEGATIVE NEGATIVE Final    Comment: (NOTE) The Xpert Xpress SARS-CoV-2/FLU/RSV assay is intended as an aid in  the diagnosis of influenza from Nasopharyngeal swab specimens and  should not be used as a sole basis for treatment. Nasal washings and  aspirates are unacceptable for Xpert Xpress  SARS-CoV-2/FLU/RSV  testing.  Fact Sheet for Patients: https://www.moore.com/https://www.fda.gov/media/142436/download  Fact Sheet for Healthcare Providers: https://www.young.biz/https://www.fda.gov/media/142435/download  This test is not yet approved or cleared by the [place] FDA and  has been authorized for detection and/or diagnosis of SARS-CoV-2 by  FDA under an Emergency Use Authorization (EUA). This EUA will remain  in effect (meaning this test can be used) for the duration of the  Covid-19 declaration under Section 564(b)(1) of the Act, 21  U.S.C. section 360bbb-3(b)(1), unless the authorization is  terminated or revoked. Performed at [facility], [address] [place], [place] [address]   Culture, blood (Routine X 2) w Reflex to ID Panel     Status: None (Preliminary result)   Collection Time: 05/09/20 10:09 AM   Specimen: BLOOD LEFT HAND  Result Value Ref Range Status   Specimen Description   Final    BLOOD LEFT HAND Performed at [facility], [address] [place], [place] [address]    Special Requests   Final    BOTTLES DRAWN AEROBIC AND ANAEROBIC Blood Culture adequate volume Performed at [facility], [address] [place], [place] [address]    Culture   Final    NO GROWTH < 24 HOURS Performed at [facility], [address] [place], [place] [address]    Report Status PENDING  Incomplete  Culture, blood (Routine X 2) w Reflex to ID Panel     Status: None (Preliminary result)   Collection Time: 05/09/20 10:22 AM   Specimen: BLOOD LEFT HAND  Result Value Ref Range Status   Specimen Description   Final    BLOOD LEFT HAND Performed at [facility], [address] [place], [place] [address]    Special Requests   Final    BOTTLES DRAWN AEROBIC ONLY Blood Culture adequate volume Performed at [facility], [address] [place], [place] [address]    Culture   Final    NO GROWTH < 24 HOURS  Performed at [facility], [address] [place], [place] [address]    Report Status PENDING  Incomplete      Studies: No results found.  Scheduled Meds: . cloNIDine  0.2 mg Oral BID  . clopidogrel  75 mg Oral Daily  . enoxaparin (LOVENOX) injection  40 mg Subcutaneous Q24H  . famotidine  40 mg Oral QHS  . mycophenolate  720 mg Oral BID  . predniSONE  20 mg Oral QPC breakfast  . senna-docusate  2 tablet Oral BID  .  tacrolimus  3 mg Oral BID    Continuous Infusions: . dextrose 5% lactated ringers 150 mL/hr at 05/10/20 1206     LOS: 1 day     Darlin Drop, MD [facility] Pager [phone]  If 7PM-7AM, please contact night-coverage [email or web address] Password [facility] 05/10/2020, 6:21 PM

## 2020-05-11 LAB — CBC
HCT: 37.3 % (ref 36.0–46.0)
Hemoglobin: 12.7 g/dL (ref 12.0–15.0)
MCH: 28.3 pg (ref 26.0–34.0)
MCHC: 34 g/dL (ref 30.0–36.0)
MCV: 83.3 fL (ref 80.0–100.0)
Platelets: 148 10*3/uL — ABNORMAL LOW (ref 150–400)
RBC: 4.48 MIL/uL (ref 3.87–5.11)
RDW: 13.6 % (ref 11.5–15.5)
WBC: 10.6 10*3/uL — ABNORMAL HIGH (ref 4.0–10.5)
nRBC: 0 % (ref 0.0–0.2)

## 2020-05-11 LAB — BASIC METABOLIC PANEL
Anion gap: 10 (ref 5–15)
BUN: 27 mg/dL — ABNORMAL HIGH (ref 6–20)
CO2: 17 mmol/L — ABNORMAL LOW (ref 22–32)
Calcium: 8.1 mg/dL — ABNORMAL LOW (ref 8.9–10.3)
Chloride: 109 mmol/L (ref 98–111)
Creatinine, Ser: 1.59 mg/dL — ABNORMAL HIGH (ref 0.44–1.00)
GFR calc Af Amer: 46 mL/min — ABNORMAL LOW (ref >60)
GFR calc non Af Amer: 40 mL/min — ABNORMAL LOW (ref >60)
Glucose, Bld: 105 mg/dL — ABNORMAL HIGH (ref 70–99)
Potassium: 3.8 mmol/L (ref 3.5–5.1)
Sodium: 136 mmol/L (ref 135–145)

## 2020-05-11 MED ORDER — HYDRALAZINE HCL 10 MG PO TABS
10.0000 mg | ORAL_TABLET | Freq: Three times a day (TID) | ORAL | Status: DC
  Administered 2020-05-11 (×3): 10 mg via ORAL
  Filled 2020-05-11 (×4): qty 1

## 2020-05-11 NOTE — Discharge Summary (Addendum)
PROGRESS NOTE  [name] WFU:[id number] DOB: May 29, 1976 DOA: 05/08/2020 PCP: Marva Panda, NP  HPI/Recap of past 24 hours: The patient is a 44 yr old woman who carries a medical history of renal transplant 10 years ago on chronic steroids, prograf, and myfortic. She also takes buprenorphine/naloxone for opiate addiction.  She presented to the ED due to generalized abdominal pain associated with nausea and vomiting x2 days.  Work-up revealed mild acute pancreatitis, also seen on CT scan, presented with a lipase of 146.  [facility] was asked to admit.  Hospital course complicated by persistent epigastric pain, nausea with no vomiting despite normalization of lipase level.  05/11/20: Reports abdominal pain is 7 out of 10 this morning prior to taking her pain medication.  Also reports intermittent nausea with no vomiting and watery stool after eating a clear liquid diet last night.    Assessment/Plan: Principal Problem:   Pancreatitis Active Problems:   Essential hypertension   KIDNEY TRANSPLANTATION, HX OF   Abdominal pain   Acute pancreatitis  Intractable abdominal pain suspect multifactorial secondary to resolving acute pancreatitis versus others, need to rule out gastritis, gastropathy or gastroparesis.   Presented with a lipase level greater than 170 Lipase level has normalized She has persistent epigastric pain and difficulty tolerating a diet Continues to require frequent doses of IV narcotics Continue IV fluid Continue pain control Advance diet as tolerated GI consulted to assist with the management.  Suspected gastritis, gastropathy or gastroparesis Reports she has been on chronic steroid for more than 10 years Continues to use NSAIDs for pain control No prior history of EGD GI consulted to further assess  Essential hypertension BP is not at goal, elevated BP may be contributed by pain Continue home clonidine 0.2 mg twice daily Added p.o. hydralazine 10 mg 3  times daily Continue to closely monitor vital signs Pain control  History of renal transplant Continue transplant medications, including prednisone, Prograf, Myfortic Monitor renal function and urine output Continue IV fluid and avoid nephrotoxins  CKD 3B Currently appears to be at her baseline creatinine 1.5 with GFR of 40 Avoid nephrotoxins Monitor urine output Last urine output 120 cc recorded in the last 24-hour, unclear accuracy. Daily renal panel  Leukocytosis, resolving Likely due to steroids, trending up, follow blood cultures, currently afebrile Blood cultures negative to date.  Chronic back pain No acute osseous fiundings on CT scan  Bilateral ovarian cysts Seen on pelvic ultrasound Informed patient and recommended follow-up with GYN outpatient, patient understands and agrees to plan.   DVT Prophylaxis: Lovenox subcu daily CODE STATUS: Full Code Family Communication: None available  Consult: GI, Dr. Elnoria Howard.   Status is: Inpatient    Dispo: The patient is from:Home               Anticipated d/c is KG:URKY              Anticipated d/c date is: 05/12/20              Patient currently not stable for dc, ongoing management of epigastric pain.  Abd pain.       Objective: Vitals:   05/10/20 1341 05/10/20 2158 05/11/20 0509 05/11/20 1100  BP: (!) 157/86 (!) 153/87 (!) 161/93 (!) 188/79  Pulse: (!) 47 (!) 45 (!) 46 (!) 45  Resp: 14 16 16 16   Temp: 97.7 F (36.5 C) 98.1 F (36.7 C) 98.3 F (36.8 C) 98.7 F (37.1 C)  TempSrc: Oral Oral Oral Oral  SpO2:  99% 100% 100% 100%  Weight:      Height:        Intake/Output Summary (Last 24 hours) at 05/11/2020 1317 Last data filed at 05/11/2020 1248 Gross per 24 hour  Intake 1949.7 ml  Output 120 ml  Net 1829.7 ml   Filed Weights   05/08/20 1810  Weight: 82.7 kg    Exam:  . General: 44 y.o. year-old female well-developed well-nourished no acute distress.  Appears uncomfortable due to the epigastric  pain.  Alert and oriented x3.. . Cardiovascular: Regular rate and rhythm no rubs or gallops.   Marland Kitchen Respiratory: Clear to auscultation no wheezes or rales. . Abdomen: Epigastric pain with tenderness on palpation.  Bowel sounds present. . Musculoskeletal: No lower extremity edema bilaterally.   Marland Kitchen Psychiatry: Mood is appropriate for condition and setting.   Data Reviewed: CBC: Recent Labs  Lab 05/08/20 1904 05/09/20 0344 05/10/20 0320 05/11/20 0404  WBC 17.4* 23.4* 13.6* 10.6*  NEUTROABS 15.2*  --   --   --   HGB 16.2* 16.9* 13.9 12.7  HCT 47.5* 47.8* 43.1 37.3  MCV 82.5 80.7 87.2 83.3  PLT 186 252 163 148*   Basic Metabolic Panel: Recent Labs  Lab 05/08/20 1904 05/09/20 0344 05/10/20 0320 05/11/20 0749  NA 138 136 136 136  K 3.4* 3.5 3.5 3.8  CL 107 107 105 109  CO2 18* 16* 20* 17*  GLUCOSE 87 76 109* 105*  BUN 22* 20 27* 27*  CREATININE 1.59* 1.43* 1.67* 1.59*  CALCIUM 8.7* 9.0 8.1* 8.1*   GFR: Estimated Creatinine Clearance: 45.4 mL/min (A) (by C-G formula based on SCr of 1.59 mg/dL (H)). Liver Function Tests: Recent Labs  Lab 05/08/20 1904 05/09/20 0344  AST 13* 13*  ALT 18 18  ALKPHOS 100 108  BILITOT 0.4 0.7  PROT 7.1 7.2  ALBUMIN 3.3* 3.4*   Recent Labs  Lab 05/08/20 1904 05/09/20 0351 05/10/20 0320  LIPASE 146* 76* 31   No results for input(s): AMMONIA in the last 168 hours. Coagulation Profile: No results for input(s): INR, PROTIME in the last 168 hours. Cardiac Enzymes: No results for input(s): CKTOTAL, CKMB, CKMBINDEX, TROPONINI in the last 168 hours. BNP (last 3 results) No results for input(s): PROBNP in the last 8760 hours. HbA1C: No results for input(s): HGBA1C in the last 72 hours. CBG: No results for input(s): GLUCAP in the last 168 hours. Lipid Profile: Recent Labs    05/08/20 1904  CHOL 207*  HDL 39*  LDLCALC 141*  TRIG 134  CHOLHDL 5.3   Thyroid Function Tests: No results for input(s): TSH, T4TOTAL, FREET4, T3FREE,  THYROIDAB in the last 72 hours. Anemia Panel: No results for input(s): VITAMINB12, FOLATE, FERRITIN, TIBC, IRON, RETICCTPCT in the last 72 hours. Urine analysis:    Component Value Date/Time   COLORURINE YELLOW 05/09/2020 0200   APPEARANCEUR CLEAR 05/09/2020 0200   LABSPEC 1.031 (H) 05/09/2020 0200   PHURINE 5.0 05/09/2020 0200   GLUCOSEU NEGATIVE 05/09/2020 0200   HGBUR MODERATE (A) 05/09/2020 0200   BILIRUBINUR NEGATIVE 05/09/2020 0200   KETONESUR NEGATIVE 05/09/2020 0200   PROTEINUR >=300 (A) 05/09/2020 0200   UROBILINOGEN 0.2 10/25/2010 1424   NITRITE NEGATIVE 05/09/2020 0200   LEUKOCYTESUR NEGATIVE 05/09/2020 0200   Sepsis Labs: @LABRCNTIP (procalcitonin:4,lacticidven:4)  ) Recent Results (from the past 240 hour(s))  Respiratory Panel by RT PCR (Flu A&B, Covid) -     Status: None   Collection Time: 05/09/20  2:30 AM   Specimen: Nasopharyngeal  Result Value Ref Range Status   SARS Coronavirus 2 by RT PCR NEGATIVE NEGATIVE Final    Comment: (NOTE) SARS-CoV-2 target nucleic acids are NOT DETECTED.  The SARS-CoV-2 RNA is generally detectable in upper respiratoy specimens during the acute phase of infection. The lowest concentration of SARS-CoV-2 viral copies this assay can detect is 131 copies/mL. A negative result does not preclude SARS-Cov-2 infection and should not be used as the sole basis for treatment or other patient management decisions. A negative result may occur with  improper specimen collection/handling, submission of specimen other than nasopharyngeal swab, presence of viral mutation(s) within the areas targeted by this assay, and inadequate number of viral copies (<131 copies/mL). A negative result must be combined with clinical observations, patient history, and epidemiological information. The expected result is Negative.  Fact Sheet for Patients:  https://www.moore.com/https://www.fda.gov/media/142436/download  Fact Sheet for Healthcare Providers:    https://www.young.biz/https://www.fda.gov/media/142435/download  This test is no t yet approved or cleared by the [place] FDA and  has been authorized for detection and/or diagnosis of SARS-CoV-2 by FDA under an Emergency Use Authorization (EUA). This EUA will remain  in effect (meaning this test can be used) for the duration of the COVID-19 declaration under Section 564(b)(1) of the Act, 21 U.S.C. section 360bbb-3(b)(1), unless the authorization is terminated or revoked sooner.     Influenza A by PCR NEGATIVE NEGATIVE Final   Influenza B by PCR NEGATIVE NEGATIVE Final    Comment: (NOTE) The Xpert Xpress SARS-CoV-2/FLU/RSV assay is intended as an aid in  the diagnosis of influenza from Nasopharyngeal swab specimens and  should not be used as a sole basis for treatment. Nasal washings and  aspirates are unacceptable for Xpert Xpress SARS-CoV-2/FLU/RSV  testing.  Fact Sheet for Patients: https://www.moore.com/https://www.fda.gov/media/142436/download  Fact Sheet for Healthcare Providers: https://www.young.biz/https://www.fda.gov/media/142435/download  This test is not yet approved or cleared by the [place] FDA and  has been authorized for detection and/or diagnosis of SARS-CoV-2 by  FDA under an Emergency Use Authorization (EUA). This EUA will remain  in effect (meaning this test can be used) for the duration of the  Covid-19 declaration under Section 564(b)(1) of the Act, 21  U.S.C. section 360bbb-3(b)(1), unless the authorization is  terminated or revoked. Performed at [facility], [address] [place], [place] [address]   Culture, blood (Routine X 2) w Reflex to ID Panel     Status: None (Preliminary result)   Collection Time: 05/09/20 10:09 AM   Specimen: BLOOD LEFT HAND  Result Value Ref Range Status   Specimen Description   Final    BLOOD LEFT HAND Performed at [facility], [address] [place], [place] [address]    Special Requests   Final    BOTTLES DRAWN AEROBIC AND  ANAEROBIC Blood Culture adequate volume Performed at [facility], [address] [place], [place] [address]    Culture   Final    NO GROWTH 2 DAYS Performed at [facility], [address] [place], [place] [address]    Report Status PENDING  Incomplete  Culture, blood (Routine X 2) w Reflex to ID Panel     Status: None (Preliminary result)   Collection Time: 05/09/20 10:22 AM   Specimen: BLOOD LEFT HAND  Result Value Ref Range Status   Specimen Description   Final    BLOOD LEFT HAND Performed at [facility], [address] [place], [place] [address]    Special Requests  Final    BOTTLES DRAWN AEROBIC ONLY Blood Culture adequate volume Performed at [facility], [address], [place], [place] [address]    Culture   Final    NO GROWTH 2 DAYS Performed at [facility], [address] [place], [place] [address]    Report Status PENDING  Incomplete      Studies: US PELVIC COMPLETE WITH TRANSVAGINAL  Result Date: 05/10/2020 CLINICAL DATA:  Left adnexal cyst. EXAM: TRANSABDOMINAL AND TRANSVAGINAL ULTRASOUND OF PELVIS DOPPLER ULTRASOUND OF OVARIES TECHNIQUE: Both transabdominal and transvaginal ultrasound examinations of the pelvis were performed. Transabdominal technique was performed for global imaging of the pelvis including uterus, ovaries, adnexal regions, and pelvic cul-de-sac. It was necessary to proceed with endovaginal exam following the transabdominal exam to visualize the uterus and bilateral ovaries. Color and duplex Doppler ultrasound was utilized to evaluate blood flow to the ovaries. COMPARISON:  None. FINDINGS: Uterus Measurements: 10.2 cm x 5.0 cm x 4.5 cm = volume: 120 mL. No fibroids or other mass visualized. Endometrium Thickness: 10.9 mm.  No focal abnormality visualized. Right ovary Measurements: 2.3 cm x 1.7 cm x 2.0 cm = volume: 4.0 mL. A 2.0 cm x 1.3 cm x 1.3 cm anechoic structure is seen  within the right ovary. No flow is seen within this region on color Doppler evaluation. Left ovary Measurements: 4.0 cm x 2.7 cm x 3.8 cm = volume: 21.3 mL. A 3.6 cm x 2.4 cm x 2.8 cm anechoic structure is seen within the left ovary. No flow is seen within this region on color Doppler evaluation. Pulsed Doppler evaluation of both ovaries demonstrates normal low-resistance arterial and venous waveforms. Other findings No abnormal free fluid. IMPRESSION: Bilateral ovarian cysts. Electronically Signed   By: Aram Candela M.D.   On: 05/10/2020 20:23    Scheduled Meds: . cloNIDine  0.2 mg Oral BID  . clopidogrel  75 mg Oral Daily  . enoxaparin (LOVENOX) injection  40 mg Subcutaneous Q24H  . famotidine  40 mg Oral QHS  . hydrALAZINE  10 mg Oral Q8H  . mycophenolate  720 mg Oral BID  . predniSONE  20 mg Oral QPC breakfast  . senna-docusate  2 tablet Oral BID  . tacrolimus  3 mg Oral BID    Continuous Infusions: . dextrose 5% lactated ringers 150 mL/hr at 05/11/20 0827     LOS: 2 days     Darlin Drop, MD [facility] Pager [phone]  If 7PM-7AM, please contact night-coverage [email or web address] Password [email or web address] 05/11/2020, 1:17 PM

## 2020-05-11 NOTE — Consult Note (Signed)
UNASSIGNED CONSULT  Reason for Consult: Acute pancreatitis Referring Physician: [facility]  Godfrey Pick Lederman HPI: This is a 44 year old female with a PMH of renal transplantation 10 years ago at [facility], s/p cholecystectomy, and opioid addiction admitted for an acute pancreatitis.  Her symptoms started acutely last Saturday when her pain woke her up at 6 AM.  She explained that the pain felt as if it was a muscular tearing sensation and it waas associated with nausea and vomiting.  She recently moved to [place], [place], but he used to live here in [place].  She was visiting her mother, here in [place], as she suffered with a CVA the week before.  Even with the pain she did not want to present to the hospital as she wanted to be with her mother.  Unfortunately, her symptoms did not abate and she relented to the being evaluated.  The CT scan of the abdomen shows a mild pancreatitis and there is a mild elevation of her lipase at 146.  She denies any prior history of pancreatitis and she does not drink any ETOH.  Recently, in addition to her baseline immunosuppression with prednisone, she was provided a Medrol Dose Pack for back pain a couple of weeks ago.  History reviewed. No pertinent past medical history.  Past Surgical History:  Procedure Laterality Date  . APPENDECTOMY    . CESAREAN SECTION    . CHOLECYSTECTOMY    . KIDNEY TRANSPLANT Right   . TUBAL LIGATION    . vein ligation    . VEIN LIGATION      History reviewed. No pertinent family history.  Social History:  reports that she has been smoking cigarettes. She has been smoking about 0.75 packs per day. She has never used smokeless tobacco. She reports that she does not drink alcohol and does not use drugs.  Allergies:  Allergies  Allergen Reactions  . Metoclopramide Anxiety and Other (See Comments)    Anxiety   . Ciprofloxacin Other (See Comments)  . Morphine Other (See Comments)    Other reaction(s): Headache Migraine    . Vancomycin     REACTION: gives patient "red man syndrome"    Medications:  Scheduled: . cloNIDine  0.2 mg Oral BID  . clopidogrel  75 mg Oral Daily  . enoxaparin (LOVENOX) injection  40 mg Subcutaneous Q24H  . famotidine  40 mg Oral QHS  . hydrALAZINE  10 mg Oral Q8H  . mycophenolate  720 mg Oral BID  . predniSONE  20 mg Oral QPC breakfast  . senna-docusate  2 tablet Oral BID  . tacrolimus  3 mg Oral BID   Continuous: . dextrose 5% lactated ringers 150 mL/hr at 05/11/20 1400    Results for orders placed or performed during the hospital encounter of 05/08/20 (from the past 24 hour(s))  CBC     Status: Abnormal   Collection Time: 05/11/20  4:04 AM  Result Value Ref Range   WBC 10.6 (H) 4.0 - 10.5 K/uL   RBC 4.48 3.87 - 5.11 MIL/uL   Hemoglobin 12.7 12.0 - 15.0 g/dL   HCT 45.8 36 - 46 %   MCV 83.3 80.0 - 100.0 fL   MCH 28.3 26.0 - 34.0 pg   MCHC 34.0 30.0 - 36.0 g/dL   RDW 09.9 83.3 - 82.5 %   Platelets 148 (L) 150 - 400 K/uL   nRBC 0.0 0.0 - 0.2 %  Basic metabolic panel     Status: Abnormal  Collection Time: 05/11/20  7:49 AM  Result Value Ref Range   Sodium 136 135 - 145 mmol/L   Potassium 3.8 3.5 - 5.1 mmol/L   Chloride 109 98 - 111 mmol/L   CO2 17 (L) 22 - 32 mmol/L   Glucose, Bld 105 (H) 70 - 99 mg/dL   BUN 27 (H) 6 - 20 mg/dL   Creatinine, Ser 4.31 (H) 0.44 - 1.00 mg/dL   Calcium 8.1 (L) 8.9 - 10.3 mg/dL   GFR calc non Af Amer 40 (L) >60 mL/min   GFR calc Af Amer 46 (L) >60 mL/min   Anion gap 10 5 - 15     US PELVIC COMPLETE WITH TRANSVAGINAL  Result Date: 05/10/2020 CLINICAL DATA:  Left adnexal cyst. EXAM: TRANSABDOMINAL AND TRANSVAGINAL ULTRASOUND OF PELVIS DOPPLER ULTRASOUND OF OVARIES TECHNIQUE: Both transabdominal and transvaginal ultrasound examinations of the pelvis were performed. Transabdominal technique was performed for global imaging of the pelvis including uterus, ovaries, adnexal regions, and pelvic cul-de-sac. It was necessary to proceed with  endovaginal exam following the transabdominal exam to visualize the uterus and bilateral ovaries. Color and duplex Doppler ultrasound was utilized to evaluate blood flow to the ovaries. COMPARISON:  None. FINDINGS: Uterus Measurements: 10.2 cm x 5.0 cm x 4.5 cm = volume: 120 mL. No fibroids or other mass visualized. Endometrium Thickness: 10.9 mm.  No focal abnormality visualized. Right ovary Measurements: 2.3 cm x 1.7 cm x 2.0 cm = volume: 4.0 mL. A 2.0 cm x 1.3 cm x 1.3 cm anechoic structure is seen within the right ovary. No flow is seen within this region on color Doppler evaluation. Left ovary Measurements: 4.0 cm x 2.7 cm x 3.8 cm = volume: 21.3 mL. A 3.6 cm x 2.4 cm x 2.8 cm anechoic structure is seen within the left ovary. No flow is seen within this region on color Doppler evaluation. Pulsed Doppler evaluation of both ovaries demonstrates normal low-resistance arterial and venous waveforms. Other findings No abnormal free fluid. IMPRESSION: Bilateral ovarian cysts. Electronically Signed   By: Aram Candela M.D.   On: 05/10/2020 20:23    ROS:  As stated above in the HPI otherwise negative.  Blood pressure (!) 188/79, pulse (!) 45, temperature 98.7 F (37.1 C), temperature source Oral, resp. rate 16, height 5\' 1"  (1.549 m), weight 82.7 kg, SpO2 100 %.    PE: Gen: NAD, Alert and Oriented HEENT:  Adrian/AT, EOMI Neck: Supple, no LAD Lungs: CTA Bilaterally CV: RRR without M/G/R ABD: Soft, NTND, +BS Ext: No C/C/E  Assessment/Plan: 1) Acute pancreatitis. 2) s/p renal transplant. 3) HTN.   Her pain is improving, but mildly.  She is still very uncomfortable.  There is a possibility that the addition of the Medrol Dose Pack precipitated the pancreatitis.  Imaging is negative for any biliary ductal dilation.  There is currently no indication to perform an EGD.  Plan: 1) IV hydration. 2) Continue with pain medications. 3) Control blood pressure.  Kasandra Fehr D 05/11/2020, 2:05 PM

## 2020-05-11 NOTE — Progress Notes (Signed)
Pt reported seeing a triangle shape over her  Rt eye. MD was notified.

## 2020-05-11 NOTE — Progress Notes (Signed)
Pt states when she drinks juice she has several bowel movements.

## 2020-05-12 LAB — BASIC METABOLIC PANEL
Anion gap: 12 (ref 5–15)
BUN: 20 mg/dL (ref 6–20)
CO2: 20 mmol/L — ABNORMAL LOW (ref 22–32)
Calcium: 8.2 mg/dL — ABNORMAL LOW (ref 8.9–10.3)
Chloride: 109 mmol/L (ref 98–111)
Creatinine, Ser: 1.39 mg/dL — ABNORMAL HIGH (ref 0.44–1.00)
GFR calc Af Amer: 54 mL/min — ABNORMAL LOW (ref >60)
GFR calc non Af Amer: 47 mL/min — ABNORMAL LOW (ref >60)
Glucose, Bld: 105 mg/dL — ABNORMAL HIGH (ref 70–99)
Potassium: 3.8 mmol/L (ref 3.5–5.1)
Sodium: 141 mmol/L (ref 135–145)

## 2020-05-12 LAB — CBC
HCT: 35.4 % — ABNORMAL LOW (ref 36.0–46.0)
Hemoglobin: 11.9 g/dL — ABNORMAL LOW (ref 12.0–15.0)
MCH: 28.1 pg (ref 26.0–34.0)
MCHC: 33.6 g/dL (ref 30.0–36.0)
MCV: 83.7 fL (ref 80.0–100.0)
Platelets: 164 10*3/uL (ref 150–400)
RBC: 4.23 MIL/uL (ref 3.87–5.11)
RDW: 13.5 % (ref 11.5–15.5)
WBC: 9.3 10*3/uL (ref 4.0–10.5)
nRBC: 0 % (ref 0.0–0.2)

## 2020-05-12 LAB — LIPASE, BLOOD: Lipase: 34 U/L (ref 11–51)

## 2020-05-12 MED ORDER — HYDRALAZINE HCL 25 MG PO TABS: 25.0000 mg | ORAL_TABLET | Freq: Three times a day (TID) | ORAL | 0 refills | Status: DC

## 2020-05-12 MED ORDER — TRAMADOL HCL 50 MG PO TABS
50.0000 mg | ORAL_TABLET | Freq: Two times a day (BID) | ORAL | 0 refills | Status: AC | PRN
Start: 2020-05-12 — End: 2020-05-15

## 2020-05-12 MED ORDER — LOPERAMIDE HCL 2 MG PO CAPS: 2.0000 mg | ORAL_CAPSULE | ORAL | Status: DC | PRN

## 2020-05-12 MED ORDER — HYDRALAZINE HCL 25 MG PO TABS
25.0000 mg | ORAL_TABLET | Freq: Three times a day (TID) | ORAL | Status: DC
  Administered 2020-05-12 (×2): 25 mg via ORAL
  Filled 2020-05-12 (×2): qty 1

## 2020-05-12 MED FILL — traMADol HCL 50 MG TABS: 50 | 3 days supply | Qty: 6 | Fill #0

## 2020-05-12 NOTE — Progress Notes (Addendum)
Subjective: Pain is improving.  Diarrhea with PO intake.  Objective: Vital signs in last 24 hours: Temp:  [98 F (36.7 C)-98.7 F (37.1 C)] 98 F (36.7 C) (10/01 0554) Pulse Rate:  [43-46] 43 (10/01 0554) Resp:  [16] 16 (10/01 0554) BP: (154-188)/(67-101) 161/74 (10/01 0554) SpO2:  [99 %-100 %] 100 % (10/01 0554) Weight:  [87.7 kg] 87.7 kg (10/01 0500) Last BM Date: 05/11/20  Intake/Output from previous day: 09/30 0701 - 10/01 0700 In: 3982.3 [P.O.:670; I.V.:3312.3] Out: -  Intake/Output this shift: No intake/output data recorded.  General appearance: alert and no distress GI: decreased tenderness in the epigastrium  Lab Results: Recent Labs    05/10/20 0320 05/11/20 0404 05/12/20 0422  WBC 13.6* 10.6* 9.3  HGB 13.9 12.7 11.9*  HCT 43.1 37.3 35.4*  PLT 163 148* 164   BMET Recent Labs    05/10/20 0320 05/11/20 0749 05/12/20 0422  NA 136 136 141  K 3.5 3.8 3.8  CL 105 109 109  CO2 20* 17* 20*  GLUCOSE 109* 105* 105*  BUN 27* 27* 20  CREATININE 1.67* 1.59* 1.39*  CALCIUM 8.1* 8.1* 8.2*   LFT No results for input(s): PROT, ALBUMIN, AST, ALT, ALKPHOS, BILITOT, BILIDIR, IBILI in the last 72 hours. PT/INR No results for input(s): LABPROT, INR in the last 72 hours. Hepatitis Panel No results for input(s): HEPBSAG, HCVAB, HEPAIGM, HEPBIGM in the last 72 hours. C-Diff No results for input(s): CDIFFTOX in the last 72 hours. Fecal Lactopherrin No results for input(s): FECLLACTOFRN in the last 72 hours.  Studies/Results: US PELVIC COMPLETE WITH TRANSVAGINAL  Result Date: 05/10/2020 CLINICAL DATA:  Left adnexal cyst. EXAM: TRANSABDOMINAL AND TRANSVAGINAL ULTRASOUND OF PELVIS DOPPLER ULTRASOUND OF OVARIES TECHNIQUE: Both transabdominal and transvaginal ultrasound examinations of the pelvis were performed. Transabdominal technique was performed for global imaging of the pelvis including uterus, ovaries, adnexal regions, and pelvic cul-de-sac. It was necessary to  proceed with endovaginal exam following the transabdominal exam to visualize the uterus and bilateral ovaries. Color and duplex Doppler ultrasound was utilized to evaluate blood flow to the ovaries. COMPARISON:  None. FINDINGS: Uterus Measurements: 10.2 cm x 5.0 cm x 4.5 cm = volume: 120 mL. No fibroids or other mass visualized. Endometrium Thickness: 10.9 mm.  No focal abnormality visualized. Right ovary Measurements: 2.3 cm x 1.7 cm x 2.0 cm = volume: 4.0 mL. A 2.0 cm x 1.3 cm x 1.3 cm anechoic structure is seen within the right ovary. No flow is seen within this region on color Doppler evaluation. Left ovary Measurements: 4.0 cm x 2.7 cm x 3.8 cm = volume: 21.3 mL. A 3.6 cm x 2.4 cm x 2.8 cm anechoic structure is seen within the left ovary. No flow is seen within this region on color Doppler evaluation. Pulsed Doppler evaluation of both ovaries demonstrates normal low-resistance arterial and venous waveforms. Other findings No abnormal free fluid. IMPRESSION: Bilateral ovarian cysts. Electronically Signed   By: Aram Candela M.D.   On: 05/10/2020 20:23    Medications:  Scheduled: . cloNIDine  0.2 mg Oral BID  . clopidogrel  75 mg Oral Daily  . enoxaparin (LOVENOX) injection  40 mg Subcutaneous Q24H  . famotidine  40 mg Oral QHS  . hydrALAZINE  25 mg Oral Q8H  . mycophenolate  720 mg Oral BID  . predniSONE  20 mg Oral QPC breakfast  . senna-docusate  2 tablet Oral BID  . tacrolimus  3 mg Oral BID   Continuous:   Assessment/Plan:  1) Acute pancreatitis. 2) Post prandial diarrhea. 3) S/p renal transplant.   Her pain is improving clinically and with the physical examination.  She feels as if she can advance her diet.  It is believed that the Medrol Dose Pack on top of her prednisone induced the acute pancreatitis.  It will be best to avoid this combination in the future as the addition of the Medrol Dose Pack was the only new change in her regimen.  The patient does report some post prandial  diarrhea, which appears mild.  She does not have any diarrhea when she does not eat.  Plan: 1) Continue with pain control. 2) Advance to a regular diet. 3) Monitor the diarrhea complaint. 4) Dr. Christella Hartigan, [facility], will round on the patient this weekend.  LOS: 3 days   [name] D 05/12/2020, 7:07 AM

## 2020-05-12 NOTE — TOC Progression Note (Addendum)
Transition of Care [facility]) - Progression Note    Patient Details  Name: [name] MRN: [id number] Date of Birth: August 03, 1976  [facility] CM/SW Contact  Clearance Coots, LCSW Phone Number: 05/12/2020, 3:03 PM  Clinical Narrative:    Per physician request, CSW verified with the patient she is active with suboxone clinic and has an appointment every to weeks. Patient is active with [facility], [place], [place]. Patient reports she missed her appointment for re-fill on her Zbusolv. The clinic is closed today. The patient will take her hospital discharge paper work to the clinic, in hopes they will fill the prescription. Patient reports she has never missed an appointment and has been sober for six years. She feels confident they will re-fill.  No other needs identified.      Barriers to Discharge: No Barriers Identified  Expected Discharge Plan and Services           Expected Discharge Date: 05/12/20               DME Arranged: N/A         HH Arranged: NA HH Agency: NA         Social Determinants of Health (SDOH) Interventions    Readmission Risk Interventions No flowsheet data found.

## 2020-05-12 NOTE — Progress Notes (Signed)
PROGRESS NOTE  [name] DGU:[id number] DOB: November 20, 1975 DOA: 05/08/2020 PCP: Marva Panda, NP  HPI/Recap of past 24 hours: The patient is a 44 yr old woman who carries a medical history of renal transplant 10 years ago on chronic steroids, prograf, and myfortic. She also takes buprenorphine/naloxone for opiate addiction.  She presented to the ED due to generalized abdominal pain associated with nausea and vomiting x2 days.  Work-up revealed mild acute pancreatitis, also seen on CT scan, presented with a lipase of 146.  TRH was asked to admit.  Hospital course complicated by persistent epigastric pain, nausea with no vomiting despite normalization of lipase level.  05/12/20: Reports abdominal pain is improved.  She has loose stools after she eats.  Likely exacerbated by stool softeners and laxatives which have been discontinued.    Assessment/Plan: Principal Problem:   Pancreatitis Active Problems:   Essential hypertension   KIDNEY TRANSPLANTATION, HX OF   Abdominal pain   Acute pancreatitis  Intractable abdominal pain, improved, suspect multifactorial secondary to resolving acute pancreatitis versus others, need to rule out gastritis, gastropathy or gastroparesis.   Presented with a lipase level greater than 170 Lipase level has normalized, 34 on 05/12/2020. Started on regular diet, monitor Continue pain control GI following, appreciate assistance.  Loose stools She has loose stools after she eats.  Likely exacerbated by stool softeners and laxatives which have been discontinued. No evidence of active infective process Afebrile with no leukocytosis Start PRN Imodium Continue to monitor and encourage solid food intake.  Suspected gastritis, gastropathy or gastroparesis Reports she has been on chronic steroid for more than 10 years Continues to use NSAIDs for pain control at home No prior history of EGD GI consulted and assisting with the  management.  Essential hypertension BP is not at goal, elevated BP may be contributed by pain Continue home clonidine 0.2 mg twice daily Added p.o. hydralazine 10 mg 3 times daily, increase dose to 25 mg 3 times daily. Continue to closely monitor vital signs Pain control  History of renal transplant Continue transplant medications, including prednisone, Prograf, Myfortic Monitor renal function and urine output Continue IV fluid and avoid nephrotoxins  CKD 3B, back to baseline. Currently appears to be back to her baseline creatinine 1.3 with GFR 47. Creatinine peaked at 1.63. Continue to avoid nephrotoxins Continue to monitor renal output Avoid dehydration  Resolved leukocytosis WBC 9.3 on 05/12/2020.  Chronic back pain No acute osseous fiundings on CT scan  Bilateral ovarian cysts Seen on pelvic ultrasound Informed patient and recommended follow-up with GYN outpatient, patient understands and agrees to plan.   DVT Prophylaxis: Lovenox subcu daily CODE STATUS: Full Code Family Communication: None available  Consult: GI, Dr. Elnoria Howard.   Status is: Inpatient    Dispo: The patient is from:Home               Anticipated d/c is ZD:GLOV              Anticipated d/c date is: 05/13/20              Patient currently not stable for dc, ongoing management of diarrhea.       Objective: Vitals:   05/11/20 1707 05/11/20 2146 05/12/20 0500 05/12/20 0554  BP: (!) 167/67 (!) 154/101  (!) 161/74  Pulse: (!) 43 (!) 46  (!) 43  Resp: 16 16  16   Temp: 98.5 F (36.9 C) 98.3 F (36.8 C)  98 F (36.7 C)  TempSrc: Oral Oral  Oral  SpO2: 99% 100%  100%  Weight:   87.7 kg   Height:        Intake/Output Summary (Last 24 hours) at 05/12/2020 1329 Last data filed at 05/12/2020 1216 Gross per 24 hour  Intake 3042.29 ml  Output --  Net 3042.29 ml   Filed Weights   05/08/20 1810 05/12/20 0500  Weight: 82.7 kg 87.7 kg    Exam:  . General: 44 y.o. year-old female  well-developed and nourished no acute stress.  Alert oriented x3.   . Cardiovascular: Regular rate and rhythm no rubs or gallops. Marland Kitchen. Respiratory: Clear auscultation no wheezes no rales.   . Abdomen: Mild epigastric pain with palpation.  Bowel sounds present.   . Musculoskeletal: No lower extremity edema bilaterally.   Marland Kitchen. Psychiatry: Mood is appropriate for condition and setting.   Data Reviewed: CBC: Recent Labs  Lab 05/08/20 1904 05/09/20 0344 05/10/20 0320 05/11/20 0404 05/12/20 0422  WBC 17.4* 23.4* 13.6* 10.6* 9.3  NEUTROABS 15.2*  --   --   --   --   HGB 16.2* 16.9* 13.9 12.7 11.9*  HCT 47.5* 47.8* 43.1 37.3 35.4*  MCV 82.5 80.7 87.2 83.3 83.7  PLT 186 252 163 148* 164   Basic Metabolic Panel: Recent Labs  Lab 05/08/20 1904 05/09/20 0344 05/10/20 0320 05/11/20 0749 05/12/20 0422  NA 138 136 136 136 141  K 3.4* 3.5 3.5 3.8 3.8  CL 107 107 105 109 109  CO2 18* 16* 20* 17* 20*  GLUCOSE 87 76 109* 105* 105*  BUN 22* 20 27* 27* 20  CREATININE 1.59* 1.43* 1.67* 1.59* 1.39*  CALCIUM 8.7* 9.0 8.1* 8.1* 8.2*   GFR: Estimated Creatinine Clearance: 53.6 mL/min (A) (by C-G formula based on SCr of 1.39 mg/dL (H)). Liver Function Tests: Recent Labs  Lab 05/08/20 1904 05/09/20 0344  AST 13* 13*  ALT 18 18  ALKPHOS 100 108  BILITOT 0.4 0.7  PROT 7.1 7.2  ALBUMIN 3.3* 3.4*   Recent Labs  Lab 05/08/20 1904 05/09/20 0351 05/10/20 0320 05/12/20 0422  LIPASE 146* 76* 31 34   No results for input(s): AMMONIA in the last 168 hours. Coagulation Profile: No results for input(s): INR, PROTIME in the last 168 hours. Cardiac Enzymes: No results for input(s): CKTOTAL, CKMB, CKMBINDEX, TROPONINI in the last 168 hours. BNP (last 3 results) No results for input(s): PROBNP in the last 8760 hours. HbA1C: No results for input(s): HGBA1C in the last 72 hours. CBG: No results for input(s): GLUCAP in the last 168 hours. Lipid Profile: No results for input(s): CHOL, HDL,  LDLCALC, TRIG, CHOLHDL, LDLDIRECT in the last 72 hours. Thyroid Function Tests: No results for input(s): TSH, T4TOTAL, FREET4, T3FREE, THYROIDAB in the last 72 hours. Anemia Panel: No results for input(s): VITAMINB12, FOLATE, FERRITIN, TIBC, IRON, RETICCTPCT in the last 72 hours. Urine analysis:    Component Value Date/Time   COLORURINE YELLOW 05/09/2020 0200   APPEARANCEUR CLEAR 05/09/2020 0200   LABSPEC 1.031 (H) 05/09/2020 0200   PHURINE 5.0 05/09/2020 0200   GLUCOSEU NEGATIVE 05/09/2020 0200   HGBUR MODERATE (A) 05/09/2020 0200   BILIRUBINUR NEGATIVE 05/09/2020 0200   KETONESUR NEGATIVE 05/09/2020 0200   PROTEINUR >=300 (A) 05/09/2020 0200   UROBILINOGEN 0.2 10/25/2010 1424   NITRITE NEGATIVE 05/09/2020 0200   LEUKOCYTESUR NEGATIVE 05/09/2020 0200   Sepsis Labs: @LABRCNTIP (procalcitonin:4,lacticidven:4)  ) Recent Results (from the past 240 hour(s))  Respiratory Panel by RT PCR (Flu A&B, Covid) -  Status: None   Collection Time: 05/09/20  2:30 AM   Specimen: Nasopharyngeal  Result Value Ref Range Status   SARS Coronavirus 2 by RT PCR NEGATIVE NEGATIVE Final    Comment: (NOTE) SARS-CoV-2 target nucleic acids are NOT DETECTED.  The SARS-CoV-2 RNA is generally detectable in upper respiratoy specimens during the acute phase of infection. The lowest concentration of SARS-CoV-2 viral copies this assay can detect is 131 copies/mL. A negative result does not preclude SARS-Cov-2 infection and should not be used as the sole basis for treatment or other patient management decisions. A negative result may occur with  improper specimen collection/handling, submission of specimen other than nasopharyngeal swab, presence of viral mutation(s) within the areas targeted by this assay, and inadequate number of viral copies (<131 copies/mL). A negative result must be combined with clinical observations, patient history, and epidemiological information. The expected result is  Negative.  Fact Sheet for Patients:  https://www.moore.com/  Fact Sheet for Healthcare Providers:  https://www.young.biz/  This test is no t yet approved or cleared by the [place] FDA and  has been authorized for detection and/or diagnosis of SARS-CoV-2 by FDA under an Emergency Use Authorization (EUA). This EUA will remain  in effect (meaning this test can be used) for the duration of the COVID-19 declaration under Section 564(b)(1) of the Act, 21 U.S.C. section 360bbb-3(b)(1), unless the authorization is terminated or revoked sooner.     Influenza A by PCR NEGATIVE NEGATIVE Final   Influenza B by PCR NEGATIVE NEGATIVE Final    Comment: (NOTE) The Xpert Xpress SARS-CoV-2/FLU/RSV assay is intended as an aid in  the diagnosis of influenza from Nasopharyngeal swab specimens and  should not be used as a sole basis for treatment. Nasal washings and  aspirates are unacceptable for Xpert Xpress SARS-CoV-2/FLU/RSV  testing.  Fact Sheet for Patients: https://www.moore.com/  Fact Sheet for Healthcare Providers: https://www.young.biz/  This test is not yet approved or cleared by the [place] FDA and  has been authorized for detection and/or diagnosis of SARS-CoV-2 by  FDA under an Emergency Use Authorization (EUA). This EUA will remain  in effect (meaning this test can be used) for the duration of the  Covid-19 declaration under Section 564(b)(1) of the Act, 21  U.S.C. section 360bbb-3(b)(1), unless the authorization is  terminated or revoked. Performed at [facility], [address], [place], [place] [address]   Culture, blood (Routine X 2) w Reflex to ID Panel     Status: None (Preliminary result)   Collection Time: 05/09/20 10:09 AM   Specimen: BLOOD LEFT HAND  Result Value Ref Range Status   Specimen Description   Final    BLOOD LEFT HAND Performed at [facility], [address], [place], [place] [address]    Special Requests   Final    BOTTLES DRAWN AEROBIC AND ANAEROBIC Blood Culture adequate volume Performed at [facility], [address], [place], [place] [address]    Culture   Final    NO GROWTH 3 DAYS Performed at [facility], [address] [place], [place] [address]    Report Status PENDING  Incomplete  Culture, blood (Routine X 2) w Reflex to ID Panel     Status: None (Preliminary result)   Collection Time: 05/09/20 10:22 AM   Specimen: BLOOD LEFT HAND  Result Value Ref Range Status   Specimen Description   Final    BLOOD LEFT HAND Performed at [facility], [address] [place], [place] [address]    Special Requests   Final    BOTTLES DRAWN AEROBIC ONLY Blood Culture adequate volume Performed at [facility], [address] [place], [place] [address]    Culture   Final    NO GROWTH 3 DAYS Performed at [facility], [address] [place], [place] [address]    Report Status PENDING  Incomplete      Studies: No results found.  Scheduled Meds: . cloNIDine  0.2 mg Oral BID  . clopidogrel  75 mg Oral Daily  . enoxaparin (LOVENOX) injection  40 mg Subcutaneous Q24H  . famotidine  40 mg Oral QHS  . hydrALAZINE  25 mg Oral Q8H  . mycophenolate  720 mg Oral BID  . predniSONE  20 mg Oral QPC breakfast  . tacrolimus  3 mg Oral BID    Continuous Infusions:    LOS: 3 days     Darlin Drop, MD [facility] Pager [phone]  If 7PM-7AM, please contact night-coverage [email or web address] Password [email or web address] 05/12/2020, 1:29 PM

## 2020-05-12 NOTE — Discharge Instructions (Signed)
Acute Pancreatitis  Acute pancreatitis happens when the pancreas gets swollen. The pancreas is a large gland in the body that helps to control blood sugar. It also makes enzymes that help to digest food. This condition can last a few days and cause serious problems. The lungs, heart, and kidneys may stop working. What are the causes? Causes include:  Alcohol abuse.  Drug abuse.  Gallstones.  A tumor in the pancreas. Other causes include:  Some medicines.  Some chemicals.  Diabetes.  An infection.  Damage caused by an accident.  The poison (venom) from a scorpion bite.  Belly (abdominal) surgery.  The body's defense system (immune system) attacking the pancreas (autoimmune pancreatitis).  Genes that are passed from parent to child (inherited). In some cases, the cause is not known. What are the signs or symptoms?  Pain in the upper belly that may be felt in the back. The pain may be very bad.  Swelling of the belly.  Feeling sick to your stomach (nauseous) and throwing up (vomiting).  Fever. How is this treated? You will likely have to stay in the hospital. Treatment may include:  Pain medicine.  Fluid through an IV tube.  Placing a tube in the stomach to take out the stomach contents. This may help you stop throwing up.  Not eating for 3-4 days.  Antibiotic medicines, if you have an infection.  Treating any other problems that may be the cause.  Steroid medicines, if your problem is caused by your defense system attacking your body's own tissues.  Surgery. Follow these instructions at home: Eating and drinking   Follow instructions from your doctor about what to eat and drink.  Eat foods that do not have a lot of fat in them.  Eat small meals often. Do not eat big meals.  Drink enough fluid to keep your pee (urine) pale yellow.  Do not drink alcohol if it caused your condition. Medicines  Take over-the-counter and prescription medicines only  as told by your doctor.  Ask your doctor if the medicine prescribed to you: ? Requires you to avoid driving or using heavy machinery. ? Can cause trouble pooping (constipation). You may need to take steps to prevent or treat trouble pooping:  Take over-the-counter or prescription medicines.  Eat foods that are high in fiber. These include beans, whole grains, and fresh fruits and vegetables.  Limit foods that are high in fat and sugar. These include fried or sweet foods. General instructions  Do not use any products that contain nicotine or tobacco, such as cigarettes, e-cigarettes, and chewing tobacco. If you need help quitting, ask your doctor.  Get plenty of rest.  Check your blood sugar at home as told by your doctor.  Keep all follow-up visits as told by your doctor. This is important. Contact a doctor if:  You do not get better as quickly as expected.  You have new symptoms.  Your symptoms get worse.  You have pain or weakness that lasts a long time.  You keep feeling sick to your stomach.  You get better and then you have pain again.  You have a fever. Get help right away if:  You cannot eat or keep fluids down.  Your pain gets very bad.  Your skin or the white part of your eyes turns yellow.  You have sudden swelling in your belly.  You throw up.  You feel dizzy or you pass out (faint).  Your blood sugar is high (over 300   mg/dL). Summary  Acute pancreatitis happens when the pancreas gets swollen.  This condition is often caused by alcohol abuse, drug abuse, or gallstones.  You will likely have to stay in the hospital for treatment. This information is not intended to replace advice given to you by your health care provider. Make sure you discuss any questions you have with your health care provider. Document Revised: 05/18/2018 Document Reviewed: 05/18/2018 [employer] Patient Education  2020 [employer]   Pancreatitis Eating Plan Pancreatitis is  when your pancreas becomes irritated and swollen (inflamed). The pancreas is a small organ located behind your stomach. It helps your body digest food and regulate your blood sugar. Pancreatitis can affect how your body digests food, especially foods with fat. You may also have other symptoms such as abdominal pain or nausea. When you have pancreatitis, following a low-fat eating plan may help you manage symptoms and recover more quickly. Work with your health care provider or a diet and nutrition specialist (dietitian) to create an eating plan that is right for you. What are tips for following this plan? Reading food labels Use the information on food labels to help keep track of how much fat you eat:  Check the serving size.  Look for the amount of total fat in grams (g) in one serving. ? Low-fat foods have 3 g of fat or less per serving. ? Fat-free foods have 0.5 g of fat or less per serving.  Keep track of how much fat you eat based on how many servings you eat. ? For example, if you eat two servings, the amount of fat you eat will be two times what is listed on the label. Shopping   Buy low-fat or nonfat foods, such as: ? Fresh, frozen, or canned fruits and vegetables. ? Grains, including pasta, bread, and rice. ? Lean meat, poultry, fish, and other protein foods. ? Low-fat or nonfat dairy.  Avoid buying bakery products and other sweets made with whole milk, butter, and eggs.  Avoid buying snack foods with added fat, such as anything with butter or cheese flavoring. Cooking  Remove skin from poultry, and remove extra fat from meat.  Limit the amount of fat and oil you use to 6 teaspoons or less per day.  Cook using low-fat methods, such as boiling, broiling, grilling, steaming, or baking.  Use spray oil to cook. Add fat-free chicken broth to add flavor and moisture.  Avoid adding cream to thicken soups or sauces. Use other thickeners such as corn starch or tomato paste. Meal  planning   Eat a low-fat diet as told by your dietitian. For most people, this means having no more than 55-65 grams of fat each day.  Eat small, frequent meals throughout the day. For example, you may have 5-6 small meals instead of 3 large meals.  Drink enough fluid to keep your urine pale yellow.  Do not drink alcohol. Talk to your health care provider if you need help stopping.  Limit how much caffeine you have, including black coffee, black and green tea, caffeinated soft drinks, and energy drinks. General information  Let your health care provider or dietitian know if you have unplanned weight loss on this eating plan.  You may be instructed to follow a clear liquid diet during a flare of symptoms. Talk with your health care provider about how to manage your diet during symptoms of a flare.  Take any vitamins or supplements as told by your health care provider.  Work   with a dietitian, especially if you have other conditions such as obesity or diabetes mellitus. What foods should I avoid? Fruits Fried fruits. Fruits served with butter or cream. Vegetables Fried vegetables. Vegetables cooked with butter, cheese, or cream. Grains Biscuits, waffles, donuts, pastries, and croissants. Pies and cookies. Butter-flavored popcorn. Regular crackers. Meats and other protein foods Fatty cuts of meat. Poultry with skin. Organ meats. Bacon, sausage, and cold cuts. Whole eggs. Nuts and nut butters. Dairy Whole and 2% milk. Whole milk yogurt. Whole milk ice cream. Cream and half-and-half. Cream cheese. Sour cream. Cheese. Beverages Wine, beer, and liquor. The items listed above may not be a complete list of foods and beverages to avoid. Contact a dietitian for more information. Summary  Pancreatitis can affect how your body digests food, especially foods with fat.  When you have pancreatitis, it is recommended that you follow a low-fat eating plan to help you recover more quickly and  manage symptoms. For most people, this means limiting fat to no more than 55-65 grams per day.  Do not drink alcohol. Limit the amount of caffeine you have, and drink enough fluid to keep your urine pale yellow. This information is not intended to replace advice given to you by your health care provider. Make sure you discuss any questions you have with your health care provider. Document Revised: 11/19/2018 Document Reviewed: 11/04/2017 [employer] Patient Education  2020 [employer]  

## 2020-05-12 NOTE — Discharge Summary (Addendum)
Discharge Summary  [name] ZJI:[id number] DOB: 01-Nov-1975  PCP: Marva Panda, NP  Admit date: 05/08/2020 Discharge date: 05/12/2020  Time spent: 35 minutes  Recommendations for Outpatient Follow-up:  1. Follow-up with GI 2. Follow-up with your PCP 3. Please go to your suboxone clinic tomorrow AM 05/13/20 4. Follow-up with gynecology outpatient.  If you do not have 1 please obtain referral from your PCP. 5. Follow-up with your transplant team 6. Take your medications as prescribed 7. Avoid additional use of Medrol Dosepak as recommended by GI.  Discharge Diagnoses:  Active Hospital Problems   Diagnosis Date Noted  . Pancreatitis 05/08/2020  . Abdominal pain 05/09/2020  . Acute pancreatitis 05/09/2020  . Essential hypertension 10/30/2009  . KIDNEY TRANSPLANTATION, HX OF 10/30/2009    Resolved Hospital Problems  No resolved problems to display.    Discharge Condition: Stable  Diet recommendation: Low-fat diet  Vitals:   05/12/20 0554 05/12/20 1417  BP: (!) 161/74 (!) 182/87  Pulse: (!) 43 (!) 55  Resp: 16 18  Temp: 98 F (36.7 C) 98.2 F (36.8 C)  SpO2: 100% 98%    History of present illness:  The patient is a 44 yr old woman who carries a medical history of renal transplant 10 years ago on chronic steroids, prograf, and myfortic. She also takes buprenorphine/naloxone for opiate addiction.  She presented to the ED due to generalized abdominal pain associated with nausea and vomiting x2 days.  Work-up revealed mild acute pancreatitis, also seen on CT scan, presented with a lipase of 146.  TRH was asked to admit.  Hospital course complicated by persistent epigastric pain, nausea with no vomiting despite normalization of lipase level.  05/12/20: Reports abdominal pain is improved.  She has loose stools after she eats.  Likely exacerbated by stool softeners and laxatives which have been discontinued.  She is eager to go home.   Hospital Course:    Principal Problem:   Pancreatitis Active Problems:   Essential hypertension   KIDNEY TRANSPLANTATION, HX OF   Abdominal pain   Acute pancreatitis  Intractable abdominal pain, improved, suspect multifactorial secondary to resolving acute pancreatitis  Presented with a lipase level greater than 170 Lipase level has normalized, 34 on 05/12/2020. Started on regular diet, recommend low-fat diet. Follow up with GI  Loose stools, likely exacerbated by stool softeners/laxative. She has loose stools after she eats.  Likely exacerbated by stool softeners and laxatives which have been discontinued. No evidence of active infective process Afebrile with no leukocytosis Continue to hold off laxatives Encourage solid food intake, low-fat diet. Follow-up with PCP.  Chronic pain syndrome on suboxone Verified with case manager that patient is active with suboxone clinic And that she can return to the clinic tomorrow 05/13/20 to resume care Please present to your suboxone clinic on 05/13/20 AM Tramadol 50 mg BID PRN x 3 days ordered to hold her over the week-end in the event she is not able to refill her suboxone.  The patient states she ran out of her home suboxone.  Essential hypertension BP is not at goal Continue home clonidine 0.2 mg twice daily Added p.o. hydralazine 10 mg 3 times daily, increased dose to 25 mg 3 times daily. Follow-up with your PCP.  History of renal transplant Continue transplant medications, including prednisone, Prograf, Myfortic Avoid nephrotoxins, NSAIDs, and dehydration Follow-up with your transplant team.  CKD 3B, back to baseline. Currently appears to be back to her baseline creatinine 1.3 with GFR 47. Creatinine peaked at  1.67. Continue to avoid nephrotoxins and dehydration. Follow-up with your PCP and your transplant team.  Resolved leukocytosis WBC 9.3K on 05/12/2020. Afebrile. Nontoxic-appearing.  Chronic back pain No acute osseous fiundings on  CT scan Follow-up with your PCP.  Bilateral ovarian cysts Seen on pelvic ultrasound Informed patient and recommended follow-up with GYN outpatient, patient understands and agrees to plan.    CODE STATUS: Full Code   Consult: GI, Dr. Elnoria HowardHung.    Discharge Exam: BP (!) 182/87 (BP Location: Left Arm)   Pulse (!) 55   Temp 98.2 F (36.8 C) (Oral)   Resp 18   Ht 5\' 1"  (1.549 m)   Wt 87.7 kg   SpO2 98%   BMI 36.52 kg/m  . General: 44 y.o. year-old female well developed well nourished in no acute distress.  Alert and oriented x3. . Cardiovascular: Regular rate and rhythm with no rubs or gallops.  No thyromegaly or JVD noted.   Marland Kitchen. Respiratory: Clear to auscultation with no wheezes or rales. Good inspiratory effort. . Abdomen: Soft improved tenderness with palpation of epigastric region.  Bowel sounds present.  . Musculoskeletal: No lower extremity edema. 2/4 pulses in all 4 extremities. Marland Kitchen. Psychiatry: Mood is appropriate for condition and setting  Discharge Instructions You were cared for by a hospitalist during your hospital stay. If you have any questions about your discharge medications or the care you received while you were in the hospital after you are discharged, you can call the unit and asked to speak with the hospitalist on call if the hospitalist that took care of you is not available. Once you are discharged, your primary care physician will handle any further medical issues. Please note that NO REFILLS for any discharge medications will be authorized once you are discharged, as it is imperative that you return to your primary care physician (or establish a relationship with a primary care physician if you do not have one) for your aftercare needs so that they can reassess your need for medications and monitor your lab values.   Allergies as of 05/12/2020      Reactions   Metoclopramide Anxiety, Other (See Comments)   Anxiety   Ciprofloxacin Other (See Comments)    Morphine Other (See Comments)   Other reaction(s): Headache Migraine   Vancomycin    REACTION: gives patient "red man syndrome"      Medication List    TAKE these medications   aspirin 81 MG EC tablet Take 81 mg by mouth daily.   buprenorphine-naloxone 8-2 mg Subl SL tablet Commonly known as: SUBOXONE Place 2 tablets under the tongue daily.   cloNIDine 0.2 MG tablet Commonly known as: CATAPRES Take 0.2 mg by mouth 2 (two) times daily.   clopidogrel 75 MG tablet Commonly known as: PLAVIX Take 75 mg by mouth daily.   famotidine 40 MG tablet Commonly known as: PEPCID Take 40 mg by mouth at bedtime.   hydrALAZINE 25 MG tablet Commonly known as: APRESOLINE Take 1 tablet (25 mg total) by mouth every 8 (eight) hours.   mycophenolate 360 MG Tbec EC tablet Commonly known as: MYFORTIC Take 720 mg by mouth 2 (two) times daily.   predniSONE 10 MG tablet Commonly known as: DELTASONE Take 20 mg by mouth daily.   rosuvastatin 20 MG tablet Commonly known as: CRESTOR Take 20 mg by mouth daily.   tacrolimus 1 MG capsule Commonly known as: PROGRAF Take 3 mg by mouth 2 (two) times daily.   traMADol 50 MG  tablet Commonly known as: ULTRAM Take 1 tablet (50 mg total) by mouth every 12 (twelve) hours as needed for up to 3 days (mild pain).      Allergies  Allergen Reactions  . Metoclopramide Anxiety and Other (See Comments)    Anxiety   . Ciprofloxacin Other (See Comments)  . Morphine Other (See Comments)    Other reaction(s): Headache Migraine   . Vancomycin     REACTION: gives patient "red man syndrome"    Follow-up Information    Marva Panda, NP. Call in 1 day(s).   Why: please call for a post hospital follow up appointment Contact information: [address] [place] [address] [phone]        Jeani Hawking, MD. Call in 1 day(s).   Specialty: Gastroenterology Why: please call for a post hospital follow up appointment Contact  information: [address] [place] [address] [phone]                The results of significant diagnostics from this hospitalization (including imaging, microbiology, ancillary and laboratory) are listed below for reference.    Significant Diagnostic Studies: CT Abdomen Pelvis W Contrast  Result Date: 05/08/2020 CLINICAL DATA:  Generalized abdominal pain x2 days. EXAM: CT ABDOMEN AND PELVIS WITH CONTRAST TECHNIQUE: Multidetector CT imaging of the abdomen and pelvis was performed using the standard protocol following bolus administration of intravenous contrast. CONTRAST:  6mL OMNIPAQUE IOHEXOL 300 MG/ML  SOLN COMPARISON:  March 08, 2014 FINDINGS: Lower chest: No acute abnormality. Hepatobiliary: No focal liver abnormality is seen. Status post cholecystectomy. No biliary dilatation. Pancreas: Mild peripancreatic inflammatory fat stranding is seen. This is seen inferior to the body of the pancreas and extends inferiorly along the midline of the mid abdomen. The parenchyma of the pancreas is normal in appearance. Spleen: Normal in size without focal abnormality. Adrenals/Urinary Tract: Adrenal glands are unremarkable. The native kidneys are atrophic in appearance. A renal transplant is seen within the pelvis on the right. There is no evidence of renal calculi, focal lesions or hydronephrosis. Bladder is unremarkable. Stomach/Bowel: Stomach is within normal limits. The appendix is surgically absent. No evidence of bowel wall thickening, distention, or inflammatory changes. Vascular/Lymphatic: There is mild calcification of the abdominal aorta, without evidence of aneurysmal dilatation or dissection. No enlarged abdominal or pelvic lymph nodes. Reproductive: The uterus is normal in size and appearance. A 3.6 cm x 2.4 cm cyst is seen along the posterior aspect of the left adnexa. Other: A tortuous vessel is seen within the superficial subcutaneous fat along the lateral aspect of  the abdominal and pelvic wall on the right. This extends inferiorly to the level of the right groin and is seen on the prior study. No abdominopelvic ascites. Musculoskeletal: No acute or significant osseous findings. IMPRESSION: 1. Findings consistent with mild acute pancreatitis. Correlation with pancreatic enzymes is recommended. 2. Right pelvic renal transplant. 3. 3.6 cm x 2.4 cm left adnexal cyst, likely ovarian in origin. Correlation with pelvic ultrasound is recommended. 4. Evidence of prior cholecystectomy. Aortic Atherosclerosis (ICD10-I70.0). Electronically Signed   By: Aram Candela M.D.   On: 05/08/2020 21:41   US PELVIC COMPLETE WITH TRANSVAGINAL  Result Date: 05/10/2020 CLINICAL DATA:  Left adnexal cyst. EXAM: TRANSABDOMINAL AND TRANSVAGINAL ULTRASOUND OF PELVIS DOPPLER ULTRASOUND OF OVARIES TECHNIQUE: Both transabdominal and transvaginal ultrasound examinations of the pelvis were performed. Transabdominal technique was performed for global imaging of the pelvis including uterus, ovaries, adnexal regions, and pelvic cul-de-sac. It was necessary  to proceed with endovaginal exam following the transabdominal exam to visualize the uterus and bilateral ovaries. Color and duplex Doppler ultrasound was utilized to evaluate blood flow to the ovaries. COMPARISON:  None. FINDINGS: Uterus Measurements: 10.2 cm x 5.0 cm x 4.5 cm = volume: 120 mL. No fibroids or other mass visualized. Endometrium Thickness: 10.9 mm.  No focal abnormality visualized. Right ovary Measurements: 2.3 cm x 1.7 cm x 2.0 cm = volume: 4.0 mL. A 2.0 cm x 1.3 cm x 1.3 cm anechoic structure is seen within the right ovary. No flow is seen within this region on color Doppler evaluation. Left ovary Measurements: 4.0 cm x 2.7 cm x 3.8 cm = volume: 21.3 mL. A 3.6 cm x 2.4 cm x 2.8 cm anechoic structure is seen within the left ovary. No flow is seen within this region on color Doppler evaluation. Pulsed Doppler evaluation of both ovaries  demonstrates normal low-resistance arterial and venous waveforms. Other findings No abnormal free fluid. IMPRESSION: Bilateral ovarian cysts. Electronically Signed   By: Aram Candela M.D.   On: 05/10/2020 20:23    Microbiology: Recent Results (from the past 240 hour(s))  Respiratory Panel by RT PCR (Flu A&B, Covid) -     Status: None   Collection Time: 05/09/20  2:30 AM   Specimen: Nasopharyngeal  Result Value Ref Range Status   SARS Coronavirus 2 by RT PCR NEGATIVE NEGATIVE Final    Comment: (NOTE) SARS-CoV-2 target nucleic acids are NOT DETECTED.  The SARS-CoV-2 RNA is generally detectable in upper respiratoy specimens during the acute phase of infection. The lowest concentration of SARS-CoV-2 viral copies this assay can detect is 131 copies/mL. A negative result does not preclude SARS-Cov-2 infection and should not be used as the sole basis for treatment or other patient management decisions. A negative result may occur with  improper specimen collection/handling, submission of specimen other than nasopharyngeal swab, presence of viral mutation(s) within the areas targeted by this assay, and inadequate number of viral copies (<131 copies/mL). A negative result must be combined with clinical observations, patient history, and epidemiological information. The expected result is Negative.  Fact Sheet for Patients:  https://www.moore.com/  Fact Sheet for Healthcare Providers:  https://www.young.biz/  This test is no t yet approved or cleared by the [place] FDA and  has been authorized for detection and/or diagnosis of SARS-CoV-2 by FDA under an Emergency Use Authorization (EUA). This EUA will remain  in effect (meaning this test can be used) for the duration of the COVID-19 declaration under Section 564(b)(1) of the Act, 21 U.S.C. section 360bbb-3(b)(1), unless the authorization is terminated or revoked sooner.     Influenza A  by PCR NEGATIVE NEGATIVE Final   Influenza B by PCR NEGATIVE NEGATIVE Final    Comment: (NOTE) The Xpert Xpress SARS-CoV-2/FLU/RSV assay is intended as an aid in  the diagnosis of influenza from Nasopharyngeal swab specimens and  should not be used as a sole basis for treatment. Nasal washings and  aspirates are unacceptable for Xpert Xpress SARS-CoV-2/FLU/RSV  testing.  Fact Sheet for Patients: https://www.moore.com/  Fact Sheet for Healthcare Providers: https://www.young.biz/  This test is not yet approved or cleared by the [place] FDA and  has been authorized for detection and/or diagnosis of SARS-CoV-2 by  FDA under an Emergency Use Authorization (EUA). This EUA will remain  in effect (meaning this test can be used) for the duration of the  Covid-19 declaration under Section 564(b)(1) of the Act, 21  U.S.C.  section 360bbb-3(b)(1), unless the authorization is  terminated or revoked. Performed at [facility], [address] [place], [place] [address]   Culture, blood (Routine X 2) w Reflex to ID Panel     Status: None (Preliminary result)   Collection Time: 05/09/20 10:09 AM   Specimen: BLOOD LEFT HAND  Result Value Ref Range Status   Specimen Description   Final    BLOOD LEFT HAND Performed at [facility], [address], [place], [place] [address]    Special Requests   Final    BOTTLES DRAWN AEROBIC AND ANAEROBIC Blood Culture adequate volume Performed at [facility], [address] [place], [place] [address]    Culture   Final    NO GROWTH 3 DAYS Performed at [facility], [address] [place], [place] [address]    Report Status PENDING  Incomplete  Culture, blood (Routine X 2) w Reflex to ID Panel     Status: None (Preliminary result)   Collection Time: 05/09/20 10:22 AM   Specimen: BLOOD LEFT HAND  Result Value Ref Range Status   Specimen Description    Final    BLOOD LEFT HAND Performed at [facility], [address], [place], [place] [address]    Special Requests   Final    BOTTLES DRAWN AEROBIC ONLY Blood Culture adequate volume Performed at [facility], [address], [place], [place] [address]    Culture   Final    NO GROWTH 3 DAYS Performed at [facility], [address], [place], [place] [address]    Report Status PENDING  Incomplete     Labs: Basic Metabolic Panel: Recent Labs  Lab 05/08/20 1904 05/09/20 0344 05/10/20 0320 05/11/20 0749 05/12/20 0422  NA 138 136 136 136 141  K 3.4* 3.5 3.5 3.8 3.8  CL 107 107 105 109 109  CO2 18* 16* 20* 17* 20*  GLUCOSE 87 76 109* 105* 105*  BUN 22* 20 27* 27* 20  CREATININE 1.59* 1.43* 1.67* 1.59* 1.39*  CALCIUM 8.7* 9.0 8.1* 8.1* 8.2*   Liver Function Tests: Recent Labs  Lab 05/08/20 1904 05/09/20 0344  AST 13* 13*  ALT 18 18  ALKPHOS 100 108  BILITOT 0.4 0.7  PROT 7.1 7.2  ALBUMIN 3.3* 3.4*   Recent Labs  Lab 05/08/20 1904 05/09/20 0351 05/10/20 0320 05/12/20 0422  LIPASE 146* 76* 31 34   No results for input(s): AMMONIA in the last 168 hours. CBC: Recent Labs  Lab 05/08/20 1904 05/09/20 0344 05/10/20 0320 05/11/20 0404 05/12/20 0422  WBC 17.4* 23.4* 13.6* 10.6* 9.3  NEUTROABS 15.2*  --   --   --   --   HGB 16.2* 16.9* 13.9 12.7 11.9*  HCT 47.5* 47.8* 43.1 37.3 35.4*  MCV 82.5 80.7 87.2 83.3 83.7  PLT 186 252 163 148* 164   Cardiac Enzymes: No results for input(s): CKTOTAL, CKMB, CKMBINDEX, TROPONINI in the last 168 hours. BNP: BNP (last 3 results) No results for input(s): BNP in the last 8760 hours.  ProBNP (last 3 results) No results for input(s): PROBNP in the last 8760 hours.  CBG: No results for input(s): GLUCAP in the last 168 hours.     Signed:  Darlin Drop, MD [facility] 05/12/2020, 3:47 PM

## 2020-05-14 LAB — CULTURE, BLOOD (ROUTINE X 2)
Culture: NO GROWTH
Culture: NO GROWTH
Special Requests: ADEQUATE
Special Requests: ADEQUATE

## 2021-02-14 ENCOUNTER — Other Ambulatory Visit: Payer: Self-pay | Admitting: Nephrology

## 2021-02-19 ENCOUNTER — Other Ambulatory Visit: Payer: Self-pay | Admitting: Nephrology

## 2021-02-19 DIAGNOSIS — Z940 Kidney transplant status: Secondary | ICD-10-CM

## 2021-02-19 DIAGNOSIS — I129 Hypertensive chronic kidney disease with stage 1 through stage 4 chronic kidney disease, or unspecified chronic kidney disease: Secondary | ICD-10-CM

## 2021-02-19 DIAGNOSIS — R2242 Localized swelling, mass and lump, left lower limb: Secondary | ICD-10-CM

## 2021-03-01 ENCOUNTER — Other Ambulatory Visit: Payer: Self-pay | Admitting: Nephrology

## 2021-03-01 ENCOUNTER — Other Ambulatory Visit: Payer: Medicare Other

## 2021-03-01 ENCOUNTER — Ambulatory Visit
Admission: RE | Admit: 2021-03-01 | Discharge: 2021-03-01 | Disposition: A | Discharge status: Home / Self Care | Payer: Medicare Other | Source: Ambulatory Visit | Attending: Nephrology | Admitting: Nephrology

## 2021-03-01 DIAGNOSIS — I129 Hypertensive chronic kidney disease with stage 1 through stage 4 chronic kidney disease, or unspecified chronic kidney disease: Secondary | ICD-10-CM

## 2021-03-01 DIAGNOSIS — Z940 Kidney transplant status: Secondary | ICD-10-CM

## 2021-03-01 DIAGNOSIS — R2242 Localized swelling, mass and lump, left lower limb: Secondary | ICD-10-CM

## 2022-02-15 ENCOUNTER — Other Ambulatory Visit: Payer: Self-pay | Admitting: Nephrology

## 2022-02-15 ENCOUNTER — Other Ambulatory Visit (HOSPITAL_COMMUNITY): Payer: Self-pay | Admitting: Nephrology

## 2022-02-15 DIAGNOSIS — Z940 Kidney transplant status: Secondary | ICD-10-CM

## 2022-03-05 ENCOUNTER — Ambulatory Visit (HOSPITAL_COMMUNITY)
Admission: RE | Admit: 2022-03-05 | Discharge: 2022-03-05 | Disposition: A | Discharge status: Home / Self Care | Payer: Medicare Other | Source: Ambulatory Visit | Attending: Nephrology | Admitting: Nephrology

## 2022-03-05 ENCOUNTER — Other Ambulatory Visit: Payer: Self-pay | Admitting: Radiology

## 2022-03-05 ENCOUNTER — Encounter (HOSPITAL_COMMUNITY): Payer: Self-pay

## 2022-03-05 DIAGNOSIS — N189 Chronic kidney disease, unspecified: Secondary | ICD-10-CM

## 2022-08-29 ENCOUNTER — Ambulatory Visit (INDEPENDENT_AMBULATORY_CARE_PROVIDER_SITE_OTHER): Payer: Medicare Other | Admitting: Student in an Organized Health Care Education/Training Program

## 2022-08-29 ENCOUNTER — Encounter: Payer: Self-pay | Admitting: Student in an Organized Health Care Education/Training Program

## 2022-08-29 ENCOUNTER — Ambulatory Visit
Admission: RE | Admit: 2022-08-29 | Discharge: 2022-08-29 | Disposition: A | Discharge status: Home / Self Care | Payer: Medicare Other | Attending: Student in an Organized Health Care Education/Training Program | Admitting: Student in an Organized Health Care Education/Training Program

## 2022-08-29 ENCOUNTER — Ambulatory Visit
Admission: RE | Admit: 2022-08-29 | Discharge: 2022-08-29 | Disposition: A | Discharge status: Home / Self Care | Payer: Medicare Other | Source: Ambulatory Visit | Attending: Student in an Organized Health Care Education/Training Program | Admitting: Student in an Organized Health Care Education/Training Program

## 2022-08-29 VITALS — BP 128/74 | HR 67 | Temp 98.3°F | Ht 61.5 in | Wt 200.2 lb

## 2022-08-29 DIAGNOSIS — Z9189 Other specified personal risk factors, not elsewhere classified: Secondary | ICD-10-CM | POA: Diagnosis not present

## 2022-08-29 DIAGNOSIS — R0602 Shortness of breath: Secondary | ICD-10-CM

## 2022-08-29 NOTE — Patient Instructions (Signed)
Today, I ordered a breathing test (pulmonary function test), an echocardiogram of your heart, a chest xray, and a home sleep study. This is all to evaluate your shortness of breath.

## 2022-08-29 NOTE — Progress Notes (Signed)
Synopsis: Referred in for shortness of breath by Madelon Lips, MD  Assessment & Plan:   1. Shortness of breath 2. At risk for obstructive sleep apnea  Patient is presenting for the evaluation the exertional dyspnea as well as episodes whereby she wakes up breathless from sleep. I'm mostly concerned about sleep apnea as a driver behind the patient's symptoms, especially that they have worsened as she gained weight. I will be sending a home sleep test for her to initiate the workup. Furthermore, I will obtain a pulmonary function test (spirometry, lung volumes, DLCO) to further assess her dyspnea. I will also obtain a TTE given the patient's report of previous SVC syndrome that was surgically fixed (with patient reporting a pericardial effusion as a complication).  - DG Chest 2 View; Future - ECHOCARDIOGRAM COMPLETE; Future - Pulmonary Function Test [facility] Only; Future - Home sleep test; Future  Return in about 2 months (around 10/28/2022).  I spent 50 minutes caring for this patient today, including preparing to see the patient, obtaining a medical history , reviewing a separately obtained history, performing a medically appropriate examination and/or evaluation, counseling and educating the patient/family/caregiver, ordering medications, tests, or procedures, and documenting clinical information in the electronic health record  Armando Reichert, MD [facility] 08/29/2022 3:39 PM    End of visit medications:  No orders of the defined types were placed in this encounter.    Current Outpatient Medications:    aspirin 81 MG EC tablet, Take 81 mg by mouth daily. , Disp: , Rfl:    Buprenorphine HCl-Naloxone HCl (ZUBSOLV) 8.6-2.1 MG SUBL, Place 0.5-1 tablets under the tongue See admin instructions. Take 1 tablet, 1 tablet at midday, 0.5 tablet in the evening, Disp: , Rfl:    cloNIDine (CATAPRES) 0.2 MG tablet, Take 0.2 mg by mouth 3 (three) times daily., Disp: , Rfl:     clopidogrel (PLAVIX) 75 MG tablet, Take 75 mg by mouth daily., Disp: , Rfl:    famotidine (PEPCID) 40 MG tablet, Take 40 mg by mouth at bedtime., Disp: , Rfl:    furosemide (LASIX) 80 MG tablet, Take 80 mg by mouth daily as needed for edema., Disp: , Rfl:    mycophenolate (MYFORTIC) 360 MG TBEC, Take 720 mg by mouth 2 (two) times daily.  , Disp: , Rfl:    rosuvastatin (CRESTOR) 20 MG tablet, Take 20 mg by mouth daily., Disp: , Rfl:    tacrolimus (PROGRAF) 1 MG capsule, Take 3 mg by mouth 2 (two) times daily.  , Disp: , Rfl:    torsemide (DEMADEX) 20 MG tablet, Take 20 mg by mouth daily., Disp: , Rfl:    VENTOLIN HFA 108 (90 Base) MCG/ACT inhaler, 2 puffs every 6 (six) hours as needed., Disp: , Rfl:    zolpidem (AMBIEN) 10 MG tablet, Take 10 mg by mouth at bedtime., Disp: , Rfl:    losartan (COZAAR) 50 MG tablet, Take 50 mg by mouth daily. (Patient not taking: Reported on 08/29/2022), Disp: , Rfl:    Subjective:   PATIENT ID: [name] GENDER: female DOB: 22-Jan-1976, MRN: [id number]  Chief Complaint  Patient presents with   pulmonary consult    SOB with laying flat and non prod cough.     HPI  Ms. Leffel is a pleasant 47 year old female with a past medical history of end-stage renal disease status post kidney transplant who presents to clinic for the evaluation of shortness of breath.  Patient reports that this symptom  most bothersome to her is waking up breathless from sleep.  She reports feeling okay but then after she falls asleep she wakes up gasping for air and it takes her between 20 and 30 minutes to feel improved.  This happens at least once a night, and has been happening with increased frequency over the past 6 months to the point where it is happening nightly.  Patient and her wife both report exertional dyspnea especially walking long distances.  She has to take a break walking from the parking lot to our office to catch her breath.  She denies any chest pain, chest  tightness, cough, sputum production, wheezing, pleurisy, night sweats, or weight loss.  Patient actually reports some weight gain over the past many months. Patient's wife reports that she does snore heavily, and has instances whereby she stops breathing. She would hear her snoring and then she would suddenly stop.  She is also a smoker and smokes 1 pack a day. She works from home. They do not have any pets. They live in a house with both carpet and hardwood and she doesn't notice a difference in her symptoms between the two.  Ancillary information including prior medications, full medical/surgical/family/social histories, and PFTs (when available) are listed below and have been reviewed.  Review of Systems  Constitutional:  Negative for chills, fever and weight loss (weight gain over the past year).  Respiratory:  Positive for shortness of breath. Negative for cough, hemoptysis, sputum production and wheezing.   Cardiovascular:  Positive for leg swelling. Negative for chest pain, palpitations and orthopnea.     Objective:   Vitals:   08/29/22 1514  BP: 128/74  Pulse: 67  Temp: 98.3 F (36.8 C)  TempSrc: Temporal  SpO2: 96%  Weight: 200 lb 3.2 oz (90.8 kg)  Height: 5' 1.5" (1.562 m)   96% on RA  BMI Readings from Last 3 Encounters:  08/29/22 37.21 kg/m  05/12/20 36.52 kg/m  12/18/16 38.55 kg/m   Wt Readings from Last 3 Encounters:  08/29/22 200 lb 3.2 oz (90.8 kg)  05/12/20 193 lb 4.8 oz (87.7 kg)  12/18/16 204 lb (92.5 kg)    Physical Exam Constitutional:      General: She is not in acute distress.    Appearance: She is obese. She is not ill-appearing.  HENT:     Head: Normocephalic.     Mouth/Throat:     Mouth: Mucous membranes are moist.  Cardiovascular:     Rate and Rhythm: Normal rate and regular rhythm.     Heart sounds: Normal heart sounds.  Pulmonary:     Effort: Pulmonary effort is normal.     Breath sounds: Normal breath sounds.  Abdominal:      General: There is distension.     Palpations: Abdomen is soft.  Neurological:     General: No focal deficit present.     Mental Status: She is alert and oriented to person, place, and time. Mental status is at baseline.     Ancillary Information    Past Medical History:  Diagnosis Date   Anemia    Anxiety    GERD (gastroesophageal reflux disease)    Hyperlipidemia    Hypertension      Family History  Problem Relation Age of Onset   Cancer Father    Hyperlipidemia Maternal Uncle    Pulmonary fibrosis Maternal Grandmother    Colon cancer Maternal Grandmother      Past Surgical History:  Procedure Laterality Date  APPENDECTOMY     CESAREAN SECTION     CHOLECYSTECTOMY     KIDNEY TRANSPLANT Right    TUBAL LIGATION     vein ligation     VEIN LIGATION      Social History   Socioeconomic History   Marital status: Significant Other    Spouse name: Not on file   Number of children: Not on file   Years of education: Not on file   Highest education level: Not on file  Occupational History   Not on file  Tobacco Use   Smoking status: Every Day    Packs/day: 1.00    Years: 30.00    Total pack years: 30.00    Types: Cigarettes   Smokeless tobacco: Never  Substance and Sexual Activity   Alcohol use: No   Drug use: No   Sexual activity: Not on file  Other Topics Concern   Not on file  Social History Narrative   Not on file   Social Determinants of Health   Financial Resource Strain: Not on file  Food Insecurity: Not on file  Transportation Needs: Not on file  Physical Activity: Not on file  Stress: Not on file  Social Connections: Not on file  Intimate Partner Violence: Not on file     Allergies  Allergen Reactions   Metoclopramide Anxiety and Other (See Comments)   Ciprofloxacin Nausea Only   Morphine Other (See Comments)    Headache /migraine    Vancomycin     gives patient "red man syndrome"     CBC    Component Value Date/Time   WBC 9.3  05/12/2020 0422   RBC 4.23 05/12/2020 0422   HGB 11.9 (L) 05/12/2020 0422   HCT 35.4 (L) 05/12/2020 0422   PLT 164 05/12/2020 0422   MCV 83.7 05/12/2020 0422   MCH 28.1 05/12/2020 0422   MCHC 33.6 05/12/2020 0422   RDW 13.5 05/12/2020 0422   LYMPHSABS 0.9 05/08/2020 1904   MONOABS 1.0 05/08/2020 1904   EOSABS 0.1 05/08/2020 1904   BASOSABS 0.0 05/08/2020 1904    Pulmonary Functions Testing Results:     No data to display          Outpatient Medications Prior to Visit  Medication Sig Dispense Refill   aspirin 81 MG EC tablet Take 81 mg by mouth daily.      Buprenorphine HCl-Naloxone HCl (ZUBSOLV) 8.6-2.1 MG SUBL Place 0.5-1 tablets under the tongue See admin instructions. Take 1 tablet, 1 tablet at midday, 0.5 tablet in the evening     cloNIDine (CATAPRES) 0.2 MG tablet Take 0.2 mg by mouth 3 (three) times daily.     clopidogrel (PLAVIX) 75 MG tablet Take 75 mg by mouth daily.     famotidine (PEPCID) 40 MG tablet Take 40 mg by mouth at bedtime.     furosemide (LASIX) 80 MG tablet Take 80 mg by mouth daily as needed for edema.     mycophenolate (MYFORTIC) 360 MG TBEC Take 720 mg by mouth 2 (two) times daily.       rosuvastatin (CRESTOR) 20 MG tablet Take 20 mg by mouth daily.     tacrolimus (PROGRAF) 1 MG capsule Take 3 mg by mouth 2 (two) times daily.       torsemide (DEMADEX) 20 MG tablet Take 20 mg by mouth daily.     VENTOLIN HFA 108 (90 Base) MCG/ACT inhaler 2 puffs every 6 (six) hours as needed.     zolpidem (AMBIEN) 10  MG tablet Take 10 mg by mouth at bedtime.     predniSONE (DELTASONE) 5 MG tablet Take 5 mg by mouth 2 (two) times daily.     losartan (COZAAR) 50 MG tablet Take 50 mg by mouth daily. (Patient not taking: Reported on 08/29/2022)     No facility-administered medications prior to visit.

## 2022-10-03 ENCOUNTER — Ambulatory Visit
Admission: RE | Admit: 2022-10-03 | Discharge: 2022-10-03 | Disposition: A | Discharge status: Home / Self Care | Payer: Medicare Other | Source: Ambulatory Visit | Attending: Student in an Organized Health Care Education/Training Program | Admitting: Student in an Organized Health Care Education/Training Program

## 2022-10-03 DIAGNOSIS — I080 Rheumatic disorders of both mitral and aortic valves: Secondary | ICD-10-CM | POA: Diagnosis not present

## 2022-10-03 DIAGNOSIS — I10 Essential (primary) hypertension: Secondary | ICD-10-CM | POA: Insufficient documentation

## 2022-10-03 DIAGNOSIS — E785 Hyperlipidemia, unspecified: Secondary | ICD-10-CM | POA: Diagnosis not present

## 2022-10-03 DIAGNOSIS — R0602 Shortness of breath: Secondary | ICD-10-CM | POA: Diagnosis present

## 2022-10-03 DIAGNOSIS — R0600 Dyspnea, unspecified: Secondary | ICD-10-CM | POA: Insufficient documentation

## 2022-10-03 LAB — ECHOCARDIOGRAM COMPLETE
AR max vel: 2.51 cm2
AV Area VTI: 2.73 cm2
AV Area mean vel: 2.26 cm2
AV Mean grad: 5 mmHg
AV Peak grad: 8.3 mmHg
Ao pk vel: 1.44 m/s
Area-P 1/2: 3.08 cm2
P 1/2 time: 528 msec
S' Lateral: 3.2 cm

## 2022-10-03 NOTE — Progress Notes (Signed)
*  PRELIMINARY RESULTS* Echocardiogram 2D Echocardiogram has been performed.  [name] 10/03/2022, 10:48 AM

## 2022-10-22 ENCOUNTER — Telehealth: Payer: Self-pay | Admitting: Student in an Organized Health Care Education/Training Program

## 2022-10-22 NOTE — Telephone Encounter (Signed)
I called to schedule [name] to pick up the HST machine. She stated that she hadn't heard anything about her test results already done

## 2022-10-22 NOTE — Telephone Encounter (Signed)
Spoke to patient.  She is requesting CXR and echo results.  Dr. Genia Harold, please advise. Thanks

## 2022-10-23 NOTE — Telephone Encounter (Signed)
Chest xray is normal with no sign of any acute illness. Heart echo is showing normal function of the heart with normal valves. There's a sign that the heart isn't relaxing well, which can happen in the setting of high blood pressure and sleep apnea. I don't suspect that it's contributing to symptoms at the moment, but we could discuss this on follow up. Please call the patient to let her know. Thank you.

## 2022-10-23 NOTE — Telephone Encounter (Signed)
Patient is aware of results and voiced her understanding.  Nothing further needed.   

## 2022-11-01 ENCOUNTER — Ambulatory Visit: Payer: Medicare Other

## 2022-11-01 DIAGNOSIS — G4733 Obstructive sleep apnea (adult) (pediatric): Secondary | ICD-10-CM | POA: Diagnosis not present

## 2022-11-01 DIAGNOSIS — Z9189 Other specified personal risk factors, not elsewhere classified: Secondary | ICD-10-CM

## 2022-11-01 DIAGNOSIS — R0602 Shortness of breath: Secondary | ICD-10-CM

## 2022-11-11 DIAGNOSIS — G4733 Obstructive sleep apnea (adult) (pediatric): Secondary | ICD-10-CM | POA: Diagnosis not present

## 2022-12-03 ENCOUNTER — Telehealth: Payer: Self-pay

## 2022-12-03 ENCOUNTER — Other Ambulatory Visit: Payer: Self-pay | Admitting: Student in an Organized Health Care Education/Training Program

## 2022-12-03 DIAGNOSIS — G4733 Obstructive sleep apnea (adult) (pediatric): Secondary | ICD-10-CM

## 2022-12-03 NOTE — Progress Notes (Signed)
Home sleep study is consistent with OSA. Will initiate CPAP.

## 2022-12-03 NOTE — Telephone Encounter (Signed)
Received below message from Dr. Aundria Rud. Unable to document within encounter.   Needs CPAP for OSA   Dgayli, Khabib, MD10 minutes ago (4:48 PM)    Home sleep study is consistent with OSA. Will initiate CPAP.      Spoke to patient and relayed results/recommendations. She voiced her understanding.  She stated that she is currently in [facility] handling a family emergency. She will contact our office when back in [place] to start treatment.   Routing to Dr. Aundria Rud and Synetta Fail as an Lorain Childes.

## 2024-03-18 ENCOUNTER — Telehealth (HOSPITAL_COMMUNITY): Payer: Self-pay | Admitting: Pharmacy Technician

## 2024-03-18 NOTE — Telephone Encounter (Signed)
 Auth Submission: NO AUTH NEEDED Site of care: [facility] Payer: Medicare A/B, [place] Medicaid Medication & CPT/J Code(s) submitted: Feraheme  (ferumoxytol ) [id number] Diagnosis Code: D63.1 Route of submission (phone, fax, portal):  Phone # Fax # Auth type: Buy/Bill HB Units/visits requested: 510mg  x 2 doses Reference number:  Approval from: 03/18/24 to 08/11/24    Dagoberto Armour, CPhT [facility] Phone: [phone] 03/18/2024

## 2024-03-25 ENCOUNTER — Other Ambulatory Visit (HOSPITAL_COMMUNITY): Payer: Self-pay | Admitting: *Deleted

## 2024-03-26 ENCOUNTER — Encounter (HOSPITAL_COMMUNITY)

## 2024-04-02 ENCOUNTER — Encounter (HOSPITAL_COMMUNITY)

## 2024-06-22 ENCOUNTER — Other Ambulatory Visit (HOSPITAL_COMMUNITY): Payer: Self-pay | Admitting: Nephrology

## 2024-06-22 DIAGNOSIS — D631 Anemia in chronic kidney disease: Secondary | ICD-10-CM | POA: Insufficient documentation

## 2024-09-08 ENCOUNTER — Ambulatory Visit: Attending: Vascular Surgery | Admitting: Vascular Surgery

## 2024-09-08 ENCOUNTER — Emergency Department (HOSPITAL_COMMUNITY)

## 2024-09-08 ENCOUNTER — Inpatient Hospital Stay (HOSPITAL_COMMUNITY)
Admission: EM | Admit: 2024-09-08 | Discharge: 2024-09-17 | Disposition: A | Source: Home / Self Care | Attending: Family Medicine | Admitting: Family Medicine

## 2024-09-08 ENCOUNTER — Inpatient Hospital Stay (HOSPITAL_COMMUNITY)

## 2024-09-08 DIAGNOSIS — Z3201 Encounter for pregnancy test, result positive: Secondary | ICD-10-CM

## 2024-09-08 DIAGNOSIS — R410 Disorientation, unspecified: Secondary | ICD-10-CM | POA: Insufficient documentation

## 2024-09-08 DIAGNOSIS — I2510 Atherosclerotic heart disease of native coronary artery without angina pectoris: Secondary | ICD-10-CM

## 2024-09-08 DIAGNOSIS — L259 Unspecified contact dermatitis, unspecified cause: Secondary | ICD-10-CM | POA: Insufficient documentation

## 2024-09-08 DIAGNOSIS — Z940 Kidney transplant status: Secondary | ICD-10-CM

## 2024-09-08 DIAGNOSIS — K5900 Constipation, unspecified: Secondary | ICD-10-CM | POA: Insufficient documentation

## 2024-09-08 DIAGNOSIS — N185 Chronic kidney disease, stage 5: Secondary | ICD-10-CM

## 2024-09-08 DIAGNOSIS — T8612 Kidney transplant failure: Secondary | ICD-10-CM | POA: Insufficient documentation

## 2024-09-08 DIAGNOSIS — L8990 Pressure ulcer of unspecified site, unspecified stage: Secondary | ICD-10-CM | POA: Insufficient documentation

## 2024-09-08 DIAGNOSIS — N186 End stage renal disease: Secondary | ICD-10-CM | POA: Insufficient documentation

## 2024-09-08 DIAGNOSIS — E8770 Fluid overload, unspecified: Secondary | ICD-10-CM | POA: Insufficient documentation

## 2024-09-08 DIAGNOSIS — M79671 Pain in right foot: Secondary | ICD-10-CM

## 2024-09-08 DIAGNOSIS — I509 Heart failure, unspecified: Secondary | ICD-10-CM

## 2024-09-08 DIAGNOSIS — Z789 Other specified health status: Secondary | ICD-10-CM

## 2024-09-08 DIAGNOSIS — I5021 Acute systolic (congestive) heart failure: Secondary | ICD-10-CM | POA: Insufficient documentation

## 2024-09-08 DIAGNOSIS — D509 Iron deficiency anemia, unspecified: Secondary | ICD-10-CM | POA: Insufficient documentation

## 2024-09-08 DIAGNOSIS — I5041 Acute combined systolic (congestive) and diastolic (congestive) heart failure: Secondary | ICD-10-CM

## 2024-09-08 DIAGNOSIS — I161 Hypertensive emergency: Secondary | ICD-10-CM

## 2024-09-08 DIAGNOSIS — I4891 Unspecified atrial fibrillation: Secondary | ICD-10-CM | POA: Insufficient documentation

## 2024-09-08 DIAGNOSIS — I480 Paroxysmal atrial fibrillation: Secondary | ICD-10-CM | POA: Insufficient documentation

## 2024-09-08 DIAGNOSIS — J9601 Acute respiratory failure with hypoxia: Principal | ICD-10-CM

## 2024-09-08 DIAGNOSIS — N049 Nephrotic syndrome with unspecified morphologic changes: Secondary | ICD-10-CM

## 2024-09-08 LAB — CBC WITH DIFFERENTIAL/PLATELET
Abs Immature Granulocytes: 0.05 10*3/uL (ref 0.00–0.07)
Basophils Absolute: 0 10*3/uL (ref 0.0–0.1)
Basophils Relative: 0 %
Eosinophils Absolute: 0 10*3/uL (ref 0.0–0.5)
Eosinophils Relative: 0 %
HCT: 25.7 % — ABNORMAL LOW (ref 36.0–46.0)
Hemoglobin: 7.5 g/dL — ABNORMAL LOW (ref 12.0–15.0)
Immature Granulocytes: 1 %
Lymphocytes Relative: 9 %
Lymphs Abs: 0.9 10*3/uL (ref 0.7–4.0)
MCH: 23.1 pg — ABNORMAL LOW (ref 26.0–34.0)
MCHC: 29.2 g/dL — ABNORMAL LOW (ref 30.0–36.0)
MCV: 79.1 fL — ABNORMAL LOW (ref 80.0–100.0)
Monocytes Absolute: 0.8 10*3/uL (ref 0.1–1.0)
Monocytes Relative: 8 %
Neutro Abs: 8.6 10*3/uL — ABNORMAL HIGH (ref 1.7–7.7)
Neutrophils Relative %: 82 %
Platelets: 343 10*3/uL (ref 150–400)
RBC: 3.25 MIL/uL — ABNORMAL LOW (ref 3.87–5.11)
RDW: 17 % — ABNORMAL HIGH (ref 11.5–15.5)
WBC: 10.3 10*3/uL (ref 4.0–10.5)
nRBC: 0.3 % — ABNORMAL HIGH (ref 0.0–0.2)

## 2024-09-08 LAB — I-STAT CHEM 8, ED
BUN: 103 mg/dL — ABNORMAL HIGH (ref 6–20)
Calcium, Ion: 1.11 mmol/L — ABNORMAL LOW (ref 1.15–1.40)
Chloride: 103 mmol/L (ref 98–111)
Creatinine, Ser: 7.4 mg/dL — ABNORMAL HIGH (ref 0.44–1.00)
Glucose, Bld: 95 mg/dL (ref 70–99)
HCT: 24 % — ABNORMAL LOW (ref 36.0–46.0)
Hemoglobin: 8.2 g/dL — ABNORMAL LOW (ref 12.0–15.0)
Potassium: 4.8 mmol/L (ref 3.5–5.1)
Sodium: 131 mmol/L — ABNORMAL LOW (ref 135–145)
TCO2: 16 mmol/L — ABNORMAL LOW (ref 22–32)

## 2024-09-08 LAB — TSH: TSH: 2.53 u[IU]/mL (ref 0.350–4.500)

## 2024-09-08 LAB — HCG, QUANTITATIVE, PREGNANCY: hCG, Beta Chain, Quant, S: 27 m[IU]/mL — ABNORMAL HIGH

## 2024-09-08 LAB — BASIC METABOLIC PANEL WITH GFR
Anion gap: 18 — ABNORMAL HIGH (ref 5–15)
BUN: 96 mg/dL — ABNORMAL HIGH (ref 6–20)
CO2: 15 mmol/L — ABNORMAL LOW (ref 22–32)
Calcium: 8 mg/dL — ABNORMAL LOW (ref 8.9–10.3)
Chloride: 99 mmol/L (ref 98–111)
Creatinine, Ser: 6.87 mg/dL — ABNORMAL HIGH (ref 0.44–1.00)
GFR, Estimated: 7 mL/min — ABNORMAL LOW
Glucose, Bld: 96 mg/dL (ref 70–99)
Potassium: 4.9 mmol/L (ref 3.5–5.1)
Sodium: 131 mmol/L — ABNORMAL LOW (ref 135–145)

## 2024-09-08 LAB — PRO BRAIN NATRIURETIC PEPTIDE: Pro Brain Natriuretic Peptide: >35000 pg/mL — ABNORMAL HIGH

## 2024-09-08 LAB — HCG, SERUM, QUALITATIVE: Preg, Serum: POSITIVE — AB

## 2024-09-08 LAB — TROPONIN T, HIGH SENSITIVITY
Troponin T High Sensitivity: 231 ng/L (ref 0–19)
Troponin T High Sensitivity: 232 ng/L (ref 0–19)

## 2024-09-08 MED ORDER — ACETAMINOPHEN 325 MG PO TABS
650.0000 mg | ORAL_TABLET | Freq: Four times a day (QID) | ORAL | Status: DC | PRN
  Administered 2024-09-09: 650 mg via ORAL
  Filled 2024-09-08: qty 2

## 2024-09-08 MED ORDER — NITROGLYCERIN IN D5W 200-5 MCG/ML-% IV SOLN
0.0000 ug/min | INTRAVENOUS | Status: DC
  Administered 2024-09-08: 10 ug/min via INTRAVENOUS
  Filled 2024-09-08: qty 250

## 2024-09-08 MED ORDER — HEPARIN SODIUM (PORCINE) 5000 UNIT/ML IJ SOLN
5000.0000 [IU] | Freq: Once | INTRAMUSCULAR | Status: DC
  Filled 2024-09-08: qty 1

## 2024-09-08 MED ORDER — TACROLIMUS 1 MG PO CAPS
3.0000 mg | ORAL_CAPSULE | Freq: Two times a day (BID) | ORAL | Status: DC
  Administered 2024-09-09 – 2024-09-14 (×11): 3 mg via ORAL
  Filled 2024-09-08 (×13): qty 3

## 2024-09-08 MED ORDER — MYCOPHENOLATE SODIUM 180 MG PO TBEC
720.0000 mg | DELAYED_RELEASE_TABLET | Freq: Two times a day (BID) | ORAL | Status: DC
  Administered 2024-09-09 – 2024-09-13 (×9): 720 mg via ORAL
  Filled 2024-09-08 (×12): qty 4

## 2024-09-08 MED ORDER — DILTIAZEM HCL-DEXTROSE 125-5 MG/125ML-% IV SOLN (PREMIX)
5.0000 mg/h | INTRAVENOUS | Status: DC
  Filled 2024-09-08: qty 125

## 2024-09-08 MED ORDER — FUROSEMIDE 10 MG/ML IJ SOLN
40.0000 mg | Freq: Once | INTRAMUSCULAR | Status: AC
  Administered 2024-09-08: 40 mg via INTRAVENOUS
  Filled 2024-09-08: qty 4

## 2024-09-08 MED ORDER — IPRATROPIUM-ALBUTEROL 0.5-2.5 (3) MG/3ML IN SOLN
3.0000 mL | Freq: Once | RESPIRATORY_TRACT | Status: AC
  Administered 2024-09-08: 3 mL via RESPIRATORY_TRACT
  Filled 2024-09-08: qty 3

## 2024-09-08 MED ORDER — ASPIRIN 81 MG PO TBEC
81.0000 mg | DELAYED_RELEASE_TABLET | Freq: Every day | ORAL | Status: DC
  Administered 2024-09-09 – 2024-09-12 (×3): 81 mg via ORAL
  Filled 2024-09-08 (×4): qty 1

## 2024-09-08 MED ORDER — FAMOTIDINE 20 MG PO TABS
40.0000 mg | ORAL_TABLET | Freq: Every day | ORAL | Status: DC
  Administered 2024-09-09: 40 mg via ORAL
  Filled 2024-09-08: qty 2

## 2024-09-08 MED ORDER — ACETAMINOPHEN 650 MG RE SUPP: 650.0000 mg | Freq: Four times a day (QID) | RECTAL | Status: DC | PRN

## 2024-09-08 MED ORDER — ROSUVASTATIN CALCIUM 20 MG PO TABS
20.0000 mg | ORAL_TABLET | Freq: Every day | ORAL | Status: DC
  Administered 2024-09-09: 20 mg via ORAL
  Filled 2024-09-08: qty 1

## 2024-09-08 NOTE — H&P (Cosign Needed Addendum)
 "    Hospital Admission History and Physical Service Pager: [phone]  Patient name: [name] Medical record number: [id number] Date of Birth: Mar 23, 1976 Age: 49 y.o. Gender: female  Primary Care Provider: Cristopher Suzen HERO, NP Consultants: Nephrology, Cardiology Code Status: DNR/DNI which was confirmed with family if patient unable to confirm  Preferred Emergency Contact: Rumalda Ash (significant other) - [phone]  Chief Complaint: SOB, generalized peripheral edema  Assessment and Plan: [name] is a 49 y.o. female presenting with volume overload and new A-fib with RVR. Differential for presentation of this includes:   CHF exacerbation - Presenting sx of SOB and edema and work-up including Pro-BNP > 35k and CXR with mild pulmonary edema and pleural effusion and symptoms most consistent with this. Additionally, significant volume overload can certainly present with A-fib Failing kidney transplant - also possible given recently worsening kidney function. Patient followed by nephrology and recommended for dialysis in Dec 2025 but declined at the time. Plan for HD while admitted Pulmonary embolism/DVT - patient is largely bed bound with LE edema though denies edema in one leg more than the other or any pain (only with palpation) so less likely MI - Troponins elevated but patient denies CP and EKG without acute ST changes so less likely. Troponin elevated likely 2/2 demand ischemia but will continue to follow Pneumonia - possible given CXR findings but patient is afebrile, without leukocytosis and denies cough or sick contacts so less likely Assessment & Plan Atrial fibrillation, new onset (HCC) Volume overload New A-fib with RVR on EKG. BP improving but HR still ~120, but maintaining MAPs and no chest pain. Last Echo in Feb 2024 with EF 55-60% and diastolic dysfunction. Considered dilt drip but holding off pending cardiology recommendations. - Admit to FMTS,  Progressive, attending Dr. Donah - Cardiology consulted, appreciate recommendations -  heparin  5000u x1 for Dvt ppx; holding additional anticoagulation given potential TDC placement tomorrow - S/p IV Lasix  40mg  x1, giving an additional 40mg  - Continue BiPAP for work of breathing - Continue nitroglycerin  drip pending cardiology recommendations - F/u echo, CT chest, UA - F/u troponins - Tylenol  650 mg q6hrs PRN - PT/OT to treat - Continuous cardiac monitoring - Strict I&O's, daily weights - AM RFP, Mg, CBC, TSH Kidney transplant failure Cr 6.87 > 7.40 (unknown baseline though ~1.5 in 2021) - Nephrology consulted in ED, appreciate recommendations - NPO at [facility] - IR consulted for [facility] with plan to start HD after though this may have to be delayed until A-fib is resolved - AM RFP Positive pregnancy test Positive serum pregnancy test though patient is monogamous with a female partner and denies any recent sexual activity with female partners. Possible this is a false positive so will get quant. She does still have her uterus. - F/u hCG quantitative - if still positive, consider transvag US  Chronic health problem CAD - continue home ASA 81mg  daily; holding home Plavix  75 mg daily HTN - holding home Clonidine  0.2 mg TID, Losartan 50 mg daily, Torsemide 20 mg daily, Furosemide  80 mg daily PRN pending cardiology recommendations HLD - continue home Rosuvastatin  20 mg daily GERD - continue home Famotidine  40 mg daily H/o R kidney transplant - continue formulary equivalent of home Mycophenolate  720 mg BID and home Tacrolimus  3 mg BID  FEN/GI: NPO at [facility] VTE Prophylaxis: S/p Heparin  5000u x1; holding additional anticoagulation given potential TDC placement tomorrow  Disposition: Progressive  History of Present Illness:  [name] is a 49 y.o. female  presenting with worsening SOB and generalized edema over several days. Patient states she feels generally fluid overloaded. Denies any chest  pain or palpitations. No cough or sick contacts. No fever.   Patient had a R renal transplant several years ago that is failing. Per chart review, recommended for dialysis in Dec 2024 but  patient denied at the time. She has taken all of her mediations today. Reports she still makes urine. Is largely bed bound.   In the ED, BP elevated to 173/140, HR 107. EKG showing A-fib. Labs significant for Cr 7.40, pro-BNP > 35,000, troponin 231, and  Hgb 7.5. Patient was given duonebs x1, Lasix  40mg  x1 and started on a nitroglycerin  drip and BiPAP.   Review Of Systems: Per HPI with the following additions: none  Pertinent Past Medical History: HTN HLD GERD Anxiety Anemia  Remainder reviewed in history tab.   Pertinent Past Surgical History: Vein ligation Tubal ligation Right kidney transplant (~15 years ago) Cholecystectomy C-section Appendectomy  Remainder reviewed in history tab.   Pertinent Social History: Tobacco use: Yes, 1-1.5 PPD Alcohol use: no Other Substance use: former, hx of substance use Lives with Rumalda (partner)  Pertinent Family History: Father (deceased) - cancer  Remainder reviewed in history tab.   Important Outpatient Medications: Aspirin  81 mg daily Buprenorphine  HCl-naloxone  HCl 8.6-2.1 mg sublingual 1 tablet at midday, 1/2 tablet in the evening Clonidine  0.2 mg TID Plavix  75 mg daily Famotidine  40 mg daily Furosemide  80 mg daily PRN for edema Losartan 50 mg daily Mycophenolate  720 mg BID Rosuvastatin  20 mg daily Tacrolimus  3 mg BID Torsemide 20 mg daily Ventolin  2 puffs q6hrs PRN Zolpidem  10 mg daily  Remainder reviewed in medication history.   Objective: BP (!) 173/140   Pulse (!) 107   Temp 97.6 F (36.4 C)   Resp 18   SpO2 90%   Exam: General: Alert, illl-appearing female in NAD.  HEENT: No sign of trauma, EOM grossly intact. Cardiovascular: RRR, no m/r/g though difficult to auscultate 2/2 body habitus Pulmonary: Somewhat increased WOB  on BiPAP. Diminished breath sounds in lower lung bases though difficult to auscultate 2/2 body habitus Abdomen: Soft, non-tender Extremities: Warm, 1+ pitting edema in both LE with TTP Neurologic: Moves all four extremities appropriately  Labs:  CBC BMET  Recent Labs  Lab 09/08/24 1929 09/08/24 1939  WBC 10.3  --   HGB 7.5* 8.2*  HCT 25.7* 24.0*  PLT 343  --    Recent Labs  Lab 09/08/24 1929 09/08/24 1939  NA 131* 131*  K 4.9 4.8  CL 99 103  CO2 15*  --   BUN 96* 103*  CREATININE 6.87* 7.40*  GLUCOSE 96 95  CALCIUM  8.0*  --      Pertinent additional labs:  Pro BNP > 35k Troponin 231  EKG: A-fib with HR 122, QTC 491, no acute ST changes, old anterior infarct  Imaging Studies Performed:  DG Chest 1 View Result Date: 09/08/2024 1. Mild pulmonary edema with bibasilar airspace opacities and small right pleural effusion, which may represent pneumonia.  2. Cardiomegaly  Jerrie Gathers, DO 09/08/2024, 8:56 PM PGY-1, [facility]  FPTS Intern pager: [phone], text pages welcome Secure chat group [facility] Teaching Service  "

## 2024-09-08 NOTE — Progress Notes (Signed)
 Brief Nephrology Note  follows with Gearline in our office. History of failing Kidney transplant. Seen in Dec and it was recommended she start dialysis but patient refused. Poor access options with history of SVC syndrome. NPO at [facility] and consult to IR for [facility] non urgently tomorrow with plan to start HD after.  Interested in doing home HD. May benefit from eval by [facility] for thigh graft while inpatient.

## 2024-09-08 NOTE — Assessment & Plan Note (Addendum)
 New A-fib with RVR on EKG. BP improving but HR still ~120, but maintaining MAPs and no chest pain. Last Echo in Feb 2024 with EF 55-60% and diastolic dysfunction. Considered dilt drip but holding off pending cardiology recommendations. - Admit to FMTS, Progressive, attending Dr. Donah - Cardiology consulted, appreciate recommendations -  heparin  5000u x1 for Dvt ppx; holding additional anticoagulation given potential TDC placement tomorrow - S/p IV Lasix  40mg  x1, giving an additional 40mg  - Continue BiPAP for work of breathing - Continue nitroglycerin  drip pending cardiology recommendations - F/u echo, CT chest, UA - F/u troponins - Tylenol  650 mg q6hrs PRN - PT/OT to treat - Continuous cardiac monitoring - Strict I&O's, daily weights - AM RFP, Mg, CBC, TSH

## 2024-09-08 NOTE — ED Notes (Signed)
 Pt reports she does not have to urinate right now.

## 2024-09-08 NOTE — Assessment & Plan Note (Addendum)
 Positive serum pregnancy test though patient is monogamous with a female partner and denies any recent sexual activity with female partners. Possible this is a false positive so will get quant. She does still have her uterus. - F/u hCG quantitative - if still positive, consider transvag US 

## 2024-09-08 NOTE — ED Triage Notes (Signed)
 The pt had a kidney transplant  14 years ago  for 4 days she had ahd fluid retention  and distention pitting  edema everywhereon her body higher bp than normal   cbg 136 p 144

## 2024-09-08 NOTE — ED Notes (Signed)
 Patient transported to CT

## 2024-09-08 NOTE — Assessment & Plan Note (Signed)
 CAD - continue home ASA 81mg  daily; holding home Plavix  75 mg daily HTN - holding home Clonidine  0.2 mg TID, Losartan 50 mg daily, Torsemide 20 mg daily, Furosemide  80 mg daily PRN pending cardiology recommendations HLD - continue home Rosuvastatin  20 mg daily GERD - continue home Famotidine  40 mg daily H/o R kidney transplant - continue formulary equivalent of home Mycophenolate  720 mg BID and home Tacrolimus  3 mg BID

## 2024-09-08 NOTE — Assessment & Plan Note (Signed)
 Cr 6.87 > 7.40 (unknown baseline though ~1.5 in 2021) - Nephrology consulted in ED, appreciate recommendations - NPO at [facility] - IR consulted for [facility] with plan to start HD after though this may have to be delayed until A-fib is resolved - AM RFP

## 2024-09-08 NOTE — ED Provider Notes (Signed)
 " [name] EMERGENCY DEPARTMENT AT [facility] Provider Note   CSN: [id number] Arrival date & time: 09/08/24  1843     Patient presents with: No chief complaint on file.   [name] is a 49 y.o. female.  {Add pertinent medical, surgical, social history, OB history to HPI:3936} 49 year old female presents for evaluation of shortness of breath.  States she noticed some swelling in her legs starting Friday and has gotten worse shortening shortness of breath since then.  States she had a kidney transplant 15 years ago and this 1 has run its course.  She states she is not on dialysis.  Denies any lung problems or heart problems.  Denies any other symptoms or concerns.        Prior to Admission medications  Medication Sig Start Date End Date Taking? Authorizing Provider  aspirin  81 MG EC tablet Take 81 mg by mouth daily.     [provider]  Buprenorphine  HCl-Naloxone  HCl (ZUBSOLV ) 8.6-2.1 MG SUBL Place 0.5-1 tablets under the tongue See admin instructions. Take 1 tablet, 1 tablet at midday, 0.5 tablet in the evening    [provider]  cloNIDine  (CATAPRES ) 0.2 MG tablet Take 0.2 mg by mouth 3 (three) times daily.    [provider]  clopidogrel  (PLAVIX ) 75 MG tablet Take 75 mg by mouth daily. 03/18/20   [provider]  famotidine  (PEPCID ) 40 MG tablet Take 40 mg by mouth at bedtime. 01/19/20   [provider]  furosemide  (LASIX ) 80 MG tablet Take 80 mg by mouth daily as needed for edema.    [provider]  losartan (COZAAR) 50 MG tablet Take 50 mg by mouth daily. Patient not taking: Reported on 08/29/2022    [provider]  mycophenolate  (MYFORTIC ) 360 MG TBEC Take 720 mg by mouth 2 (two) times daily.      [provider]  rosuvastatin  (CRESTOR ) 20 MG tablet Take 20 mg by mouth daily. 04/13/20   [provider]  tacrolimus  (PROGRAF ) 1 MG capsule Take 3 mg by mouth 2 (two) times daily.       [provider]  torsemide (DEMADEX) 20 MG tablet Take 20 mg by mouth daily. 07/12/22   [provider]  VENTOLIN  HFA 108 (90 Base) MCG/ACT inhaler 2 puffs every 6 (six) hours as needed. 07/22/22   [provider]  zolpidem  (AMBIEN ) 10 MG tablet Take 10 mg by mouth at bedtime.    [provider]    Allergies: Metoclopramide, Ciprofloxacin, Morphine, and Vancomycin    Review of Systems  Constitutional:  Positive for fatigue. Negative for chills and fever.  HENT:  Negative for ear pain and sore throat.   Eyes:  Negative for pain and visual disturbance.  Respiratory:  Positive for shortness of breath. Negative for cough.   Cardiovascular:  Negative for chest pain and palpitations.  Gastrointestinal:  Negative for abdominal pain and vomiting.  Genitourinary:  Negative for dysuria and hematuria.  Musculoskeletal:  Negative for arthralgias and back pain.  Skin:  Negative for color change and rash.  Neurological:  Negative for seizures and syncope.  All other systems reviewed and are negative.   Updated Vital Signs BP (!) 173/140   Temp 97.6 F (36.4 C)   Resp 18   SpO2 (!) 89%   Physical Exam Vitals and nursing note reviewed.  Constitutional:      General: She is in acute distress.     Appearance: Normal appearance. She  is well-developed. She is ill-appearing.  HENT:     Head: Normocephalic and atraumatic.  Eyes:     Conjunctiva/sclera: Conjunctivae normal.  Cardiovascular:     Rate and Rhythm: Regular rhythm. Tachycardia present.     Heart sounds: No murmur heard. Pulmonary:     Effort: Respiratory distress present.     Comments: Decreased breath sounds bilaterally Abdominal:     Palpations: Abdomen is soft.     Tenderness: There is no abdominal tenderness.  Musculoskeletal:        General: No swelling.     Cervical back: Neck supple.     Right lower leg: Edema present.     Left lower leg: Edema present.  Skin:    General: Skin is  warm and dry.     Capillary Refill: Capillary refill takes less than 2 seconds.  Neurological:     Mental Status: She is alert.  Psychiatric:        Mood and Affect: Mood normal.     (all labs ordered are listed, but only abnormal results are displayed) Labs Reviewed  BASIC METABOLIC PANEL WITH GFR  PRO BRAIN NATRIURETIC PEPTIDE  CBC WITH DIFFERENTIAL/PLATELET  I-STAT CHEM 8, ED  TROPONIN T, HIGH SENSITIVITY    EKG: EKG Interpretation Date/Time:  Wednesday September 08 2024 19:02:17 EST Ventricular Rate:  139 PR Interval:    QRS Duration:  88 QT Interval:  302 QTC Calculation: 460 R Axis:   106  Text Interpretation: Atrial fibrillation Anterior infarct, old Compared with prior EKG from 05/11/2020 Confirmed by Gennaro Bouchard ([id number]) on 09/08/2024 7:03:30 PM  Radiology: No results found.  {Document cardiac monitor, telemetry assessment procedure when appropriate:32947} Procedures   Medications Ordered in the ED  ipratropium-albuterol  (DUONEB) 0.5-2.5 (3) MG/3ML nebulizer solution 3 mL (has no administration in time range)      {Click here for ABCD2, HEART and other calculators REFRESH Note before signing:1}                              Medical Decision Making Amount and/or Complexity of Data Reviewed Labs: ordered. Radiology: ordered.  Risk Prescription drug management.   ***  {Document critical care time when appropriate  Document review of labs and clinical decision tools ie CHADS2VASC2, etc  Document your independent review of radiology images and any outside records  Document your discussion with family members, caretakers and with consultants  Document social determinants of health affecting pt's care  Document your decision making why or why not admission, treatments were needed:32947:::1}   Final diagnoses:  None    ED Discharge Orders     None        "

## 2024-09-09 ENCOUNTER — Inpatient Hospital Stay (HOSPITAL_COMMUNITY)

## 2024-09-09 ENCOUNTER — Ambulatory Visit (HOSPITAL_COMMUNITY): Payer: Self-pay

## 2024-09-09 ENCOUNTER — Encounter (HOSPITAL_COMMUNITY): Payer: Self-pay

## 2024-09-09 ENCOUNTER — Encounter (HOSPITAL_COMMUNITY): Payer: Self-pay | Admitting: Nephrology

## 2024-09-09 DIAGNOSIS — D509 Iron deficiency anemia, unspecified: Secondary | ICD-10-CM | POA: Insufficient documentation

## 2024-09-09 DIAGNOSIS — J9601 Acute respiratory failure with hypoxia: Secondary | ICD-10-CM | POA: Diagnosis not present

## 2024-09-09 LAB — URINALYSIS, ROUTINE W REFLEX MICROSCOPIC
Bilirubin Urine: NEGATIVE
Glucose, UA: NEGATIVE mg/dL
Ketones, ur: NEGATIVE mg/dL
Nitrite: NEGATIVE
Protein, ur: 100 mg/dL — AB
Specific Gravity, Urine: 1.01 (ref 1.005–1.030)
pH: 5 (ref 5.0–8.0)

## 2024-09-09 LAB — IRON AND TIBC
Iron: 13 ug/dL — ABNORMAL LOW (ref 28–170)
Saturation Ratios: 6 % — ABNORMAL LOW (ref 10.4–31.8)
TIBC: 239 ug/dL — ABNORMAL LOW (ref 250–450)
UIBC: 226 ug/dL

## 2024-09-09 LAB — RENAL FUNCTION PANEL
Albumin: 2.2 g/dL — ABNORMAL LOW (ref 3.5–5.0)
Anion gap: 18 — ABNORMAL HIGH (ref 5–15)
BUN: 97 mg/dL — ABNORMAL HIGH (ref 6–20)
CO2: 15 mmol/L — ABNORMAL LOW (ref 22–32)
Calcium: 8 mg/dL — ABNORMAL LOW (ref 8.9–10.3)
Chloride: 98 mmol/L (ref 98–111)
Creatinine, Ser: 6.89 mg/dL — ABNORMAL HIGH (ref 0.44–1.00)
GFR, Estimated: 7 mL/min — ABNORMAL LOW
Glucose, Bld: 85 mg/dL (ref 70–99)
Phosphorus: 5.9 mg/dL — ABNORMAL HIGH (ref 2.5–4.6)
Potassium: 5 mmol/L (ref 3.5–5.1)
Sodium: 131 mmol/L — ABNORMAL LOW (ref 135–145)

## 2024-09-09 LAB — VITAMIN B12: Vitamin B-12: 1346 pg/mL — ABNORMAL HIGH (ref 180–914)

## 2024-09-09 LAB — CBC
HCT: 24.6 % — ABNORMAL LOW (ref 36.0–46.0)
Hemoglobin: 7.1 g/dL — ABNORMAL LOW (ref 12.0–15.0)
MCH: 22.5 pg — ABNORMAL LOW (ref 26.0–34.0)
MCHC: 28.9 g/dL — ABNORMAL LOW (ref 30.0–36.0)
MCV: 78.1 fL — ABNORMAL LOW (ref 80.0–100.0)
Platelets: 343 10*3/uL (ref 150–400)
RBC: 3.15 MIL/uL — ABNORMAL LOW (ref 3.87–5.11)
RDW: 16.9 % — ABNORMAL HIGH (ref 11.5–15.5)
WBC: 9.8 10*3/uL (ref 4.0–10.5)
nRBC: 0 % (ref 0.0–0.2)

## 2024-09-09 LAB — RETICULOCYTES
Immature Retic Fract: 27.7 % — ABNORMAL HIGH (ref 2.3–15.9)
RBC.: 3.24 MIL/uL — ABNORMAL LOW (ref 3.87–5.11)
Retic Count, Absolute: 68 10*3/uL (ref 19.0–186.0)
Retic Ct Pct: 2.1 % (ref 0.4–3.1)

## 2024-09-09 LAB — URINE DRUG SCREEN
Amphetamines: NEGATIVE
Barbiturates: NEGATIVE
Benzodiazepines: NEGATIVE
Cocaine: NEGATIVE
Fentanyl: NEGATIVE
Methadone Scn, Ur: NEGATIVE
Opiates: NEGATIVE
Tetrahydrocannabinol: NEGATIVE

## 2024-09-09 LAB — TROPONIN T, HIGH SENSITIVITY: Troponin T High Sensitivity: 236 ng/L (ref 0–19)

## 2024-09-09 LAB — HEPARIN LEVEL (UNFRACTIONATED): Heparin Unfractionated: <0.1 [IU]/mL — ABNORMAL LOW (ref 0.30–0.70)

## 2024-09-09 LAB — MAGNESIUM: Magnesium: 2 mg/dL (ref 1.7–2.4)

## 2024-09-09 LAB — FERRITIN: Ferritin: 169 ng/mL (ref 11–307)

## 2024-09-09 LAB — FOLATE: Folate: 9.4 ng/mL

## 2024-09-09 MED ORDER — LIDOCAINE-EPINEPHRINE 1 %-1:100000 IJ SOLN
INTRAMUSCULAR | Status: AC
  Filled 2024-09-09: qty 20

## 2024-09-09 MED ORDER — PENTAFLUOROPROP-TETRAFLUOROETH EX AERO: 1.0000 | INHALATION_SPRAY | CUTANEOUS | Status: DC | PRN

## 2024-09-09 MED ORDER — ANTICOAGULANT SODIUM CITRATE 4% (200MG/5ML) IV SOLN
5.0000 mL | Status: DC | PRN
  Filled 2024-09-09: qty 5

## 2024-09-09 MED ORDER — HEPARIN SODIUM (PORCINE) 1000 UNIT/ML IJ SOLN
4000.0000 [IU] | Freq: Once | INTRAMUSCULAR | Status: AC
  Administered 2024-09-09: 4.2 mL via INTRAVENOUS

## 2024-09-09 MED ORDER — CLONIDINE HCL 0.1 MG PO TABS
0.2000 mg | ORAL_TABLET | Freq: Three times a day (TID) | ORAL | Status: AC
  Administered 2024-09-09 – 2024-09-17 (×20): 0.2 mg via ORAL
  Filled 2024-09-09 (×23): qty 2

## 2024-09-09 MED ORDER — METOPROLOL TARTRATE 50 MG PO TABS: 50.0000 mg | ORAL_TABLET | Freq: Four times a day (QID) | ORAL | Status: DC

## 2024-09-09 MED ORDER — NYSTATIN 100000 UNIT/GM EX CREA
TOPICAL_CREAM | Freq: Two times a day (BID) | CUTANEOUS | Status: AC
  Administered 2024-09-09 – 2024-09-11 (×2): 1 via TOPICAL
  Filled 2024-09-09: qty 30

## 2024-09-09 MED ORDER — FERROUS SULFATE 325 (65 FE) MG PO TABS
325.0000 mg | ORAL_TABLET | ORAL | Status: AC
  Administered 2024-09-09 – 2024-09-17 (×5): 325 mg via ORAL
  Filled 2024-09-09 (×5): qty 1

## 2024-09-09 MED ORDER — CEFAZOLIN SODIUM-DEXTROSE 2-4 GM/100ML-% IV SOLN
INTRAVENOUS | Status: AC | PRN
  Administered 2024-09-09: 2 g via INTRAVENOUS

## 2024-09-09 MED ORDER — FAMOTIDINE 20 MG PO TABS
10.0000 mg | ORAL_TABLET | Freq: Every day | ORAL | Status: AC
  Administered 2024-09-10 – 2024-09-16 (×8): 10 mg via ORAL
  Filled 2024-09-09 (×8): qty 1

## 2024-09-09 MED ORDER — HYDROXYZINE HCL 10 MG PO TABS: 10.0000 mg | ORAL_TABLET | Freq: Three times a day (TID) | ORAL | Status: DC | PRN

## 2024-09-09 MED ORDER — SODIUM CHLORIDE 0.9 % IV SOLN
200.0000 mg | INTRAVENOUS | Status: AC
  Administered 2024-09-09 – 2024-09-11 (×3): 200 mg via INTRAVENOUS
  Filled 2024-09-09: qty 10
  Filled 2024-09-09 (×2): qty 200

## 2024-09-09 MED ORDER — METOPROLOL TARTRATE 25 MG PO TABS
25.0000 mg | ORAL_TABLET | Freq: Four times a day (QID) | ORAL | Status: DC
  Administered 2024-09-09 – 2024-09-10 (×3): 25 mg via ORAL
  Filled 2024-09-09 (×3): qty 1

## 2024-09-09 MED ORDER — CEFAZOLIN SODIUM-DEXTROSE 2-4 GM/100ML-% IV SOLN
INTRAVENOUS | Status: AC
  Filled 2024-09-09: qty 100

## 2024-09-09 MED ORDER — ALTEPLASE 2 MG IJ SOLR
2.0000 mg | Freq: Once | INTRAMUSCULAR | Status: DC | PRN
  Filled 2024-09-09: qty 2

## 2024-09-09 MED ORDER — MIDAZOLAM HCL (PF) 2 MG/2ML IJ SOLN
INTRAMUSCULAR | Status: AC | PRN
  Administered 2024-09-09: .5 mg via INTRAVENOUS
  Administered 2024-09-09: 1 mg via INTRAVENOUS

## 2024-09-09 MED ORDER — DARBEPOETIN ALFA 100 MCG/0.5ML IJ SOSY
100.0000 ug | PREFILLED_SYRINGE | INTRAMUSCULAR | Status: AC
  Administered 2024-09-09 – 2024-09-16 (×2): 100 ug via SUBCUTANEOUS
  Filled 2024-09-09 (×2): qty 0.5

## 2024-09-09 MED ORDER — DIPHENHYDRAMINE-ZINC ACETATE 2-0.1 % EX CREA
TOPICAL_CREAM | Freq: Two times a day (BID) | CUTANEOUS | Status: AC | PRN
  Administered 2024-09-09: 1 via TOPICAL
  Filled 2024-09-09: qty 28

## 2024-09-09 MED ORDER — CHLORHEXIDINE GLUCONATE CLOTH 2 % EX PADS
6.0000 | MEDICATED_PAD | Freq: Every day | CUTANEOUS | Status: AC
  Administered 2024-09-10 – 2024-09-15 (×4): 6 via TOPICAL

## 2024-09-09 MED ORDER — METOPROLOL TARTRATE 25 MG PO TABS
25.0000 mg | ORAL_TABLET | ORAL | Status: AC
  Administered 2024-09-09: 25 mg via ORAL
  Filled 2024-09-09: qty 1

## 2024-09-09 MED ORDER — METOPROLOL TARTRATE 25 MG PO TABS
25.0000 mg | ORAL_TABLET | Freq: Four times a day (QID) | ORAL | Status: DC
  Administered 2024-09-09: 25 mg via ORAL
  Filled 2024-09-09: qty 1

## 2024-09-09 MED ORDER — METOPROLOL SUCCINATE ER 25 MG PO TB24
12.5000 mg | ORAL_TABLET | Freq: Two times a day (BID) | ORAL | Status: DC
  Administered 2024-09-09: 12.5 mg via ORAL
  Filled 2024-09-09: qty 1

## 2024-09-09 MED ORDER — MIDAZOLAM HCL 2 MG/2ML IJ SOLN
INTRAMUSCULAR | Status: AC
  Filled 2024-09-09: qty 2

## 2024-09-09 MED ORDER — LIDOCAINE HCL (PF) 1 % IJ SOLN
5.0000 mL | INTRAMUSCULAR | Status: DC | PRN
  Filled 2024-09-09: qty 5

## 2024-09-09 MED ORDER — FENTANYL CITRATE (PF) 100 MCG/2ML IJ SOLN
INTRAMUSCULAR | Status: AC | PRN
  Administered 2024-09-09 (×4): 25 ug via INTRAVENOUS

## 2024-09-09 MED ORDER — HEPARIN (PORCINE) 25000 UT/250ML-% IV SOLN
1700.0000 [IU]/h | INTRAVENOUS | Status: DC
  Administered 2024-09-09: 1350 [IU]/h via INTRAVENOUS
  Administered 2024-09-09: 1050 [IU]/h via INTRAVENOUS
  Administered 2024-09-09 – 2024-09-10 (×2): 1350 [IU]/h via INTRAVENOUS
  Administered 2024-09-10 – 2024-09-12 (×2): 1700 [IU]/h via INTRAVENOUS
  Filled 2024-09-09 (×5): qty 250

## 2024-09-09 MED ORDER — HEPARIN SODIUM (PORCINE) 1000 UNIT/ML DIALYSIS
1000.0000 [IU] | INTRAMUSCULAR | Status: DC | PRN
  Filled 2024-09-09: qty 1

## 2024-09-09 MED ORDER — FENTANYL CITRATE (PF) 100 MCG/2ML IJ SOLN
INTRAMUSCULAR | Status: AC
  Filled 2024-09-09: qty 2

## 2024-09-09 MED ORDER — LIDOCAINE-EPINEPHRINE 1 %-1:100000 IJ SOLN
20.0000 mL | Freq: Once | INTRAMUSCULAR | Status: AC
  Administered 2024-09-09: 10 mL

## 2024-09-09 MED ORDER — HEPARIN SODIUM (PORCINE) 1000 UNIT/ML IJ SOLN
INTRAMUSCULAR | Status: AC
  Filled 2024-09-09: qty 10

## 2024-09-09 MED ORDER — FERROUS SULFATE 325 (65 FE) MG PO TBEC
325.0000 mg | DELAYED_RELEASE_TABLET | ORAL | Status: DC
  Filled 2024-09-09: qty 1

## 2024-09-09 MED ORDER — ACETAMINOPHEN 325 MG PO TABS
650.0000 mg | ORAL_TABLET | Freq: Four times a day (QID) | ORAL | Status: AC | PRN
  Administered 2024-09-10 – 2024-09-15 (×4): 650 mg via ORAL
  Filled 2024-09-09 (×4): qty 2

## 2024-09-09 MED ORDER — ATORVASTATIN CALCIUM 40 MG PO TABS
40.0000 mg | ORAL_TABLET | Freq: Every day | ORAL | Status: AC
  Administered 2024-09-09 – 2024-09-17 (×8): 40 mg via ORAL
  Filled 2024-09-09 (×9): qty 1

## 2024-09-09 MED ORDER — ZOLPIDEM TARTRATE 5 MG PO TABS
5.0000 mg | ORAL_TABLET | Freq: Every day | ORAL | Status: AC
  Administered 2024-09-09 – 2024-09-16 (×7): 5 mg via ORAL
  Filled 2024-09-09 (×8): qty 1

## 2024-09-09 MED ORDER — ACETAMINOPHEN 650 MG RE SUPP: 650.0000 mg | Freq: Four times a day (QID) | RECTAL | Status: DC | PRN

## 2024-09-09 MED ORDER — LIDOCAINE-PRILOCAINE 2.5-2.5 % EX CREA
1.0000 | TOPICAL_CREAM | CUTANEOUS | Status: DC | PRN
  Filled 2024-09-09: qty 5

## 2024-09-09 NOTE — Progress Notes (Signed)
 PHARMACY - ANTICOAGULATION CONSULT NOTE  Pharmacy Consult for heparin  Indication: atrial fibrillation  Allergies[1]  Patient Measurements: Height: 5' 1.5 (156.2 cm) Weight: 97.2 kg (214 lb 4.6 oz) IBW/kg (Calculated) : 48.95 HEPARIN  DW (KG): 70  Vital Signs: Temp: 97.7 F (36.5 C) (01/29 0905) Temp Source: Oral (01/29 0905) BP: 131/78 (01/29 1135) Pulse Rate: 99 (01/29 1135)  Labs: Recent Labs    09/08/24 1929 09/08/24 1939 09/09/24 0109 09/09/24 1048  HGB 7.5* 8.2* 7.1*  --   HCT 25.7* 24.0* 24.6*  --   PLT 343  --  343  --   HEPARINUNFRC  --   --   --  <0.10*  CREATININE 6.87* 7.40* 6.89*  --     Estimated Creatinine Clearance: 11 mL/min (A) (by C-G formula based on SCr of 6.89 mg/dL (H)).   Medical History: Past Medical History:  Diagnosis Date   Anemia    Anxiety    GERD (gastroesophageal reflux disease)    Hyperlipidemia    Hypertension    Assessment: 41 yoF presented with shortness of breath. Pharmacy consulted to dose heparin  for afib.  -Hx of SVC syndrome w/ extensive central venous stenosis s/p venous stent placed in 2015 on DAPT -Hgb 7s (bl~7-8)-anemia of chronic kidney disease, plts WNL -No prior oral anticoagulation   1/29 AM: Heparin  level undetectable on heparin  1050 units/hr most likely in the setting of no bolus dose given. No issues with infusion or signs or symptoms of bleeding noted per RN.  Hgb 7.1; Plt 343, Hct 24.6  Goal of Therapy:  Heparin  level 0.3-0.7 units/ml Monitor platelets by anticoagulation protocol: Yes   Plan:  -No bolus given Hgb level -Inc heparin  infusion to 1350 units/hr -8h heparin  level -F/u plan for HD and plans for cardioversion   R. Samual Satterfield, PharmD, RPh PGY-1 Acute Care Pharmacy Resident [facility] Please refer to [facility] for [facility] numbers 09/09/2024 12:02 PM         [1]  Allergies Allergen Reactions   Metoclopramide Anxiety and Other (See Comments)   Ciprofloxacin Nausea  Only   Morphine Other (See Comments)    Headache /migraine    Vancomycin     gives patient red man syndrome

## 2024-09-09 NOTE — Evaluation (Signed)
 Physical Therapy Evaluation Patient Details Name: [name] MRN: [id number] DOB: 12-09-1975 Today's Date: 09/09/2024  History of Present Illness  Pt is a 49 y.o. female who presented 09/08/24 with SOB and generalized peripheral edema. Pt admitted with volume overload and new A-fib with RVR. Pt also with kidney transplant failure and questionable false positive pregnancy test. PMH: end-stage kidney disease s/p kidney transplant in 2010, HTN, SVC syndrome, HLD, anemia   Clinical Impression  Pt presents with condition above and deficits mentioned below, see PT Problem List. PTA, she was independent without DME, living with her girlfriend in a 2-level house with 3 STE. Her bedroom and shower are upstairs, but she can remain on the main level with a half bathroom if needed. Her girlfriend works from home, so she can provide 24/7 care if needed. Currently, the pt is limited by fatigue and generally feeling unwell. With encouragement, pt did get OOB and ambulate up to ~30 ft, but needed a RW for standing mobility and intermittent light minA for transfers and sit to supine transitions. She displays deficits in bil lower leg skin integrity, generalized strength, balance, and activity tolerance. Her legs also display some pitting edema, L>R. The pt will likely progress quickly as her swelling reduces and as she ideally begins to feel better, thus will plan for pt to d/c home with her girlfriend's support and HHPT follow-up. However, if she does not progress as anticipated, then may need to pursue other rehab options. Will continue to follow acutely. See General Comments below in regards to vitals during this session.      If plan is discharge home, recommend the following: A little help with walking and/or transfers;A little help with bathing/dressing/bathroom;Assistance with cooking/housework;Assist for transportation;Help with stairs or ramp for entrance   Can travel by private vehicle         Equipment Recommendations BSC/3in1;Rollator (4 wheels)  Recommendations for Other Services       Functional Status Assessment Patient has had a recent decline in their functional status and demonstrates the ability to make significant improvements in function in a reasonable and predictable amount of time.     Precautions / Restrictions Precautions Precautions: Fall;Other (comment) Precaution/Restrictions Comments: watch HR and SpO2 Restrictions Weight Bearing Restrictions Per Provider Order: No      Mobility  Bed Mobility Overal bed mobility: Needs Assistance Bed Mobility: Supine to Sit, Sit to Supine     Supine to sit: Contact guard, HOB elevated, Used rails Sit to supine: Min assist, HOB elevated, Used rails   General bed mobility comments: CGA for safety exiting R EOB with pt using rails. MinA at legs to lift back into bed with return to supine, but pt likely capable of doing so without assistance but she preferred it this date.    Transfers Overall transfer level: Needs assistance Equipment used: Rolling walker (2 wheels) Transfers: Sit to/from Stand Sit to Stand: Min assist, +2 safety/equipment           General transfer comment: Cued pt to push up from bed but pt chose to pull up on RW with bil UEs instead. MinA, +2 for safety, to power up to stand from EOB.    Ambulation/Gait Ambulation/Gait assistance: Contact guard assist, +2 safety/equipment Gait Distance (Feet): 30 Feet Assistive device: Rolling walker (2 wheels) Gait Pattern/deviations: Step-through pattern, Decreased step length - right, Decreased step length - left, Decreased stride length, Trunk flexed Gait velocity: reduced Gait velocity interpretation: <1.8 ft/sec, indicate of risk for  recurrent falls   General Gait Details: Pt takes slow, small steps while maintaining a flexed posture. VCs provided to remain more proximal to RW to improve RW control and stability. CGA +2 for safety  Stairs             Wheelchair Mobility     Tilt Bed    Modified Rankin (Stroke Patients Only)       Balance Overall balance assessment: Needs assistance Sitting-balance support: No upper extremity supported, Feet supported Sitting balance-Leahy Scale: Fair Sitting balance - Comments: able to reach to mid lower legs without LOB, supervision for safety   Standing balance support: Bilateral upper extremity supported, During functional activity, Reliant on assistive device for balance Standing balance-Leahy Scale: Poor Standing balance comment: reliant on RW                             Pertinent Vitals/Pain Pain Assessment Pain Assessment: 0-10 Pain Score: 2  Pain Location: itching in feet Pain Descriptors / Indicators: Other (Comment) (itching) Pain Intervention(s): Limited activity within patient's tolerance, Monitored during session, Repositioned    Home Living Family/patient expects to be discharged to:: Private residence Living Arrangements: Spouse/significant other (girlfriend) Available Help at Discharge: Family;Available 24 hours/day (girlfriend works from home) Type of Home: House Home Access: Stairs to enter Entrance Stairs-Rails: Can reach both Entrance Stairs-Number of Steps: 3 Alternate Level Stairs-Number of Steps: 15 Home Layout: Two level;1/2 bath on main level;Bed/bath upstairs;Able to live on main level with bedroom/bathroom Home Equipment: None      Prior Function Prior Level of Function : Independent/Modified Independent             Mobility Comments: No AD; no recent falls ADLs Comments: Does not drive or work     Extremity/Trunk Assessment   Upper Extremity Assessment Upper Extremity Assessment: Defer to OT evaluation    Lower Extremity Assessment Lower Extremity Assessment: Generalized weakness;RLE deficits/detail;LLE deficits/detail RLE Deficits / Details: gross MMT scores of 4- to 4; denied numbness/tingling bil; pitting edema  score of 1+ on R; wounds noted lower leg LLE Deficits / Details: gross MMT scores of 4- to 4; denied numbness/tingling bil; pitting edema score of 2+ on R; wounds noted lower leg    Cervical / Trunk Assessment Cervical / Trunk Assessment: Normal  Communication   Communication Communication: No apparent difficulties    Cognition Arousal: Alert Behavior During Therapy: Flat affect   PT - Cognitive impairments: No apparent impairments                       PT - Cognition Comments: Pt with flat affect and not very motivated to participate due to not feeling well. Pt agreeable with encouragement. Slow to initiate also due to not feeling well. Pt apologetic of her mood at end of session. Following commands: Intact       Cueing Cueing Techniques: Verbal cues     General Comments General comments (skin integrity, edema, etc.): Pleth unreliable when ambulating, stating pt as low as 77% on RA, but when pleth was reliable while sitting after ambulating it read 95% on RA; HR up to 146 bpm when ambulating, recovered to 120s with seated rest and eventually returned to 90s when supine end of session    Exercises     Assessment/Plan    PT Assessment Patient needs continued PT services  PT Problem List Decreased strength;Decreased activity tolerance;Decreased balance;Decreased mobility;Cardiopulmonary status limiting  activity;Decreased skin integrity       PT Treatment Interventions DME instruction;Gait training;Stair training;Functional mobility training;Therapeutic activities;Therapeutic exercise;Balance training;Neuromuscular re-education;Patient/family education    PT Goals (Current goals can be found in the Care Plan section)  Acute Rehab PT Goals Patient Stated Goal: to feel better PT Goal Formulation: With patient Time For Goal Achievement: 09/23/24 Potential to Achieve Goals: Good    Frequency Min 2X/week     Co-evaluation   Reason for Co-Treatment: For  patient/therapist safety;To address functional/ADL transfers;Other (comment) (pt unlikely to tolerate x2 sessions today) PT goals addressed during session: Mobility/safety with mobility;Balance;Proper use of DME OT goals addressed during session: ADL's and self-care       AM-PAC PT 6 Clicks Mobility  Outcome Measure Help needed turning from your back to your side while in a flat bed without using bedrails?: A Little Help needed moving from lying on your back to sitting on the side of a flat bed without using bedrails?: A Little Help needed moving to and from a bed to a chair (including a wheelchair)?: A Little Help needed standing up from a chair using your arms (e.g., wheelchair or bedside chair)?: A Little Help needed to walk in hospital room?: A Little Help needed climbing 3-5 steps with a railing? : A Lot 6 Click Score: 17    End of Session Equipment Utilized During Treatment: Gait belt Activity Tolerance: Patient tolerated treatment well;Patient limited by fatigue Patient left: in bed;with call bell/phone within reach;with bed alarm set Nurse Communication: Mobility status;Other (comment) (vitals) PT Visit Diagnosis: Unsteadiness on feet (R26.81);Other abnormalities of gait and mobility (R26.89);Muscle weakness (generalized) (M62.81);Difficulty in walking, not elsewhere classified (R26.2)    Time: 9040-8967 PT Time Calculation (min) (ACUTE ONLY): 33 min   Charges:   PT Evaluation $PT Eval Moderate Complexity: 1 Mod   PT General Charges $$ ACUTE PT VISIT: 1 Visit         Theo Ferretti, PT, DPT Acute Rehabilitation Services  Office: [phone]   Theo CHRISTELLA Ferretti 09/09/2024, 11:25 AM

## 2024-09-09 NOTE — Progress Notes (Signed)
 "  Progress Note  Patient Name: [name] Date of Encounter: 09/09/2024 [facility] Cardiologist: None  Interval Summary   Patient still unwell and uncomfortable due to dyspnea and discomfort/pruritus from lower extremity blistering/swelling. States that dyspnea is notable with ambulation/exertion, otherwise no dyspnea at rest. Takes torsemide 40 mg daily at home for lower extremity swelling, which typically works well up until the past few weeks. She estimates that she has gained about 55 pounds over 2-3 weeks prior to arrival. States that she has had some improvement in her swelling after receiving IV Lasix  overnight. Denies any chest pain or palpitations at this time.   Vital Signs Vitals:   09/09/24 0558 09/09/24 0736 09/09/24 0800 09/09/24 0905  BP: (!) 159/102 (!) 162/106  (!) 165/95  Pulse: (!) 142 (!) 129    Resp: 18 17    Temp:  97.7 F (36.5 C)    TempSrc:  Oral    SpO2: 98% 99%    Weight:      Height:   5' 1.5 (1.562 m)     Intake/Output Summary (Last 24 hours) at 09/09/2024 0918 Last data filed at 09/09/2024 0559 Gross per 24 hour  Intake 437.77 ml  Output 350 ml  Net 87.77 ml      09/09/2024    4:46 AM 09/08/2024   11:24 PM 08/29/2022    3:14 PM  Last 3 Weights  Weight (lbs) 214 lb 4.6 oz 199 lb 8.3 oz 200 lb 3.2 oz  Weight (kg) 97.2 kg 90.5 kg 90.81 kg      Telemetry/ECG  Rapid Afib/flutter in the 140s-150s - Personally Reviewed  Physical Exam  GEN: Chronically ill-appearing female on [place], in no acute distress but appears restless/uncomfortable  Neck: Low neck JVD at 30 degrees Cardiac: Irregularly irregular rhythm, tachycardic, no murmurs, rubs, or gallops.  Respiratory: Moderate crackles at bases bilaterally GI: Soft, nontender MS: 2+ pitting edema of bilateral lower extremities, blisters and weeping present  Assessment & Plan  New onset atrial fibrillation with RVR Felt to be precipitated by acute onset volume overload Normal TSH,  Mag, K Most recent echo 09/2022 showed LVEF 55-60%, no RWMA, G2DD, normal RV, RVSP 32.3 mmHg, mild LAE, mild MR, mild-moderate AI Repeat echo this admission pending Telemetry shows rapid Afib/flutter, rates in the 140s-150s Will focus on rate control with as pt will likely spontaneously convert to sinus following volume removal via HD. However, if pt remains in Afib despite improvement in volume status, can consider TEE/DCCV Not much improvement of rate this morning, however has only had single dose of Toprol -XL 12.5 mg overnight. Will switch to Lopressor  25 mg q6 hrs for better rate control and reassess following HD if needed Continue IV heparin  for now (ok to hold for procedures)  Acute volume overload ?Acute HFpEF  Presented with dyspnea and ongoing lower extremity swelling x several weeks PTA, suspicious for new onset heart failure however unclear given complex hx of kidney transplant/disease ProBNP >35,000 CT chest showed cardiac enlargement with diffuse soft tissue edema, small left and moderate right pleural effusion with bilateral basilar consolidation PTA on torsemide 40 mg daily at home S/p IV Lasix  40 mg x2 in the ED, -350 mL UOP Further volume management via HD per nephrology  History of ESRD s/p renal transplant in 2010 Renal transplant failure IR consulted for [facility] with plan to start HD, has been NPO since midnight Will hopefully spontaneously convert to NSR following volume removal Ongoing management per primary/nephrology  Hypertension PTA on clonidine  0.2 mg TID at home Has been hypertensive since arrival, will increase metoprolol  tartrate 12.5 mg q6 hrs  Hyperlipidemia Last lipid panel in 2021, will need updated panel as outpatient Continue home rosuvastatin  20 mg daily  Elevated troponins Elevated 231 >> 232 >> 236 in the setting of renal transplant failure and likely demand ischemia secondary to acute volume overload Remains chest pain-free Echo pending  Per  primary GERD  For questions or updates, please contact [facility] Please consult [email or web address] for contact info under         Signed, Owen MARLA Daniels, PA-C   "

## 2024-09-09 NOTE — Assessment & Plan Note (Signed)
-   Added every other day 325mg  iron

## 2024-09-09 NOTE — Assessment & Plan Note (Addendum)
 CAD - continue home ASA 81mg  daily; holding home Plavix  75 mg daily HTN - holding home Clonidine  0.2 mg TID, losartan 50 mg daily, torsemide 20 mg daily, furosemide  80 mg daily PRN pending cardiology recommendations HLD - continue home rosuvastatin  20 mg daily - change to Lipitor 40mg  for renal clearance GERD - continue home Famotidine  40 mg daily - change to 10 mg for renal clearance H/o R kidney transplant - continue formulary equivalent of home mycophenolate  720 mg BID and home tacrolimus  3 mg BID

## 2024-09-09 NOTE — Assessment & Plan Note (Addendum)
 Positive serum pregnancy test though patient is monogamous with a female partner and denies any recent sexual activity with female partners. She does still have her uterus. - Serum bhcg 27 - Negative TV US  - Most likely sequale of renal disease, repeat serum bhcg in 48hrs (01/31)

## 2024-09-09 NOTE — Assessment & Plan Note (Addendum)
 A-fib with rate in 150-160s overnight on tele, this AM stable in 120s.  - Cardiology consulted, appreciate recommendations -  heparin  5000u x1 for Dvt ppx; holding additional anticoagulation given potential TDC placement today -  Discontinue nitroglycerin  gtt - Started on 12.5mg  Lopressor  on admission, increased to 25mg  q6h this AM then given one time additional 25mg  dose for better rate control, increase to 50mg  q6h tonight - Continue BiPAP PRN - F/u echo, CT chest, UA - F/u troponins: discontinue trend, trended flat this AM - Tylenol  650 mg q6hrs PRN - PT/OT to treat - Continuous cardiac monitoring - Strict I&O's, daily weights - Daily Mg, CBC

## 2024-09-09 NOTE — Procedures (Signed)
 IR tech was called to bedside by RN due to bloody dressing and bleeding from tunneled catheter site. Catheter not actively bleeding. New dressing applied with hemostasis achieved

## 2024-09-09 NOTE — Evaluation (Signed)
 Occupational Therapy Evaluation Patient Details Name: [name] MRN: [id number] DOB: 29-Jan-1976 Today's Date: 09/09/2024   History of Present Illness   Pt is a 49 y.o. female who presented 09/08/24 with SOB and generalized peripheral edema. Pt admitted with volume overload and new A-fib with RVR. Pt also with kidney transplant failure and questionable false positive pregnancy test. PMH: end-stage kidney disease s/p kidney transplant in 2010, HTN, SVC syndrome, HLD, anemia     Clinical Impressions Pt presents with decline in function and safety with ADLs and ADL mobility with impaired strength, balance and endurance. PTA pt live with significant other and was Ind with ADLs/selfcare, cooks, does not work or drive, used no AD for mobility. Pt currently requires min A with LB selfcare, min A +2 STS/mobility/transfers using RW; pt is limited by fatigue and generally feeling unwell. Pt's HR increasing to 146 during activity, O2 SATs decreasing to 77%. Pt instructed on deep, pursed lip breathing with O2 quickly recovering to 96%, HR decreased to 125 and RN notified. OT will follow acutely to maximize level of function and safety      If plan is discharge home, recommend the following:   A little help with bathing/dressing/bathroom;A little help with walking and/or transfers;Assistance with cooking/housework;Assist for transportation;Help with stairs or ramp for entrance     Functional Status Assessment   Patient has had a recent decline in their functional status and demonstrates the ability to make significant improvements in function in a reasonable and predictable amount of time.     Equipment Recommendations   BSC/3in1     Recommendations for Other Services         Precautions/Restrictions   Precautions Precautions: Fall;Other (comment) Precaution/Restrictions Comments: watch HR and SpO2 Restrictions Weight Bearing Restrictions Per Provider Order: No      Mobility Bed Mobility Overal bed mobility: Needs Assistance Bed Mobility: Supine to Sit, Sit to Supine     Supine to sit: Contact guard, HOB elevated, Used rails Sit to supine: Min assist, HOB elevated, Used rails   General bed mobility comments: CGA for safety exiting R EOB with pt using rails. MinA at legs to lift back into bed with return to supine, but pt likely capable of doing so without assistance but she preferred it this date.    Transfers Overall transfer level: Needs assistance Equipment used: Rolling walker (2 wheels) Transfers: Sit to/from Stand Sit to Stand: Min assist, +2 safety/equipment           General transfer comment: Cued pt to push up from bed but pt chose to pull up on RW with bil UEs instead. MinA, +2 for safety, to power up to stand from EOB.      Balance Overall balance assessment: Needs assistance Sitting-balance support: No upper extremity supported, Feet supported Sitting balance-Leahy Scale: Fair Sitting balance - Comments: able to reach to mid lower legs without LOB, supervision for safety   Standing balance support: Bilateral upper extremity supported, During functional activity, Reliant on assistive device for balance Standing balance-Leahy Scale: Poor                             ADL either performed or assessed with clinical judgement   ADL Overall ADL's : Needs assistance/impaired Eating/Feeding: Independent   Grooming: Wash/dry hands;Wash/dry face;Set up;Supervision/safety;Sitting   Upper Body Bathing: Set up;Supervision/ safety;Sitting   Lower Body Bathing: Minimal assistance   Upper Body Dressing : Set up;Supervision/safety;Sitting  Lower Body Dressing: Minimal assistance   Toilet Transfer: Minimal assistance;Ambulation   Toileting- Clothing Manipulation and Hygiene: Minimal assistance;Sit to/from stand       Functional mobility during ADLs: Minimal assistance;Cueing for safety       Vision Ability to  See in Adequate Light: 0 Adequate Patient Visual Report: No change from baseline       Perception         Praxis         Pertinent Vitals/Pain Pain Assessment Pain Assessment: 0-10 Pain Score: 2  Pain Location: itching in feet Pain Descriptors / Indicators: Other (Comment) (itching, irritation) Pain Intervention(s): Limited activity within patient's tolerance, Monitored during session, Repositioned     Extremity/Trunk Assessment Upper Extremity Assessment Upper Extremity Assessment: Generalized weakness   Lower Extremity Assessment Lower Extremity Assessment: Defer to PT evaluation RLE Deficits / Details: gross MMT scores of 4- to 4; denied numbness/tingling bil; pitting edema score of 1+ on R; wounds noted lower leg LLE Deficits / Details: gross MMT scores of 4- to 4; denied numbness/tingling bil; pitting edema score of 2+ on R; wounds noted lower leg   Cervical / Trunk Assessment Cervical / Trunk Assessment: Normal   Communication Communication Communication: No apparent difficulties   Cognition Arousal: Alert Behavior During Therapy: Flat affect Cognition: No apparent impairments                               Following commands: Intact       Cueing  General Comments   Cueing Techniques: Verbal cues  Pleth unreliable when ambulating, stating pt as low as 77% on RA, but when pleth was reliable while sitting after ambulating it read 95% on RA; HR up to 146 bpm when ambulating, recovered to 120s with seated rest and eventually returned to 90s when supine end of session   Exercises     Shoulder Instructions      Home Living Family/patient expects to be discharged to:: Private residence Living Arrangements: Spouse/significant other Available Help at Discharge: Family;Available 24 hours/day Type of Home: House Home Access: Stairs to enter [employer] of Steps: 3 Entrance Stairs-Rails: Can reach both Home Layout: Two level;1/2 bath on  main level;Bed/bath upstairs;Able to live on main level with bedroom/bathroom Alternate Level Stairs-Number of Steps: 15 Alternate Level Stairs-Rails: Right Bathroom Shower/Tub: Chief Strategy Officer: Standard     Home Equipment: None          Prior Functioning/Environment Prior Level of Function : Independent/Modified Independent             Mobility Comments: No AD; no recent falls ADLs Comments: Ind with ADLs/selcare, cooks, does not work or drive    OT Problem List: Decreased activity tolerance;Impaired balance (sitting and/or standing);Decreased strength;Obesity;Decreased knowledge of use of DME or AE   OT Treatment/Interventions: Self-care/ADL training;Therapeutic exercise;Patient/family education;Therapeutic activities;DME and/or AE instruction      OT Goals(Current goals can be found in the care plan section)   Acute Rehab OT Goals Patient Stated Goal: feel better OT Goal Formulation: With patient Time For Goal Achievement: 09/23/24 Potential to Achieve Goals: Good ADL Goals Pt Will Perform Grooming: with supervision;with set-up;standing Pt Will Perform Upper Body Bathing: with set-up;with modified independence Pt Will Perform Lower Body Bathing: with contact guard assist;with supervision;sit to/from stand Pt Will Perform Upper Body Dressing: with set-up;with modified independence Pt Will Perform Lower Body Dressing: with contact guard assist;with supervision;sit to/from  stand Pt Will Transfer to Toilet: with contact guard assist;with supervision;ambulating Pt Will Perform Toileting - Clothing Manipulation and hygiene: with contact guard assist;with supervision;sit to/from stand Pt Will Perform Tub/Shower Transfer: with contact guard assist;with supervision;ambulating   OT Frequency:  Min 2X/week    Co-evaluation PT/OT/SLP Co-Evaluation/Treatment: Yes Reason for Co-Treatment: For patient/therapist safety;To address functional/ADL  transfers;Other (comment) PT goals addressed during session: Mobility/safety with mobility;Balance;Proper use of DME OT goals addressed during session: Proper use of Adaptive equipment and DME;ADL's and self-care      AM-PAC OT 6 Clicks Daily Activity     Outcome Measure Help from another person eating meals?: None Help from another person taking care of personal grooming?: A Little Help from another person toileting, which includes using toliet, bedpan, or urinal?: A Little Help from another person bathing (including washing, rinsing, drying)?: A Little Help from another person to put on and taking off regular upper body clothing?: A Little Help from another person to put on and taking off regular lower body clothing?: A Little 6 Click Score: 19   End of Session Equipment Utilized During Treatment: Gait belt;Rolling walker (2 wheels)  Activity Tolerance: Patient limited by fatigue;Other (comment) (overall not feeling well per pt report) Patient left:    OT Visit Diagnosis: Muscle weakness (generalized) (M62.81);Other abnormalities of gait and mobility (R26.89)                Time: 8983-8967 OT Time Calculation (min): 16 min Charges:  OT General Charges $OT Visit: 1 Visit OT Evaluation $OT Eval Moderate Complexity: 1 Mod    Jacques Karna Loose 09/09/2024, 11:54 AM

## 2024-09-09 NOTE — Consult Note (Signed)
" °  CLINICAL SUPPORT TEAM - WOUND OSTOMY AND CONTINENCE TEAM  CONSULTATION SERVICES   WOC Nurse-Inpatient Note   WOC Nurse Consult Note: Reason for Consult: Requested to assess multiple wounds. Performed remote evaluation using photos taken by staff members. Pertinent notes was reviewed in order to determinate recommendations. Wound type: Neuropatic Full thickness/partial thickness on lower extremities. Pressure Injury POA: NA Measurement: see nursing flowsheet. Wound bed: 50% pink, 50% superficial yellow slough. Wound on the outer ankle covered by dry black eschar. Drainage (amount, consistency, odor) Small amount, serous. Periwound: Intact. Dressing procedure/placement/frequency: Cleanse with Vashe [id number], not rinse. Apply Xeroform daily to the wound bed, top with silicone foam dressing. Wrap with Kerlix if needed. The foam can stay up to 3 days if not saturated or soiled. Ok to lift, change the Xeroform and reapply.  WOC team will not plan to follow further. Please reconsult if further assistance is needed. Thank-you,  Lela Holm MSN, RN, CWCN, CNS.  (Phone [phone])      "

## 2024-09-09 NOTE — Consult Note (Addendum)
 [facility] Nephrology Consultation Note  Requesting MD: Dr. Donah, Laymon Reason for consult: CKD5/new ESRD  HPI:  [name] is a 49 y.o. female with past medical history significant for obesity, hypertension, anemia, dyslipidemia, SVC syndrome, ESRD status post LRKT in 2010 at [facility] presented with worsening shortness of breath, fluid overload, seen as a consultation for the management of new ESRD. The patient had a kidney transplant done back in 2010 at [facility] and has been following with Dr. Gearline at [facility].  She has high-grade proteinuria suggestive of chronic allograft nephropathy for which seen at the transplant center.  The creatinine level seems to have progressed gradually and she was advised to start dialysis few months ago.  Patient was hesitant at that time.  Transplant kidney ultrasound was normal.  She is on Myfortic  720 mg twice daily and Prograf  3 mg twice daily mg for immunosuppression.  The fluid was managed with oral torsemide as outpatient. She is now presented with worsening shortness of breath, dyspnea on exertion, worsening peripheral edema and overall not feeling well. In the ER, she was afebrile, blood pressure elevated, and room air.  The labs showed BUN 103, creatinine level 7.4,, sodium 131, proBNP more than 35,000, elevated troponin level, iron  saturation of 6%, hemoglobin 7.1.  She was treated with IV diuretics with minimal urine output. The patient said she is feeling nauseated but no vomiting.  Feels weak, exhausted, short of breath and significant weight gain.  Her partner was at the bedside.  She agreed with telemetry catheter placement and start dialysis.  She was already seen by IR and is scheduled for HD catheter today.  Currently on IV heparin  per cardiologist for new onset of A-fib w/RVR.  PMHx:   Past Medical History:  Diagnosis Date   Anemia    Anxiety    GERD  (gastroesophageal reflux disease)    Hyperlipidemia    Hypertension     Past Surgical History:  Procedure Laterality Date   APPENDECTOMY     CESAREAN SECTION     CHOLECYSTECTOMY     KIDNEY TRANSPLANT Right    TUBAL LIGATION     vein ligation     VEIN LIGATION      Family Hx:  Family History  Problem Relation Age of Onset   Cancer Father    Hyperlipidemia Maternal Uncle    Pulmonary fibrosis Maternal Grandmother    Colon cancer Maternal Grandmother     Social History:  reports that she has been smoking cigarettes. She has a 30 pack-year smoking history. She has never used smokeless tobacco. She reports that she does not drink alcohol and does not use drugs.  Allergies: Allergies[1]  Medications: Prior to Admission medications  Medication Sig Start Date End Date Taking? Authorizing Provider  aspirin  81 MG EC tablet Take 81 mg by mouth daily.    Yes [provider]  Buprenorphine  HCl-Naloxone  HCl (ZUBSOLV ) 8.6-2.1 MG SUBL Place 0.5-1 tablets under the tongue See admin instructions. Take 1 tablet, 1 tablet at midday, 0.5 tablet in the evening   Yes [provider]  cloNIDine  (CATAPRES ) 0.2 MG tablet Take 0.2 mg by mouth 3 (three) times daily.   Yes [provider]  clopidogrel  (PLAVIX ) 75 MG tablet Take 75 mg by mouth daily. 03/18/20  Yes [provider]  famotidine  (PEPCID ) 40 MG tablet Take 40 mg by mouth at bedtime. 01/19/20  Yes [provider]  Lidocaine  3 % CREA  SMARTSIG:1 Gram(s) Topical Twice Daily PRN 07/03/24  Yes [provider]  mycophenolate  (MYFORTIC ) 360 MG TBEC Take 720 mg by mouth 2 (two) times daily.     Yes [provider]  ondansetron  (ZOFRAN -ODT) 4 MG disintegrating tablet Take 4 mg by mouth every 6 (six) hours as needed. 06/16/24  Yes [provider]  rosuvastatin  (CRESTOR ) 20 MG tablet Take 20 mg by mouth daily. 04/13/20  Yes [provider]  tacrolimus  (PROGRAF ) 1 MG capsule Take 3  mg by mouth 2 (two) times daily.     Yes [provider]  torsemide (DEMADEX) 20 MG tablet Take 40 mg by mouth daily. 07/12/22  Yes [provider]  triamcinolone (KENALOG) 0.025 % cream Apply 1 Application topically 2 (two) times daily. 07/03/24  Yes [provider]  zolpidem  (AMBIEN ) 10 MG tablet Take 10 mg by mouth at bedtime.   Yes [provider]  VENTOLIN  HFA 108 (90 Base) MCG/ACT inhaler 2 puffs every 6 (six) hours as needed. Patient not taking: Reported on 09/08/2024 07/22/22   [provider]    I have reviewed the patient's current medications.  Labs: Renal Panel: Recent Labs  Lab 09/08/24 1929 09/08/24 1939 09/09/24 0109  NA 131* 131* 131*  K 4.9 4.8 5.0  CL 99 103 98  CO2 15*  --  15*  GLUCOSE 96 95 85  BUN 96* 103* 97*  CREATININE 6.87* 7.40* 6.89*  CALCIUM  8.0*  --  8.0*  MG  --   --  2.0  PHOS  --   --  5.9*     CBC:    Latest Ref Rng & Units 09/09/2024    1:09 AM 09/08/2024    7:39 PM 09/08/2024    7:29 PM  CBC  WBC 4.0 - 10.5 K/uL 9.8   10.3   Hemoglobin 12.0 - 15.0 g/dL 7.1  8.2  7.5   Hematocrit 36.0 - 46.0 % 24.6  24.0  25.7   Platelets 150 - 400 K/uL 343   343      Anemia Panel:  Recent Labs    09/08/24 1929 09/08/24 1939 09/09/24 0109 09/09/24 0309  HGB 7.5* 8.2* 7.1*  --   MCV 79.1*  --  78.1*  --   VITAMINB12  --   --   --  1,346*  FOLATE  --   --   --  9.4  FERRITIN  --   --   --  169  TIBC  --   --   --  239*  IRON   --   --   --  13*  RETICCTPCT  --   --   --  2.1    Recent Labs  Lab 09/09/24 0109  ALBUMIN 2.2*    Lab Results  Component Value Date   HGBA1C  11/05/2009    5.5 (NOTE) The ADA recommends the following therapeutic goal for glycemic control related to Hgb A1c measurement: Goal of therapy: <6.5 Hgb A1c  Reference: American Diabetes Association: Clinical Practice Recommendations 2010, Diabetes Care, 2010, 33: (Suppl  1).    ROS:  Pertinent items noted in HPI and  remainder of comprehensive ROS otherwise negative.  Physical Exam: Vitals:   09/09/24 0736 09/09/24 0905  BP: (!) 162/106 (!) 165/95  Pulse: (!) 129   Resp: 17   Temp: 97.7 F (36.5 C)   SpO2: 99%      General exam: Looks exhausted and tired. Respiratory system: Basal crackles and distant breath  sound. Cardiovascular system: S1 & S2 heard, RRR.  Peripheral edema present++ Gastrointestinal system: Abdomen is  soft and nontender.  Central nervous system: Alert awake and oriented.  Extremities: Pitting edema present, no cyanosis Skin: No rashes, lesions or ulcers Psychiatry: Judgement and insight appear normal. Mood & affect appropriate.   Assessment/Plan:  # New ESRD after failed kidney transplant with evidence of uremia and fluid overload. - Patient agreed to proceed with dialysis. - IR consulted for tunneled HD catheter placement followed by HD today. - Informed renal navigator to arrange outpatient dialysis. - Strict ins and out and monitor lab.  # Kidney transplant LRKT (mother) in 2010 at [facility]: Continue Prograf  and Myfortic  at home dose.  # Acute CHF exacerbation: Elevated BNP, UF with dialysis.  Cardiology is following.  # New onset A-fib with RVR: Currently on heparin  and managing by cardiologist.  # Anemia of CKD: Noted iron  saturation of 6%.  I will order IV iron  and start Aranesp .  # CKD-MBD: Check PTH level, hyperphosphatemia expect to improve with dialysis.  # Hyponatremia, hypervolemia: UF with HD, fluid restriction and monitor lab.  Thank you for the consult, we will continue to follow.  Anissa Abbs Amelie Romney 09/09/2024, 9:25 AM  [facility].       [1]  Allergies Allergen Reactions   Metoclopramide Anxiety and Other (See Comments)   Ciprofloxacin Nausea Only   Morphine Other (See Comments)    Headache /migraine    Vancomycin     gives patient red man syndrome

## 2024-09-09 NOTE — CV Procedure (Signed)
 Interventional Radiology Procedure Note  Procedure: Left hemodialysis tunneled line placement  Complications: None  Estimated Blood Loss: < 10 mL  Findings: Left jugular vein access for placement of a tunneled hemodialysis catheter.  Catheter tip at cavoatrial junction.  The catheter is within the left brachiocephalic /SVC stents and will likely require the patient to remain anticoagulated for as long as the catheter is present.  Cordella DELENA Banner, MD

## 2024-09-09 NOTE — Assessment & Plan Note (Addendum)
-   Nephrology consulted in ED, appreciate recommendations - IR consulted for [facility] with plan to start HD after though this may have to be delayed until A-fib is resolved, currently awaiting rate control for procedure - S/p IV Lasix  80mg  on admission, continue diuresis with HD per nephro - Daily RFP

## 2024-09-09 NOTE — Consult Note (Addendum)
 "  Cardiology Consultation   Patient ID: ADYSEN RAPHAEL MRN: [id number]; DOB: December 01, 1975  Admit date: 09/08/2024 Date of Consult: 09/09/2024  PCP:  Cristopher Suzen HERO, NP   [facility] Providers Cardiologist:  None        Patient Profile: [name] is a 49 y.o. female with a hx of end-stage kidney disease status post kidney transplant in 2010, hypertension, SVC syndrome who is being seen 09/09/2024 for the evaluation of shortness of breath at the request of the general medicine service.  History of Present Illness: [name] has been experiencing ongoing leg swelling over the past few weeks.  She states she also has a racing heart whenever she gets up starts moving.  This prompted her ER visit.  Patient denies chest pain, syncope, dizziness.  Patient has no prior history of atrial fibrillation.  In the past, she has had extensive central venous stenosis with SVC syndrome.  She had venous stents placed in 2015.  She was previously on a blood thinner, however she is now on aspirin  and Plavix .    Regarding her kidney disease, there have been discussions about dialysis for some time.  She still produces urine, she is continue to have weight gain despite increasing her diuretic regimen.  The current plan is to pursue dialysis.  In 2024, patient has had workup for shortness of breath.  CG on 08/29/2022 showed an EF of 55 to 60%, grade 2 diastolic dysfunction, normal RV function with an RVSP of 32, mildly dilated left atrium.  Patient lives in [place].  She has no family history of atrial fibrillation.  Notable vitals include blood pressure 151-178/124-140, in atrial fibrillation with ventricular rates in the 110s to 130s.  Notable labs include proBNP greater than 35,000, high-sensitivity troponin 231-> 132, hemoglobin 8.2, potassium 4.8, hCG positive, TSH 2.5.  This x-ray shows mild pulmonary edema and cardiomegaly.  Past Medical History:  Diagnosis Date   Anemia     Anxiety    GERD (gastroesophageal reflux disease)    Hyperlipidemia    Hypertension     Past Surgical History:  Procedure Laterality Date   APPENDECTOMY     CESAREAN SECTION     CHOLECYSTECTOMY     KIDNEY TRANSPLANT Right    TUBAL LIGATION     vein ligation     VEIN LIGATION       Home Medications:  Prior to Admission medications  Medication Sig Start Date End Date Taking? Authorizing Provider  aspirin  81 MG EC tablet Take 81 mg by mouth daily.    Yes [provider]  Buprenorphine  HCl-Naloxone  HCl (ZUBSOLV ) 8.6-2.1 MG SUBL Place 0.5-1 tablets under the tongue See admin instructions. Take 1 tablet, 1 tablet at midday, 0.5 tablet in the evening   Yes [provider]  cloNIDine  (CATAPRES ) 0.2 MG tablet Take 0.2 mg by mouth 3 (three) times daily.   Yes [provider]  clopidogrel  (PLAVIX ) 75 MG tablet Take 75 mg by mouth daily. 03/18/20  Yes [provider]  famotidine  (PEPCID ) 40 MG tablet Take 40 mg by mouth at bedtime. 01/19/20  Yes [provider]  Lidocaine  3 % CREA SMARTSIG:1 Gram(s) Topical Twice Daily PRN 07/03/24  Yes [provider]  mycophenolate  (MYFORTIC ) 360 MG TBEC Take 720 mg by mouth 2 (two) times daily.     Yes [provider]  ondansetron  (ZOFRAN -ODT) 4 MG disintegrating tablet Take 4 mg by mouth every 6 (six) hours as needed. 06/16/24  Yes  [provider]  rosuvastatin  (CRESTOR ) 20 MG tablet Take 20 mg by mouth daily. 04/13/20  Yes [provider]  tacrolimus  (PROGRAF ) 1 MG capsule Take 3 mg by mouth 2 (two) times daily.     Yes [provider]  torsemide (DEMADEX) 20 MG tablet Take 40 mg by mouth daily. 07/12/22  Yes [provider]  triamcinolone (KENALOG) 0.025 % cream Apply 1 Application topically 2 (two) times daily. 07/03/24  Yes [provider]  zolpidem  (AMBIEN ) 10 MG tablet Take 10 mg by mouth at bedtime.   Yes [provider]  VENTOLIN  HFA 108 (90  Base) MCG/ACT inhaler 2 puffs every 6 (six) hours as needed. Patient not taking: Reported on 09/08/2024 07/22/22   [provider]    Scheduled Meds:  aspirin  EC  81 mg Oral Daily   cloNIDine   0.2 mg Oral TID   famotidine   40 mg Oral QHS   heparin   5,000 Units Subcutaneous Once   mycophenolate   720 mg Oral BID   nystatin  cream   Topical BID   rosuvastatin   20 mg Oral Daily   tacrolimus   3 mg Oral BID   zolpidem   5 mg Oral QHS   Continuous Infusions:  nitroGLYCERIN  35 mcg/min (09/09/24 0034)   PRN Meds: acetaminophen  **OR** acetaminophen   Allergies:   Allergies[1]  Social History:   Social History   Socioeconomic History   Marital status: Significant Other    Spouse name: Not on file   Number of children: Not on file   Years of education: Not on file   Highest education level: Not on file  Occupational History   Not on file  Tobacco Use   Smoking status: Every Day    Current packs/day: 1.00    Average packs/day: 1 pack/day for 30.0 years (30.0 ttl pk-yrs)    Types: Cigarettes   Smokeless tobacco: Never  Substance and Sexual Activity   Alcohol use: No   Drug use: No   Sexual activity: Not on file  Other Topics Concern   Not on file  Social History Narrative   Not on file   Social Drivers of Health   Tobacco Use: High Risk (12/18/2023)   Received from [facility]   Patient History    Smoking Tobacco Use: Smoker, Current Status Unknown    Smokeless Tobacco Use: Never    Passive Exposure: Not on file  Financial Resource Strain: Not on file  Food Insecurity: Low Risk (12/18/2023)   Received from [facility]   Food Insecurity    Within the past 12 months, did the food you bought just not last and you didn't have money to get more?: No    Within the past 12 months, did you worry that your food would run out before you got money to buy more?: No  Transportation Needs: Low Risk (12/18/2023)   Received from [facility]   Transportation Needs    Within the past 12 months, has a lack of transportation kept you from medical appointments or from doing things needed for daily living?: No  Physical Activity: Not on file  Stress: Not on file  Social Connections: Not on file  Intimate Partner Violence: Not on file  Depression (EYV7-0): Not on file  Alcohol Screen: Not on file  Housing: Not on file  Utilities: Low Risk (12/18/2023)   Received from [facility]   Utilities    Within the past 12 months,  have you been unable to get utilities (heat, electricity) when it was really needed?: No  Health Literacy: Not on file    Family History:    Family History  Problem Relation Age of Onset   Cancer Father    Hyperlipidemia Maternal Uncle    Pulmonary fibrosis Maternal Grandmother    Colon cancer Maternal Grandmother      ROS:  Please see the history of present illness.   All other ROS reviewed and negative.     Physical Exam/Data: Vitals:   09/08/24 2300 09/08/24 2324 09/08/24 2348 09/09/24 0032  BP:  (!) 165/146 (!) 151/115 (!) 175/125  Pulse:  (!) 118 79 (!) 131  Resp:   (!) 21 16  Temp:  97.7 F (36.5 C)    TempSrc:  Axillary    SpO2: 100% 100% 100% 100%  Weight:  90.5 kg    Height:  5' 1.5 (1.562 m)     No intake or output data in the 24 hours ending 09/09/24 0108    09/08/2024   11:24 PM 08/29/2022    3:14 PM 05/12/2020    5:00 AM  Last 3 Weights  Weight (lbs) 199 lb 8.3 oz 200 lb 3.2 oz 193 lb 4.8 oz  Weight (kg) 90.5 kg 90.81 kg 87.68 kg     Body mass index is 37.09 kg/m.  General:  Well nourished, well developed, in no acute distress HEENT: normal Neck: no JVD Vascular: No carotid bruits; Distal pulses 2+ bilaterally Cardiac:  normal S1, S2; RRR; no murmur  Lungs:  clear to auscultation bilaterally, no wheezing, rhonchi or rales  Abd: soft, nontender, no hepatomegaly  Ext: no edema Musculoskeletal:  No deformities, BUE and BLE strength normal and  equal Skin: warm and dry  Neuro:  CNs 2-12 intact, no focal abnormalities noted Psych:  Normal affect   EKG:  The EKG was personally reviewed and demonstrates:  AF Telemetry:  Telemetry was personally reviewed and demonstrates: Reviewed  Relevant CV Studies: Reviewed  Laboratory Data: High Sensitivity Troponin:  No results for input(s): TROPONINIHS in the last 720 hours.  Recent Labs  Lab 09/08/24 1929 09/08/24 2218  TRNPT 231* 232*      Chemistry Recent Labs  Lab 09/08/24 1929 09/08/24 1939  NA 131* 131*  K 4.9 4.8  CL 99 103  CO2 15*  --   GLUCOSE 96 95  BUN 96* 103*  CREATININE 6.87* 7.40*  CALCIUM  8.0*  --   GFRNONAA 7*  --   ANIONGAP 18*  --     No results for input(s): PROT, ALBUMIN, AST, ALT, ALKPHOS, BILITOT in the last 168 hours. Lipids No results for input(s): CHOL, TRIG, HDL, LABVLDL, LDLCALC, CHOLHDL in the last 168 hours.  Hematology Recent Labs  Lab 09/08/24 1929 09/08/24 1939  WBC 10.3  --   RBC 3.25*  --   HGB 7.5* 8.2*  HCT 25.7* 24.0*  MCV 79.1*  --   MCH 23.1*  --   MCHC 29.2*  --   RDW 17.0*  --   PLT 343  --    Thyroid  Recent Labs  Lab 09/08/24 2218  TSH 2.530    BNP Recent Labs  Lab 09/08/24 1929  PROBNP >35,000.0*    DDimer No results for input(s): DDIMER in the last 168 hours.  Radiology/Studies:  CT Chest Wo Contrast Result Date: 09/08/2024 EXAM: CT CHEST WITHOUT CONTRAST 09/08/2024 10:57:58 PM TECHNIQUE: CT of the chest was performed without the administration of intravenous  contrast. Multiplanar reformatted images are provided for review. Automated exposure control, iterative reconstruction, and/or weight based adjustment of the mA/kV was utilized to reduce the radiation dose to as low as reasonably achievable. COMPARISON: Comparison with chest radiograph 09/08/2024 and CT chest 11/28/2008. CLINICAL HISTORY: sob Shortness of breath. FINDINGS: MEDIASTINUM: Cardiac enlargement. No pericardial  effusions. Normal caliber thoracic aorta. Mild calcification of the aorta and coronary arteries. Stent graft in the superior vena cava. The central airways are clear. LYMPH NODES: Mediastinal lymphadenopathy with pretracheal nodes measuring up to 1.7 cm short axis dimension. This is nonspecific, possibly reactive or lymphoproliferative change. Follow-up after resolution of acute process is recommended. LUNGS AND PLEURA: Small left and moderate right pleural effusion with bilateral basilar consolidation. This may represent compressive atelectasis and/or pneumonia. Focal infiltrates in the left lingula. Scattered emphysematous changes in the lungs. No pneumothorax. SOFT TISSUES/BONES: Diffuse soft tissue edema throughout the subcutaneous fat. No acute bony abnormalities. UPPER ABDOMEN: No acute abnormalities demonstrated in the visualized upper abdomen. IMPRESSION: 1. Small left and moderate right pleural effusion with bilateral basilar consolidation, possibly representing compressive atelectasis and/or pneumonia. Focal infiltrates in the left lingula. 2. Cardiac enlargement with diffuse soft tissue edema, which can be seen with volume overload. 3. Mediastinal lymphadenopathy with pretracheal nodes measuring up to 1.7 cm short axis dimension, nonspecific, possibly reactive or lymphoproliferative change. Follow-up after resolution of the acute process is recommended. 4. Scattered emphysematous changes in the lungs. Electronically signed by: Elsie Gravely MD 09/08/2024 11:16 PM EST RP Workstation: [id number]   DG Chest 1 View Result Date: 09/08/2024 EXAM: 1 VIEW(S) XRAY OF THE CHEST 09/08/2024 07:26:00 PM COMPARISON: Comparison with 08/29/2022. CLINICAL HISTORY: Shortness of breath. FINDINGS: LINES, TUBES AND DEVICES: Vascular stent in Superior Vena Cava. LUNGS AND PLEURA: Mild pulmonary edema. Bibasilar airspace opacities. Small right pleural effusion. Possible pneumonia. No pneumothorax. HEART AND MEDIASTINUM:  Cardiomegaly. BONES AND SOFT TISSUES: No acute osseous abnormality. IMPRESSION: 1. Mild pulmonary edema with bibasilar airspace opacities and small right pleural effusion, which may represent pneumonia. 2. Cardiomegaly. Electronically signed by: Elsie Gravely MD 09/08/2024 07:32 PM EST RP Workstation: [id number]     Assessment and Plan: ANIA LEVAY is a 49 y.o. female with a hx of end-stage kidney disease status post kidney transplant in 2010, hypertension, SVC syndrome who is being seen for management of AF.  Based on history, patient's atrial fibrillation appears to have started within the last couple of weeks.  She has evidence of diastolic dysfunction on her prior echocardiogram in 2024 and she has mild LA dilation.  Her risk factors for atrial fibrillation include chronic kidney disease and obesity.  Her CHADS2 Vasc score is 2, however kidney disease increases her risk of stroke.  She has markedly elevated proBNP.  That, in conjunction with her fluid overload suspicious for congestive heart failure, however given her kidney disease, it is difficult to ascertain.  Regardless, her fluid overload likely precipitated her atrial fibrillation.  Would pursue aggressive volume removal.  Per primary team, she is going to receive dialysis, likely through a PermCath.  Would primarily focus on rate control.  She is tolerating her atrial fibrillation without any control thus far.  Will start anticoagulation, as she may need a cardioversion for restoration of sinus rhythm.  However, I am hopeful volume removal will help spontaneously convert her back to her normal rhythm.  Will start IV anticoagulation in case radiology team needs to temporarily discontinue for procedure.  Of note, she has B HCG positive. -start  12.5mg  metoprolol  succinate BID -start IV heparin  (ok to hold heparin  for procedure) -diuresis per Nephrology team -if remains in AF despite volume removal, will pursue TEE +  cardioversion -obtain iron  studies  Risk Assessment/Risk Scores:         CHA2DS2-VASc Score =     This indicates a  % annual risk of stroke. The patient's score is based upon:          For questions or updates, please contact [facility] Please consult [email or web address] for contact info under      Signed, Roddy Bellamy A Jalexus Brett, MD  09/09/2024 1:08 AM      [1]  Allergies Allergen Reactions   Metoclopramide Anxiety and Other (See Comments)   Ciprofloxacin Nausea Only   Morphine Other (See Comments)    Headache /migraine    Vancomycin     gives patient red man syndrome   "

## 2024-09-09 NOTE — Progress Notes (Signed)
 Requested to see pt for out-pt HD needs at d/c. Attempted to reach pt via phone but pt did not answer. Will f/u with pt tomorrow morning.   Randine Mungo Dialysis Navigator [phone]

## 2024-09-09 NOTE — Progress Notes (Signed)
 PHARMACY - ANTICOAGULATION CONSULT NOTE  Pharmacy Consult for heparin  Indication: atrial fibrillation  Allergies[1]  Patient Measurements: Height: 5' 1.5 (156.2 cm) Weight: 90.5 kg (199 lb 8.3 oz) IBW/kg (Calculated) : 48.95 HEPARIN  DW (KG): 70  Vital Signs: Temp: 97.4 F (36.3 C) (01/29 0321) Temp Source: Oral (01/29 0321) BP: 155/111 (01/29 0321) Pulse Rate: 156 (01/29 0321)  Labs: Recent Labs    09/08/24 1929 09/08/24 1939 09/09/24 0109  HGB 7.5* 8.2* 7.1*  HCT 25.7* 24.0* 24.6*  PLT 343  --  343  CREATININE 6.87* 7.40* 6.89*    Estimated Creatinine Clearance: 10.6 mL/min (A) (by C-G formula based on SCr of 6.89 mg/dL (H)).   Medical History: Past Medical History:  Diagnosis Date   Anemia    Anxiety    GERD (gastroesophageal reflux disease)    Hyperlipidemia    Hypertension    Assessment: 69 yoF presented with shortness of breath. Pharmacy consulted to dose heparin  for afib.  -Hx of SVC syndrome w/ extensive central venous stenosis s/p venous stent placed in 2015 on DAPT -Hgb 7s (bl~7-8)-anemia of chronic kidney disease, plts WNL -No prior oral anticoagulation   Goal of Therapy:  Heparin  level 0.3-0.7 units/ml Monitor platelets by anticoagulation protocol: Yes   Plan:  -No bolus given Hgb level -Start heparin  infusion at 1050 units/hr -8h heparin  level -F/u plan for HD and plans for cardioversion   Lynwood Poplar, PharmD, BCPS Clinical Pharmacist 09/09/2024 4:03 AM        [1]  Allergies Allergen Reactions   Metoclopramide Anxiety and Other (See Comments)   Ciprofloxacin Nausea Only   Morphine Other (See Comments)    Headache /migraine    Vancomycin     gives patient red man syndrome

## 2024-09-09 NOTE — Progress Notes (Addendum)
 "    Daily Progress Note Intern Pager: [phone]  Patient name: [name] Medical record number: [id number] Date of birth: Sep 12, 1975 Age: 49 y.o. Gender: female  Primary Care Provider: Cristopher Suzen HERO, NP Consultants: Cardiology, Nephrology Code Status: DNR/DNI  Pt Overview and Major Events to Date:  01/28: Admitted for new onset A-fib in the setting of volume overload and ESRD with renal transplant  Assessment and Plan:  [name]. [name] is a 49yo F w/PMH of ESRD w/ kidney transplant 2010, chronic glomerulonephritis, HFpEF, SVC syndrome s/p successful recanalization complicated by SVC tear and cardiac tamponade 2018, admitted for CHF exacerbation and new onset A-fib in the setting of volume overload. Assessment & Plan Atrial fibrillation, new onset (HCC) Volume overload A-fib with rate in 150-160s overnight on tele, this AM stable in 120s.  - Cardiology consulted, appreciate recommendations -  heparin  5000u x1 for Dvt ppx; holding additional anticoagulation given potential TDC placement today -  Discontinue nitroglycerin  gtt - Started on 12.5mg  Lopressor  on admission, increased to 25mg  q6h this AM then given one time additional 25mg  dose for better rate control, increase to 50mg  q6h tonight - Continue BiPAP PRN - F/u echo, CT chest, UA - F/u troponins: discontinue trend, trended flat this AM - Tylenol  650 mg q6hrs PRN - PT/OT to treat - Continuous cardiac monitoring - Strict I&O's, daily weights - Daily Mg, CBC Kidney transplant failure - Nephrology consulted in ED, appreciate recommendations - IR consulted for [facility] with plan to start HD after though this may have to be delayed until A-fib is resolved, currently awaiting rate control for procedure - S/p IV Lasix  80mg  on admission, continue diuresis with HD per nephro - Daily RFP Positive pregnancy test Positive serum pregnancy test though patient is monogamous with a female partner and denies any recent sexual  activity with female partners. She does still have her uterus. - Serum bhcg 27 - Negative TV US  - Most likely sequale of renal disease, repeat serum bhcg in 48hrs (01/31) IDA (iron  deficiency anemia) - Added every other day 325mg  iron   Chronic health problem CAD - continue home ASA 81mg  daily; holding home Plavix  75 mg daily HTN - holding home Clonidine  0.2 mg TID, losartan 50 mg daily, torsemide 20 mg daily, furosemide  80 mg daily PRN pending cardiology recommendations HLD - continue home rosuvastatin  20 mg daily - change to Lipitor 40mg  for renal clearance GERD - continue home Famotidine  40 mg daily - change to 10 mg for renal clearance H/o R kidney transplant - continue formulary equivalent of home mycophenolate  720 mg BID and home tacrolimus  3 mg BID  FEN/GI: NPO except sips with meds PPx: Heparin  gtt Dispo:Home pending clinical improvement . Barriers include cardiac and nephrotic work ups.   Subjective:  Patient was seen and examined at bedside. Partner is at bedside. She states she doesn't feel much better than admission. Complaining of posterior calf itching this morning without apparent rash.   Objective: Temp:  [97.4 F (36.3 C)-98.2 F (36.8 C)] 97.4 F (36.3 C) (01/29 0321) Pulse Rate:  [35-156] 142 (01/29 0558) Resp:  [1-32] 18 (01/29 0558) BP: (149-178)/(94-146) 159/102 (01/29 0558) SpO2:  [89 %-100 %] 98 % (01/29 0558) FiO2 (%):  [60 %] 60 % (01/28 1920) Weight:  [90.5 kg-97.2 kg] 97.2 kg (01/29 0446) Physical Exam: General: morbidly obese female uncomfortable appearing in hospital bed in no acute distress Cardiovascular: irregular irregular, in A-fib on monitor with HR 120s Respiratory: mild rhonchi to bilateral bases, good  respiratory effort, good air movement throughout all lung fields, normal WOB on RA, no wheezes Abdomen: soft, non-tender, non-distended, normoactive BS Extremities: warm and dry, venous insufficiency skin changes to RLE with bandage on LLE, no  obvious rashes, 1+ pitting edema to b/l LE with 1+ pitting edema to hands   Laboratory: Most recent CBC Lab Results  Component Value Date   WBC 9.8 09/09/2024   HGB 7.1 (L) 09/09/2024   HCT 24.6 (L) 09/09/2024   MCV 78.1 (L) 09/09/2024   PLT 343 09/09/2024   Most recent BMP    Latest Ref Rng & Units 09/09/2024    1:09 AM  BMP  Glucose 70 - 99 mg/dL 85   BUN 6 - 20 mg/dL 97   Creatinine 9.55 - 1.00 mg/dL 3.10   Sodium 864 - 854 mmol/L 131   Potassium 3.5 - 5.1 mmol/L 5.0   Chloride 98 - 111 mmol/L 98   CO2 22 - 32 mmol/L 15   Calcium  8.9 - 10.3 mg/dL 8.0    TV US  01/29 IMPRESSION: 1. No intrauterine pregnancy. In the setting of a positive beta HCG, differential considerations include an early pregnancy, complete miscarriage, or an unseen ectopic pregnancy. Obstetric consultation with serial beta hCG and sonographic follow-up should be obtained, as clinically warranted. 2. Cortical thinning and increased echogenicity of the right lower quadrant renal transplant, worrisome for chronic medical renal disease or rejection. No hydronephrosis.  CT Chest wo contrast 01/28 IMPRESSION: 1. Small left and moderate right pleural effusion with bilateral basilar consolidation, possibly representing compressive atelectasis and/or pneumonia. Focal infiltrates in the left lingula. 2. Cardiac enlargement with diffuse soft tissue edema, which can be seen with volume overload. 3. Mediastinal lymphadenopathy with pretracheal nodes measuring up to 1.7 cm short axis dimension, nonspecific, possibly reactive or lymphoproliferative change. Follow-up after resolution of the acute process is recommended. 4. Scattered emphysematous changes in the lungs.  CXR 01/28: IMPRESSION: 1. Mild pulmonary edema with bibasilar airspace opacities and small right pleural effusion, which may represent pneumonia. 2. Cardiomegaly.  Lupie Credit, DO 09/09/2024, 7:20 AM  PGY-1, [facility]  Medicine FPTS Intern pager: [phone], text pages welcome Secure chat group [facility] Teaching Service   "

## 2024-09-09 NOTE — Progress Notes (Signed)
 PHARMACY - ANTICOAGULATION CONSULT NOTE  Pharmacy Consult for heparin  Indication: atrial fibrillation  Allergies[1]  Patient Measurements: Height: 5' 1.5 (156.2 cm) Weight: 97.2 kg (214 lb 4.6 oz) IBW/kg (Calculated) : 48.95 HEPARIN  DW (KG): 70  Vital Signs: Temp: 97.4 F (36.3 C) (01/29 1513) Temp Source: Axillary (01/29 1513) BP: 125/90 (01/29 1513) Pulse Rate: 71 (01/29 1513)  Labs: Recent Labs    09/08/24 1929 09/08/24 1939 09/09/24 0109 09/09/24 1048  HGB 7.5* 8.2* 7.1*  --   HCT 25.7* 24.0* 24.6*  --   PLT 343  --  343  --   HEPARINUNFRC  --   --   --  <0.10*  CREATININE 6.87* 7.40* 6.89*  --     Estimated Creatinine Clearance: 11 mL/min (A) (by C-G formula based on SCr of 6.89 mg/dL (H)).   Medical History: Past Medical History:  Diagnosis Date   Anemia    Anxiety    GERD (gastroesophageal reflux disease)    Hyperlipidemia    Hypertension    Assessment: 63 yoF presented with shortness of breath. Pharmacy consulted to dose heparin  for afib.  -Hx of SVC syndrome w/ extensive central venous stenosis s/p venous stent placed in 2015 on DAPT -Hgb 7s (bl~7-8)-anemia of chronic kidney disease, plts WNL -No prior oral anticoagulation   Heparin  held 1400-1600 for HD catheter placement in IR. Dr. Jenna from IR  - ok to restart heparin  at 1600.  Goal of Therapy:  Heparin  level 0.3-0.7 units/ml Monitor platelets by anticoagulation protocol: Yes   Plan:  Heparin  restarted at 1350 units/hr F/u 8 heparin  level post restart  Vito Ralph, PharmD, BCPS Please see amion for complete clinical pharmacist phone list 09/09/2024 3:18 PM          [1]  Allergies Allergen Reactions   Metoclopramide Anxiety and Other (See Comments)   Ciprofloxacin Nausea Only   Morphine Other (See Comments)    Headache /migraine    Vancomycin     gives patient red man syndrome

## 2024-09-09 NOTE — Progress Notes (Signed)
 Patient stated she was breather better and was not short of breath anymore. Placed patient on 6 lpm salter cannula.

## 2024-09-09 NOTE — Progress Notes (Signed)
 Heart Failure Navigator Progress Note  Assessed for Heart & Vascular TOC clinic readiness.  Patient does not meet criteria due to per MD patient will be starting Hemodialysis. No HF TOC. .   Navigator will sign off at this time.    Stephane Haddock, BSN, Scientist, Clinical (histocompatibility And Immunogenetics) Only

## 2024-09-09 NOTE — Hospital Course (Addendum)
 [name] is a 49 y.o.female with history of R kidney transplant, CAD, HTN, HLD, GERD, anemia who was admitted to the [facility] at [facility] for volume overload 2/2 kidney transplant failure and new A-fib with RVR. Her hospital course is outlined below by problem:  A-Fib with RVR Volume overload Patient initially presented with worsening SOB and generalized edema. BP initially elevated to 173/140 and HR 107. EKG showing new A-fib. Labs significant pro-BNP > 35,000 and troponin 231. Patient was given duonebs x1, Lasix  40mg  x2 and started on a nitroglycerin  drip and BiPAP. Echo on 1/30 with evidence of newly reduced LVEF of 30-35%, G2DD, right ventricular systolic function slightly reduced, RV size moderately enlarged, moderately elevated pulmonary arterial pressure.  Cardiology was consulted and recommended starting patient on heparin  drip for afib, considering discontinuing aspirin  on discharge given chronic anemia. Also recommended VVS consult for need of antiplatelet given SVC stent. Cardiology believed elevated troponin to be in the setting of her renal failure and not ischemic given no rise. Patient was also treated with dialysis for volume overload as well. Patient was transitioned to eliquis  5 mg BID on 2/1 and ASA and plavix  were discontinued as per VVS rec. Patient's heart rate and rhythm remained stable at time of discharge.   Kidney transplant failure S/p R kidney transplant that is now failing. Cr 6.87 though unknown baseline. Recommended to initiate dialysis in December 2025 but declined at that time.  Nephrology consulted. Patient had TDC placed and started dialysis on 1/29. She was continued on prograf  and myfortic . Patient and her partner endorsing hallucinations. On 2/3, Nephrology weaned patient's Myfortic  from 3 mg BID to 1 mg BID as it can cause neurologic abnormalities at high levels. A level was obtained which was pending at time of discharge.   Anemia   Thought to be due to anemia of chronic disease in the setting of renal failure. Patient was given IV iron  and started on aranesp . Received 1 unit pRBC while in hospital. Hemoglobin remained stable at time of discharge.   Positive pregnancy test Patient with positive serum pregnancy test. Patient reported monogamous with a female partner so obtained quantitative hCG which was also positive. Transvaginal ultra-sound showed no significant findings. Beta hcg was monitored and trended down decreasing likelihood of miscarriage or ectopic pregnancy. Beta hcg thought to be sequelae of renal disease.  R foot swelling  Noted acute swelling and tenderness of R foot during admission. DVT US  negative. Thought to be gout flare, started on prednisone  course with improvement.   Other chronic conditions were medically managed with home medications and formulary alternatives as necessary (CAD (home plavix  was held due to anemia), HTN, HLD (rosuvastatin  was switched to lipitor 40 for renal clearance), GERD (famotidine  reduced to 10 mg for renal clearance))  PCP Follow-up recommendations: F/u CT chest once recovered from current presentation to evaluate mediastinal LAD Consider long term anticoagulation to prevent thrombosing of tunneled cath given prior SVC syndrome/central venous stenosis.  Per cardiology: with new reduction in EF, limited options for GDMT given renal failure. Repeat echo in 3 months as outpatient. If persistently reduced, then would pursue cath for ischemic workup as she is now on dialysis.

## 2024-09-09 NOTE — Consult Note (Signed)
 "  Chief Complaint: ESRD requiring hemodialysis - IR consulted for tunneled HD catheter placement  Referring Provider(s): Macel, Jayson PARAS, MD   Supervising Physician: Jennefer Rover  Patient Status: [facility] - In-pt  History of Present Illness: [name] is a 49 y.o. female with hx of ESRD, s/p renal transplant in 2010 that is unfortunately failing, SVC syndrome with extensive central venous stenosis s/p venous stenting in 2015. Currently admitted since yesterday with CHF exacerbation and worsening kidney function. Pt has been following with nephrology outpatient, last seen in December 2025 where dialysis was recommended, but pt refused at that time. Given need of current admission, dialysis now being pursued and IR consulted for tunneled hd catheter placement.   Case and imaging reviewed with IR attending Dr. Jennefer. Given hx of SVC syndrome with central venous stenosis, will look to see if left internal jugular is accessible with US . If not, will require groin placement. Discussed this plan with patient, who voices understanding and agreement. All questions answered.   DNR/DNI: If pulseless and not breathing No CPR or chest compressions.  In Pre-Arrest Conditions (Patient Is Breathing and Has A Pulse) Do not intubate. Provide all appropriate non-invasive medical interventions. Avoid ICU transfer unless indicated or required.    Patient currently has DNR order in place. Discussion with the patient and family regarding wishes.  The original DNR order is maintained during the procedure and prior treatment limitations are upheld during the procedure.   Past Medical History:  Diagnosis Date   Anemia    Anxiety    GERD (gastroesophageal reflux disease)    Hyperlipidemia    Hypertension     Past Surgical History:  Procedure Laterality Date   APPENDECTOMY     CESAREAN SECTION     CHOLECYSTECTOMY     KIDNEY TRANSPLANT Right    TUBAL LIGATION     vein ligation     VEIN  LIGATION      Allergies: Metoclopramide, Ciprofloxacin, Morphine, and Vancomycin  Medications: Prior to Admission medications  Medication Sig Start Date End Date Taking? Authorizing Provider  aspirin  81 MG EC tablet Take 81 mg by mouth daily.    Yes [provider]  Buprenorphine  HCl-Naloxone  HCl (ZUBSOLV ) 8.6-2.1 MG SUBL Place 0.5-1 tablets under the tongue See admin instructions. Take 1 tablet, 1 tablet at midday, 0.5 tablet in the evening   Yes [provider]  cloNIDine  (CATAPRES ) 0.2 MG tablet Take 0.2 mg by mouth 3 (three) times daily.   Yes [provider]  clopidogrel  (PLAVIX ) 75 MG tablet Take 75 mg by mouth daily. 03/18/20  Yes [provider]  famotidine  (PEPCID ) 40 MG tablet Take 40 mg by mouth at bedtime. 01/19/20  Yes [provider]  Lidocaine  3 % CREA SMARTSIG:1 Gram(s) Topical Twice Daily PRN 07/03/24  Yes [provider]  mycophenolate  (MYFORTIC ) 360 MG TBEC Take 720 mg by mouth 2 (two) times daily.     Yes [provider]  ondansetron  (ZOFRAN -ODT) 4 MG disintegrating tablet Take 4 mg by mouth every 6 (six) hours as needed. 06/16/24  Yes [provider]  rosuvastatin  (CRESTOR ) 20 MG tablet Take 20 mg by mouth daily. 04/13/20  Yes [provider]  tacrolimus  (PROGRAF ) 1 MG capsule Take 3 mg by mouth 2 (two) times daily.     Yes [provider]  torsemide (DEMADEX) 20 MG tablet Take 40 mg by mouth daily. 07/12/22  Yes [provider]  triamcinolone (KENALOG) 0.025 % cream Apply 1  Application topically 2 (two) times daily. 07/03/24  Yes [provider]  zolpidem  (AMBIEN ) 10 MG tablet Take 10 mg by mouth at bedtime.   Yes [provider]  VENTOLIN  HFA 108 (90 Base) MCG/ACT inhaler 2 puffs every 6 (six) hours as needed. Patient not taking: Reported on 09/08/2024 07/22/22   [provider]     Family History  Problem Relation Age of Onset   Cancer Father     Hyperlipidemia Maternal Uncle    Pulmonary fibrosis Maternal Grandmother    Colon cancer Maternal Grandmother     Social History   Socioeconomic History   Marital status: Significant Other    Spouse name: Not on file   Number of children: Not on file   Years of education: Not on file   Highest education level: Not on file  Occupational History   Not on file  Tobacco Use   Smoking status: Every Day    Current packs/day: 1.00    Average packs/day: 1 pack/day for 30.0 years (30.0 ttl pk-yrs)    Types: Cigarettes   Smokeless tobacco: Never  Substance and Sexual Activity   Alcohol use: No   Drug use: No   Sexual activity: Not on file  Other Topics Concern   Not on file  Social History Narrative   Not on file   Social Drivers of Health   Tobacco Use: High Risk (12/18/2023)   Received from [facility]   Patient History    Smoking Tobacco Use: Smoker, Current Status Unknown    Smokeless Tobacco Use: Never    Passive Exposure: Not on file  Financial Resource Strain: Not on file  Food Insecurity: No Food Insecurity (09/09/2024)   Epic    Worried About Programme Researcher, Broadcasting/film/video in the Last Year: Never true    Ran Out of Food in the Last Year: Never true  Transportation Needs: No Transportation Needs (09/09/2024)   Epic    Lack of Transportation (Medical): No    Lack of Transportation (Non-Medical): No  Physical Activity: Not on file  Stress: Not on file  Social Connections: Not on file  Depression (EYV7-0): Not on file  Alcohol Screen: Not on file  Housing: Low Risk (09/09/2024)   Epic    Unable to Pay for Housing in the Last Year: No    Number of Times Moved in the Last Year: 0    Homeless in the Last Year: No  Utilities: Not At Risk (09/09/2024)   Epic    Threatened with loss of utilities: No  Health Literacy: Not on file    Review of Systems: A 12 point ROS discussed and pertinent positives are indicated in the HPI above.  All other systems are  negative.  Vital Signs: BP (!) 159/102 (BP Location: Left Arm)   Pulse (!) 142   Temp (!) 97.4 F (36.3 C) (Oral)   Resp 18   Ht 5' 1.5 (1.562 m)   Wt 214 lb 4.6 oz (97.2 kg)   SpO2 98%   BMI 39.83 kg/m   Advance Care Plan: No documents on file  Physical Exam Vitals and nursing note reviewed.  Constitutional:      Appearance: Normal appearance.  HENT:     Mouth/Throat:     Mouth: Mucous membranes are moist.     Pharynx: Oropharynx is clear.  Cardiovascular:     Rate and Rhythm: Normal rate.  Pulmonary:     Effort: Pulmonary effort is normal.  Breath sounds: Normal breath sounds.  Abdominal:     Palpations: Abdomen is soft.     Tenderness: There is no abdominal tenderness.  Skin:    General: Skin is warm and dry.  Neurological:     Mental Status: She is alert and oriented to person, place, and time. Mental status is at baseline.     Imaging: CT Chest Wo Contrast Result Date: 09/08/2024 EXAM: CT CHEST WITHOUT CONTRAST 09/08/2024 10:57:58 PM TECHNIQUE: CT of the chest was performed without the administration of intravenous contrast. Multiplanar reformatted images are provided for review. Automated exposure control, iterative reconstruction, and/or weight based adjustment of the mA/kV was utilized to reduce the radiation dose to as low as reasonably achievable. COMPARISON: Comparison with chest radiograph 09/08/2024 and CT chest 11/28/2008. CLINICAL HISTORY: sob Shortness of breath. FINDINGS: MEDIASTINUM: Cardiac enlargement. No pericardial effusions. Normal caliber thoracic aorta. Mild calcification of the aorta and coronary arteries. Stent graft in the superior vena cava. The central airways are clear. LYMPH NODES: Mediastinal lymphadenopathy with pretracheal nodes measuring up to 1.7 cm short axis dimension. This is nonspecific, possibly reactive or lymphoproliferative change. Follow-up after resolution of acute process is recommended. LUNGS AND PLEURA: Small left and  moderate right pleural effusion with bilateral basilar consolidation. This may represent compressive atelectasis and/or pneumonia. Focal infiltrates in the left lingula. Scattered emphysematous changes in the lungs. No pneumothorax. SOFT TISSUES/BONES: Diffuse soft tissue edema throughout the subcutaneous fat. No acute bony abnormalities. UPPER ABDOMEN: No acute abnormalities demonstrated in the visualized upper abdomen. IMPRESSION: 1. Small left and moderate right pleural effusion with bilateral basilar consolidation, possibly representing compressive atelectasis and/or pneumonia. Focal infiltrates in the left lingula. 2. Cardiac enlargement with diffuse soft tissue edema, which can be seen with volume overload. 3. Mediastinal lymphadenopathy with pretracheal nodes measuring up to 1.7 cm short axis dimension, nonspecific, possibly reactive or lymphoproliferative change. Follow-up after resolution of the acute process is recommended. 4. Scattered emphysematous changes in the lungs. Electronically signed by: Elsie Gravely MD 09/08/2024 11:16 PM EST RP Workstation: [id number]   DG Chest 1 View Result Date: 09/08/2024 EXAM: 1 VIEW(S) XRAY OF THE CHEST 09/08/2024 07:26:00 PM COMPARISON: Comparison with 08/29/2022. CLINICAL HISTORY: Shortness of breath. FINDINGS: LINES, TUBES AND DEVICES: Vascular stent in Superior Vena Cava. LUNGS AND PLEURA: Mild pulmonary edema. Bibasilar airspace opacities. Small right pleural effusion. Possible pneumonia. No pneumothorax. HEART AND MEDIASTINUM: Cardiomegaly. BONES AND SOFT TISSUES: No acute osseous abnormality. IMPRESSION: 1. Mild pulmonary edema with bibasilar airspace opacities and small right pleural effusion, which may represent pneumonia. 2. Cardiomegaly. Electronically signed by: Elsie Gravely MD 09/08/2024 07:32 PM EST RP Workstation: [id number]    Labs:  CBC: Recent Labs    09/08/24 1929 09/08/24 1939 09/09/24 0109  WBC 10.3  --  9.8  HGB 7.5* 8.2* 7.1*   HCT 25.7* 24.0* 24.6*  PLT 343  --  343    COAGS: No results for input(s): INR, APTT in the last 8760 hours.  BMP: Recent Labs    09/08/24 1929 09/08/24 1939 09/09/24 0109  NA 131* 131* 131*  K 4.9 4.8 5.0  CL 99 103 98  CO2 15*  --  15*  GLUCOSE 96 95 85  BUN 96* 103* 97*  CALCIUM  8.0*  --  8.0*  CREATININE 6.87* 7.40* 6.89*  GFRNONAA 7*  --  7*    LIVER FUNCTION TESTS: Recent Labs    09/09/24 0109  ALBUMIN 2.2*    TUMOR MARKERS: No results for input(s): AFPTM,  CEA, CA199, CHROMGRNA in the last 8760 hours.  Assessment and Plan:  [name] is a 49 y.o. female with hx of ESRD, s/p renal transplant in 2010 that is unfortunately failing, SVC syndrome with extensive central venous stenosis s/p venous stenting in 2015. Currently admitted since yesterday with CHF exacerbation and worsening kidney function. Pt has been following with nephrology outpatient, last seen in Decemeber 2025 where dialysis was recommended, but pt refused at that time. Given need of current admission, dialysis now being pursued and IR consulted for tunneled hd catheter placement.   ESRD requiring hemodialysis - IR planning tunneled hd catheter placement - Pt has been NPO since midnight -  HR now controlled in 70s - hcg is positive, pt aware of potential risk involved. States she has no pregnancy concerns despite positive test. Pt signed waiver acknowledging potential risk of radiation and contrast to pregnancy.   Risks and benefits discussed with the patient including, but not limited to bleeding, infection, vascular injury, pneumothorax which may require chest tube placement, air embolism or even death. All of the patient's questions were answered, patient is agreeable to proceed. Consent signed and in chart.   Thank you for allowing our service to participate in ROSABEL SERMENO 's care.  Electronically Signed: Kimble VEAR Clas, PA-C   09/09/2024, 8:44 AM      I  spent a total of 20 Minutes    in face to face in clinical consultation, greater than 50% of which was counseling/coordinating care for tunneled hemodialysis catheter placement   "

## 2024-09-10 ENCOUNTER — Inpatient Hospital Stay (HOSPITAL_COMMUNITY)

## 2024-09-10 ENCOUNTER — Other Ambulatory Visit: Payer: Self-pay

## 2024-09-10 ENCOUNTER — Telehealth (HOSPITAL_COMMUNITY): Payer: Self-pay

## 2024-09-10 ENCOUNTER — Other Ambulatory Visit (HOSPITAL_COMMUNITY): Payer: Self-pay

## 2024-09-10 DIAGNOSIS — L259 Unspecified contact dermatitis, unspecified cause: Secondary | ICD-10-CM | POA: Insufficient documentation

## 2024-09-10 DIAGNOSIS — L8990 Pressure ulcer of unspecified site, unspecified stage: Secondary | ICD-10-CM | POA: Insufficient documentation

## 2024-09-10 DIAGNOSIS — I5041 Acute combined systolic (congestive) and diastolic (congestive) heart failure: Secondary | ICD-10-CM

## 2024-09-10 DIAGNOSIS — I2510 Atherosclerotic heart disease of native coronary artery without angina pectoris: Secondary | ICD-10-CM | POA: Diagnosis not present

## 2024-09-10 DIAGNOSIS — I4891 Unspecified atrial fibrillation: Secondary | ICD-10-CM

## 2024-09-10 DIAGNOSIS — I509 Heart failure, unspecified: Secondary | ICD-10-CM | POA: Diagnosis not present

## 2024-09-10 DIAGNOSIS — Z940 Kidney transplant status: Secondary | ICD-10-CM | POA: Diagnosis not present

## 2024-09-10 DIAGNOSIS — K5900 Constipation, unspecified: Secondary | ICD-10-CM | POA: Insufficient documentation

## 2024-09-10 DIAGNOSIS — I5021 Acute systolic (congestive) heart failure: Secondary | ICD-10-CM | POA: Insufficient documentation

## 2024-09-10 DIAGNOSIS — E8779 Other fluid overload: Secondary | ICD-10-CM | POA: Diagnosis not present

## 2024-09-10 LAB — RENAL FUNCTION PANEL
Albumin: 2 g/dL — ABNORMAL LOW (ref 3.5–5.0)
Anion gap: 14 (ref 5–15)
BUN: 77 mg/dL — ABNORMAL HIGH (ref 6–20)
CO2: 18 mmol/L — ABNORMAL LOW (ref 22–32)
Calcium: 7.6 mg/dL — ABNORMAL LOW (ref 8.9–10.3)
Chloride: 98 mmol/L (ref 98–111)
Creatinine, Ser: 5.47 mg/dL — ABNORMAL HIGH (ref 0.44–1.00)
GFR, Estimated: 9 mL/min — ABNORMAL LOW
Glucose, Bld: 90 mg/dL (ref 70–99)
Phosphorus: 5.1 mg/dL — ABNORMAL HIGH (ref 2.5–4.6)
Potassium: 4.3 mmol/L (ref 3.5–5.1)
Sodium: 130 mmol/L — ABNORMAL LOW (ref 135–145)

## 2024-09-10 LAB — CBC
HCT: 22.8 % — ABNORMAL LOW (ref 36.0–46.0)
HCT: 22.7 % — ABNORMAL LOW (ref 36.0–46.0)
Hemoglobin: 6.9 g/dL — CL (ref 12.0–15.0)
Hemoglobin: 6.7 g/dL — CL (ref 12.0–15.0)
MCH: 22.9 pg — ABNORMAL LOW (ref 26.0–34.0)
MCH: 22.9 pg — ABNORMAL LOW (ref 26.0–34.0)
MCHC: 30.3 g/dL (ref 30.0–36.0)
MCHC: 29.5 g/dL — ABNORMAL LOW (ref 30.0–36.0)
MCV: 75.7 fL — ABNORMAL LOW (ref 80.0–100.0)
MCV: 77.7 fL — ABNORMAL LOW (ref 80.0–100.0)
Platelets: 320 10*3/uL (ref 150–400)
Platelets: 291 10*3/uL (ref 150–400)
RBC: 3.01 MIL/uL — ABNORMAL LOW (ref 3.87–5.11)
RBC: 2.92 MIL/uL — ABNORMAL LOW (ref 3.87–5.11)
RDW: 16.9 % — ABNORMAL HIGH (ref 11.5–15.5)
RDW: 17 % — ABNORMAL HIGH (ref 11.5–15.5)
WBC: 6.9 10*3/uL (ref 4.0–10.5)
WBC: 7.6 10*3/uL (ref 4.0–10.5)
nRBC: 0.3 % — ABNORMAL HIGH (ref 0.0–0.2)
nRBC: 0.4 % — ABNORMAL HIGH (ref 0.0–0.2)

## 2024-09-10 LAB — MISC LABCORP TEST (SEND OUT): Labcorp test code: 83935

## 2024-09-10 LAB — HEPARIN LEVEL (UNFRACTIONATED)
Heparin Unfractionated: 0.31 [IU]/mL (ref 0.30–0.70)
Heparin Unfractionated: 0.16 [IU]/mL — ABNORMAL LOW (ref 0.30–0.70)
Heparin Unfractionated: 0.2 [IU]/mL — ABNORMAL LOW (ref 0.30–0.70)

## 2024-09-10 LAB — MAGNESIUM: Magnesium: 1.9 mg/dL (ref 1.7–2.4)

## 2024-09-10 LAB — HEMOGLOBIN AND HEMATOCRIT, BLOOD
HCT: 27.6 % — ABNORMAL LOW (ref 36.0–46.0)
Hemoglobin: 8.3 g/dL — ABNORMAL LOW (ref 12.0–15.0)

## 2024-09-10 LAB — ECHOCARDIOGRAM COMPLETE
Area-P 1/2: 4.39 cm2
Calc EF: 38.8 %
Height: 61.5 in
P 1/2 time: 107 ms
S' Lateral: 3.6 cm
Single Plane A2C EF: 39.3 %
Single Plane A4C EF: 40.3 %
Weight: 3439.18 [oz_av]

## 2024-09-10 LAB — HEPATITIS B SURFACE ANTIGEN: Hepatitis B Surface Ag: NONREACTIVE

## 2024-09-10 LAB — PREPARE RBC (CROSSMATCH)

## 2024-09-10 MED ORDER — SODIUM CHLORIDE 0.9% IV SOLUTION: Freq: Once | INTRAVENOUS | Status: DC

## 2024-09-10 MED ORDER — POLYETHYLENE GLYCOL 3350 17 G PO PACK: 17.0000 g | PACK | Freq: Every day | ORAL | Status: DC

## 2024-09-10 MED ORDER — MAGNESIUM SULFATE 2 GM/50ML IV SOLN
2.0000 g | Freq: Once | INTRAVENOUS | Status: AC
  Administered 2024-09-10: 2 g via INTRAVENOUS
  Filled 2024-09-10: qty 50

## 2024-09-10 MED ORDER — METOPROLOL TARTRATE 25 MG PO TABS
25.0000 mg | ORAL_TABLET | Freq: Two times a day (BID) | ORAL | Status: DC
  Administered 2024-09-10 – 2024-09-13 (×4): 25 mg via ORAL
  Filled 2024-09-10 (×8): qty 1

## 2024-09-10 MED ORDER — TRIAMCINOLONE ACETONIDE 0.1 % EX CREA
1.0000 | TOPICAL_CREAM | Freq: Three times a day (TID) | CUTANEOUS | Status: AC
  Administered 2024-09-10 – 2024-09-17 (×18): 1 via TOPICAL
  Filled 2024-09-10: qty 15

## 2024-09-10 MED ORDER — SENNA 8.6 MG PO TABS
1.0000 | ORAL_TABLET | Freq: Every day | ORAL | Status: AC
  Administered 2024-09-10 – 2024-09-17 (×8): 8.6 mg via ORAL
  Filled 2024-09-10 (×8): qty 1

## 2024-09-10 MED ORDER — POLYETHYLENE GLYCOL 3350 17 G PO PACK
17.0000 g | PACK | Freq: Every day | ORAL | Status: AC
  Administered 2024-09-11 – 2024-09-17 (×3): 17 g via ORAL
  Filled 2024-09-10 (×9): qty 1

## 2024-09-10 MED ORDER — LIDOCAINE 5 % EX PTCH
1.0000 | MEDICATED_PATCH | CUTANEOUS | Status: AC
  Administered 2024-09-10 – 2024-09-17 (×8): 1 via TRANSDERMAL
  Filled 2024-09-10 (×9): qty 1

## 2024-09-10 MED ORDER — CHLORHEXIDINE GLUCONATE CLOTH 2 % EX PADS: 6.0000 | MEDICATED_PAD | Freq: Every day | CUTANEOUS | Status: DC

## 2024-09-10 NOTE — Progress Notes (Addendum)
 PHARMACY - ANTICOAGULATION CONSULT NOTE  Pharmacy Consult for heparin  Indication: atrial fibrillation  Allergies[1]  Patient Measurements: Height: 5' 1.5 (156.2 cm) Weight: 97.5 kg (214 lb 15.2 oz) IBW/kg (Calculated) : 48.95 HEPARIN  DW (KG): 70  Vital Signs: Temp: 97.6 F (36.4 C) (01/30 0300) Temp Source: Axillary (01/30 0300) BP: 136/76 (01/30 0802) Pulse Rate: 78 (01/30 0802)  Labs: Recent Labs    09/08/24 1939 09/09/24 0109 09/09/24 1048 09/10/24 0440 09/10/24 1053  HGB 8.2* 7.1*  --  6.9* 6.7*  HCT 24.0* 24.6*  --  22.8* 22.7*  PLT  --  343  --  320 291  HEPARINUNFRC  --   --  <0.10* 0.31 0.16*  CREATININE 7.40* 6.89*  --  5.47*  --     Estimated Creatinine Clearance: 13.9 mL/min (A) (by C-G formula based on SCr of 5.47 mg/dL (H)).   Medical History: Past Medical History:  Diagnosis Date   Anemia    Anxiety    GERD (gastroesophageal reflux disease)    Hyperlipidemia    Hypertension    Assessment: 69 yoF presented with shortness of breath. Pharmacy consulted to dose heparin  for afib.  -Hx of SVC syndrome w/ extensive central venous stenosis s/p venous stent placed in 2015 on DAPT -Hgb 7s (bl~7-8)-anemia of chronic kidney disease, plts WNL -No prior oral anticoagulation   01/30 AM: heparin  level subtherapuetic on 1350 units/hr. Per RN,no signs/symptoms of bleeding. CBC shows Hgb dipped below 7 (primary team aware) and plts WNL. Patient did receive 1U PRBC today prior to heparin  level being drawn.  Goal of Therapy:  Heparin  level 0.3-0.7 units/ml Monitor platelets by anticoagulation protocol: Yes   Plan:  Increase heparin  1500 units/hr F/u 8 confirmatory heparin  level  Daily Anti-Xa and CBC Continue to monitor for s/sx of bleeding  R. Samual Satterfield, PharmD, RPh PGY-1 Acute Care Pharmacy Resident [facility] Please refer to [facility] for [facility] Pharmacy numbers 09/10/2024 11:42 AM        [1]  Allergies Allergen Reactions    Metoclopramide Anxiety and Other (See Comments)   Ciprofloxacin Nausea Only   Morphine Other (See Comments)    Headache /migraine    Vancomycin     gives patient red man syndrome

## 2024-09-10 NOTE — Assessment & Plan Note (Signed)
 Pruritic posterior lower legs since admission, today with vesicular rash R >L LE. - Benadryl  cream at bedside, not helping much  - Trial triamcinolone  cream 0.1% to affected areas

## 2024-09-10 NOTE — Progress Notes (Signed)
 [facility] consult. Assessed bilateral arms for PIV placement. No appropriate vein found. Veins to deep or small. Notified RN at [facility]. Powell Bowler, RN [facility]

## 2024-09-10 NOTE — Progress Notes (Addendum)
 [name] NEPHROLOGY PROGRESS NOTE  Assessment/ Plan: Pt is a 49 y.o. yo female  with past medical history significant for obesity, hypertension, anemia, dyslipidemia, SVC syndrome, ESRD status post LRKT in 2010 at [facility] presented with worsening shortness of breath, fluid overload, seen as a consultation for the management of new ESRD.   # New ESRD after failed kidney transplant with evidence of uremia and fluid overload. -s/p LIJ TDC by IR on 1/29 and received first dialysis.  Renal navigator is following to arrange outpatient HD for ESRD. Pt said she had failed fistulas in her arm in the past. -Tolerated well dialysis yesterday, plan for second HD today.  Continue to increase UF goal with dialysis.   # Kidney transplant LRKT (mother) in 2010 at [facility]: Continue Prograf  and Myfortic  at home dose.   # Acute CHF exacerbation: Elevated BNP, UF with dialysis.  Cardiology is following.   # New onset A-fib with RVR: Currently on heparin  and managing by cardiologist.   # Anemia of CKD: Noted iron  saturation of 6%.  Ordered IV iron  and started Aranesp .  Receiving a unit of blood transfusion.   # CKD-MBD: Check PTH level, hyperphosphatemia expect to improve with dialysis.   # Hyponatremia, hypervolemia: UF with HD, fluid restriction and monitor lab.  Addendum 1:45 Pm: I was asked to comment on the placement of PICC line.  The patient with new ESRD, reportedly had failed fistula in the arm.  Details unknown about previous dialysis access at this time.  Currently receiving dialysis via tunneled HD catheter.  My recommendation would be to place peripheral line or central line but to avoid PICC line or midline in arms.  However if it is emergency, unable to put a central line  and in need of line for the treatment then the benefit of placing PICC or midline outweighs the risk.   Subjective: Seen and examined at bedside.  Tolerated dialysis well  yesterday with UF around 0.5 L.  SOB.  No chest pain.  Plan for second dialysis today. Objective Vital signs in last 24 hours: Vitals:   09/10/24 0300 09/10/24 0500 09/10/24 0600 09/10/24 0802  BP: 119/62  131/85 136/76  Pulse: 71  65 78  Resp: (!) 8  12 12   Temp: 97.6 F (36.4 C)     TempSrc: Axillary     SpO2: 98%  99% 100%  Weight:  97.5 kg    Height:       Weight change: 7 kg  Intake/Output Summary (Last 24 hours) at 09/10/2024 1032 Last data filed at 09/10/2024 0600 Gross per 24 hour  Intake 1070.53 ml  Output 400 ml  Net 670.53 ml       Labs: RENAL PANEL Recent Labs  Lab 09/08/24 1929 09/08/24 1939 09/09/24 0109 09/10/24 0440  NA 131* 131* 131* 130*  K 4.9 4.8 5.0 4.3  CL 99 103 98 98  CO2 15*  --  15* 18*  GLUCOSE 96 95 85 90  BUN 96* 103* 97* 77*  CREATININE 6.87* 7.40* 6.89* 5.47*  CALCIUM  8.0*  --  8.0* 7.6*  MG  --   --  2.0 1.9  PHOS  --   --  5.9* 5.1*  ALBUMIN  --   --  2.2* 2.0*    Liver Function Tests: Recent Labs  Lab 09/09/24 0109 09/10/24 0440  ALBUMIN 2.2* 2.0*   No results for input(s): LIPASE, AMYLASE in the last 168 hours. No results for  input(s): AMMONIA in the last 168 hours. CBC: Recent Labs    09/08/24 1929 09/08/24 1939 09/09/24 0109 09/09/24 0309 09/10/24 0440  HGB 7.5* 8.2* 7.1*  --  6.9*  MCV 79.1*  --  78.1*  --  75.7*  VITAMINB12  --   --   --  1,346*  --   FOLATE  --   --   --  9.4  --   FERRITIN  --   --   --  169  --   TIBC  --   --   --  239*  --   IRON   --   --   --  13*  --   RETICCTPCT  --   --   --  2.1  --     Cardiac Enzymes: No results for input(s): CKTOTAL, CKMB, CKMBINDEX, TROPONINI in the last 168 hours. CBG: No results for input(s): GLUCAP in the last 168 hours.  Iron  Studies:  Recent Labs    09/09/24 0309  IRON  13*  TIBC 239*  FERRITIN 169   Studies/Results: IR TUNNELED CENTRAL VENOUS CATH [facility] W IMG Result Date: 09/10/2024 INDICATION: End-stage renal disease  requiring hemodialysis. History of SVC syndrome status post venous stenting 2015. EXAM: TUNNELED hemodialysis catheter WITH ULTRASOUND AND FLUOROSCOPIC GUIDANCE ANESTHESIA/SEDATION: Moderate (conscious) sedation was employed during this procedure. A total of Versed  1.5 mg and Fentanyl  100 mcg was administered intravenously by the radiology nurse. Total intra-service moderate Sedation Time: 31 minutes. The patient's level of consciousness and vital signs were monitored continuously by radiology nursing throughout the procedure under my direct supervision. FLUOROSCOPY: Radiation Exposure Index (as provided by the fluoroscopic device): 61 mGy Kerma COMPLICATIONS: None immediate. PROCEDURE: Informed written consent was obtained from the patient after a discussion of the risks, benefits, and alternatives to treatment. Questions regarding the procedure were encouraged and answered. The left neck and chest were prepped with chlorhexidine  in a sterile fashion, and a sterile drape was applied covering the operative field. Maximum barrier sterile technique with sterile gowns and gloves were used for the procedure. A timeout was performed prior to the initiation of the procedure. After creating a small venotomy incision, a micropuncture kit was utilized to access the left internal jugular vein under direct, real-time ultrasound guidance after the overlying soft tissues were anesthetized with 1% lidocaine  with epinephrine . Ultrasound image documentation was performed. The microwire was kinked to measure appropriate catheter length. The micropuncture sheath was exchanged for a peel-away sheath over a guidewire. A 28 cm palindrome was tunneled in a retrograde fashion from the anterior chest wall to the venotomy incision. The catheter was then placed through the peel-away sheath with tip ultimately positioned at the superior caval-atrial junction. Final catheter positioning was confirmed and documented with a spot radiographic  image. The catheter aspirates and flushes normally. The catheter was flushed with appropriate volume heparin  dwells. The catheter exit site was secured with a 0-Prolene retention suture. The venotomy incision was closed with an interrupted 4-0 Vicryl, Dermabond and Steri-strips. Dressings were applied. The patient tolerated the procedure well without immediate post procedural complication. FINDINGS: After catheter placement, the tip lies within the superior cavoatrial junction. The catheter aspirates and flushes normally and is ready for immediate use. IMPRESSION: Successful placement of 28 cm palindrome tunneled hemodialysis catheter via the left internal jugular vein with tip terminating at the superior caval atrial junction. The catheter is ready for immediate use. Electronically Signed   By: Cordella Banner   On: 09/10/2024 09:41  IR PATIENT EVAL TECH 0-60 MINS Result Date: 09/09/2024 Herold Ozell HERO, RT     09/09/2024  5:08 PM IR tech was called to bedside by RN due to bloody dressing and bleeding from tunneled catheter site. Catheter not actively bleeding. New dressing applied with hemostasis achieved  US  OB LESS THAN 14 WEEKS WITH OB TRANSVAGINAL Result Date: 09/09/2024 EXAM: ULTRASOUND FIRST TRIMESTER TECHNIQUE: Transabdominal and Transvaginal first trimester obstetric pelvic duplex ultrasound was performed with real-time imaging, color flow Doppler imaging, and spectral analysis. COMPARISON: None available. CLINICAL HISTORY: Elevated serum hCG. FINDINGS: UTERUS: No focal myometrial mass. GESTATIONAL SAC(S): No intrauterine gestational sac. No subchorionic hemorrhage. YOLK SAC: Not visualized. EMBRYO(<11WK) /FETUS(>=11WK): Not visualized. CROWN RUMP LENGTH: Not measured. RATE OF CARDIAC ACTIVITY: Not measured. RIGHT OVARY: Unremarkable. Normal arterial and venous flow. LEFT OVARY: Not visualized due to overlying bowel gas. FREE FLUID: Trace free fluid in the pelvis. RENAL TRANSPLANT: Right lower  quadrant renal transplant noted. While there is no visualized hydronephrosis, mild cortical thinning and increased echogenicity present. IMPRESSION: 1. No intrauterine pregnancy. In the setting of a positive beta HCG, differential considerations include an early pregnancy, complete miscarriage, or an unseen ectopic pregnancy. Obstetric consultation with serial beta hCG and sonographic follow-up should be obtained, as clinically warranted. 2. Cortical thinning and increased echogenicity of the right lower quadrant renal transplant, worrisome for chronic medical renal disease or rejection. No hydronephrosis. Electronically signed by: Rogelia Myers MD 09/09/2024 09:12 AM EST RP Workstation: [id number]   CT Chest Wo Contrast Result Date: 09/08/2024 EXAM: CT CHEST WITHOUT CONTRAST 09/08/2024 10:57:58 PM TECHNIQUE: CT of the chest was performed without the administration of intravenous contrast. Multiplanar reformatted images are provided for review. Automated exposure control, iterative reconstruction, and/or weight based adjustment of the mA/kV was utilized to reduce the radiation dose to as low as reasonably achievable. COMPARISON: Comparison with chest radiograph 09/08/2024 and CT chest 11/28/2008. CLINICAL HISTORY: sob Shortness of breath. FINDINGS: MEDIASTINUM: Cardiac enlargement. No pericardial effusions. Normal caliber thoracic aorta. Mild calcification of the aorta and coronary arteries. Stent graft in the superior vena cava. The central airways are clear. LYMPH NODES: Mediastinal lymphadenopathy with pretracheal nodes measuring up to 1.7 cm short axis dimension. This is nonspecific, possibly reactive or lymphoproliferative change. Follow-up after resolution of acute process is recommended. LUNGS AND PLEURA: Small left and moderate right pleural effusion with bilateral basilar consolidation. This may represent compressive atelectasis and/or pneumonia. Focal infiltrates in the left lingula. Scattered  emphysematous changes in the lungs. No pneumothorax. SOFT TISSUES/BONES: Diffuse soft tissue edema throughout the subcutaneous fat. No acute bony abnormalities. UPPER ABDOMEN: No acute abnormalities demonstrated in the visualized upper abdomen. IMPRESSION: 1. Small left and moderate right pleural effusion with bilateral basilar consolidation, possibly representing compressive atelectasis and/or pneumonia. Focal infiltrates in the left lingula. 2. Cardiac enlargement with diffuse soft tissue edema, which can be seen with volume overload. 3. Mediastinal lymphadenopathy with pretracheal nodes measuring up to 1.7 cm short axis dimension, nonspecific, possibly reactive or lymphoproliferative change. Follow-up after resolution of the acute process is recommended. 4. Scattered emphysematous changes in the lungs. Electronically signed by: Elsie Gravely MD 09/08/2024 11:16 PM EST RP Workstation: [id number]   DG Chest 1 View Result Date: 09/08/2024 EXAM: 1 VIEW(S) XRAY OF THE CHEST 09/08/2024 07:26:00 PM COMPARISON: Comparison with 08/29/2022. CLINICAL HISTORY: Shortness of breath. FINDINGS: LINES, TUBES AND DEVICES: Vascular stent in Superior Vena Cava. LUNGS AND PLEURA: Mild pulmonary edema. Bibasilar airspace opacities. Small right pleural effusion. Possible pneumonia. No pneumothorax.  HEART AND MEDIASTINUM: Cardiomegaly. BONES AND SOFT TISSUES: No acute osseous abnormality. IMPRESSION: 1. Mild pulmonary edema with bibasilar airspace opacities and small right pleural effusion, which may represent pneumonia. 2. Cardiomegaly. Electronically signed by: Elsie Gravely MD 09/08/2024 07:32 PM EST RP Workstation: [id number]    Medications: Infusions:  heparin  1,350 Units/hr (09/10/24 0222)   iron  sucrose 200 mg (09/09/24 1600)   magnesium  sulfate bolus IVPB      Scheduled Medications:  sodium chloride    Intravenous Once   aspirin  EC  81 mg Oral Daily   atorvastatin   40 mg Oral Daily   Chlorhexidine  Gluconate  Cloth  6 each Topical Q0600   cloNIDine   0.2 mg Oral TID   darbepoetin (ARANESP ) injection - DIALYSIS  100 mcg Subcutaneous Q Thu-1800   famotidine   10 mg Oral QHS   ferrous sulfate   325 mg Oral QODAY   lidocaine   1 patch Transdermal Q24H   metoprolol  tartrate  25 mg Oral BID   mycophenolate   720 mg Oral BID   nystatin  cream   Topical BID   tacrolimus   3 mg Oral BID   zolpidem   5 mg Oral QHS    have reviewed scheduled and prn medications.  Physical Exam: General:NAD, comfortable Heart:RRR, s1s2 nl Lungs: Distant breath sound bilateral.  Abdomen:soft, Non-tender, non-distended Extremities: Bilateral lower extremities pitting edema present Dialysis Access: Left IJ TDC, no active bleed  Furious Chiarelli Prasad Warner Laduca 09/10/2024,10:32 AM  LOS: 2 days

## 2024-09-10 NOTE — Progress Notes (Signed)
 Physical Therapy Treatment Patient Details Name: [name] MRN: [id number] DOB: [birth year] Today's Date: 09/10/2024   History of Present Illness Pt is a 49 y.o. female who presented 09/08/24 with SOB and generalized peripheral edema. Pt admitted with volume overload and new A-fib with RVR. Pt also with kidney transplant failure and questionable false positive pregnancy test. PMH: end-stage kidney disease s/p kidney transplant in 2010, HTN, SVC syndrome, HLD, anemia    PT Comments  Focused session on lower extremity AROM exercises and progressing pt to get OOB and sit in the chair to improve her upright tolerance, endurance, and aerobic endurance. She continues to report and display DOE with all transfers and transitions, specifically with supine to sit transitions. She needed several minutes sitting EOB to recover after that transition. Her SpO2 appeared to drop to 82% on RA with standing mobility, but pleth was poor. She could likely need supplemental O2 when ambulating, but is currently satting in the high 90s% on RA when resting. She declined to ambulate further than to the chair today, but verbalized understanding of PT education to get up to chair and ambulate with staff daily. She agreed to sit in the recliner for at least 1 hour today. Will continue to follow acutely.     If plan is discharge home, recommend the following: A little help with walking and/or transfers;A little help with bathing/dressing/bathroom;Assistance with cooking/housework;Assist for transportation;Help with stairs or ramp for entrance   Can travel by private vehicle        Equipment Recommendations  BSC/3in1;Rollator (4 wheels)    Recommendations for Other Services       Precautions / Restrictions Precautions Precautions: Fall;Other (comment) Precaution/Restrictions Comments: watch SpO2 Restrictions Weight Bearing Restrictions Per Provider Order: No     Mobility  Bed Mobility Overal bed mobility:  Needs Assistance Bed Mobility: Supine to Sit     Supine to sit: Contact guard, HOB elevated, Used rails     General bed mobility comments: CGA for safety exiting L EOB, cuing pt to reach R UE to L rail and pull to prop up onto her L elbow to push trunk up to sit. Extra time to complete with noted DOE immediately upon sitting EOB, VSS on RA.    Transfers Overall transfer level: Needs assistance Equipment used: Rolling walker (2 wheels) Transfers: Sit to/from Stand, Bed to chair/wheelchair/BSC Sit to Stand: Contact guard assist   Step pivot transfers: Contact guard assist       General transfer comment: Pt able to stand from low EOB with increased ease today, only needing CGA for safety. Pt step pivoted to her L from bed to chair with CGA for safety and RW for upright balance.    Ambulation/Gait Ambulation/Gait assistance: Contact guard assist, +2 safety/equipment Gait Distance (Feet): 4 Feet Assistive device: Rolling walker (2 wheels) Gait Pattern/deviations: Step-through pattern, Decreased step length - right, Decreased step length - left, Decreased stride length, Trunk flexed Gait velocity: reduced Gait velocity interpretation: <1.31 ft/sec, indicative of household ambulator   General Gait Details: Pt takes slow, small steps while maintaining a flexed posture. Pt declined to ambulate further than to the chair today due to fatigue.   Stairs             Wheelchair Mobility     Tilt Bed    Modified Rankin (Stroke Patients Only)       Balance Overall balance assessment: Needs assistance Sitting-balance support: No upper extremity supported, Feet supported Sitting balance-Leahy Scale: Fair  Sitting balance - Comments: supervision for safety   Standing balance support: Bilateral upper extremity supported, During functional activity, Reliant on assistive device for balance Standing balance-Leahy Scale: Poor Standing balance comment: reliant on RW                             Communication Communication Communication: No apparent difficulties  Cognition Arousal: Alert Behavior During Therapy: Flat affect   PT - Cognitive impairments: No apparent impairments                       PT - Cognition Comments: Pt with flat affect due to not feeling well. Needs some encouragement but agreeable to limited session. Slow to initiate also likely due to not feeling well. Following commands: Intact      Cueing Cueing Techniques: Verbal cues  Exercises General Exercises - Lower Extremity Long Arc Quad: AROM, Strengthening, Both, 10 reps (with isometric hold) Hip Flexion/Marching: AROM, Strengthening, Both, 5 reps, Standing (with RW, poor clearance bil)    General Comments General comments (skin integrity, edema, etc.): SpO2 down to 82% with poor pleth while ambulating, recovered to 90s% on RA when sitting with good pleth; Educated pt on importance of getting up to chair and walking with staff daily while here. She verbalized understanding.      Pertinent Vitals/Pain Pain Assessment Pain Assessment: Faces Faces Pain Scale: Hurts little more Pain Location: generalized with movement Pain Descriptors / Indicators: Discomfort, Grimacing Pain Intervention(s): Limited activity within patient's tolerance, Monitored during session, Repositioned    Home Living Family/patient expects to be discharged to:: Private residence Living Arrangements: Spouse/significant other                      Prior Function            PT Goals (current goals can now be found in the care plan section) Acute Rehab PT Goals Patient Stated Goal: to feel better PT Goal Formulation: With patient Time For Goal Achievement: 09/23/24 Potential to Achieve Goals: Good Progress towards PT goals: Progressing toward goals    Frequency    Min 2X/week      PT Plan      Co-evaluation              AM-PAC PT 6 Clicks Mobility   Outcome  Measure  Help needed turning from your back to your side while in a flat bed without using bedrails?: A Little Help needed moving from lying on your back to sitting on the side of a flat bed without using bedrails?: A Little Help needed moving to and from a bed to a chair (including a wheelchair)?: A Little Help needed standing up from a chair using your arms (e.g., wheelchair or bedside chair)?: A Little Help needed to walk in hospital room?: A Little Help needed climbing 3-5 steps with a railing? : A Lot 6 Click Score: 17    End of Session   Activity Tolerance: Patient limited by fatigue Patient left: with call bell/phone within reach;in chair;with chair alarm set Nurse Communication: Mobility status (NT) PT Visit Diagnosis: Unsteadiness on feet (R26.81);Other abnormalities of gait and mobility (R26.89);Muscle weakness (generalized) (M62.81);Difficulty in walking, not elsewhere classified (R26.2)     Time: 8947-8885 PT Time Calculation (min) (ACUTE ONLY): 22 min  Charges:    $Therapeutic Exercise: 8-22 mins PT General Charges $$ ACUTE PT VISIT: 1 Visit  Theo Ferretti, PT, DPT Acute Rehabilitation Services  Office: [phone]    Theo CHRISTELLA Ferretti 09/10/2024, 1:49 PM

## 2024-09-10 NOTE — Progress Notes (Signed)
 "  Progress Note  Patient Name: [name] Date of Encounter: 09/10/2024 [facility] Cardiologist: None   Interval Summary   Patient feeling a little better today. Still having dyspnea and significant lower extremity swelling but she states that it is improved since her hemodialysis yesterday. Denies any chest pain or palpitations at this time.  Vital Signs Vitals:   09/10/24 0212 09/10/24 0300 09/10/24 0500 09/10/24 0600  BP: (!) 111/58 119/62  131/85  Pulse: 79 71  65  Resp:  (!) 8  12  Temp:  97.6 F (36.4 C)    TempSrc:  Axillary    SpO2:  98%  99%  Weight:   97.5 kg   Height:        Intake/Output Summary (Last 24 hours) at 09/10/2024 0814 Last data filed at 09/10/2024 0600 Gross per 24 hour  Intake 1120.53 ml  Output 400 ml  Net 720.53 ml      09/10/2024    5:00 AM 09/09/2024    4:46 AM 09/08/2024   11:24 PM  Last 3 Weights  Weight (lbs) 214 lb 15.2 oz 214 lb 4.6 oz 199 lb 8.3 oz  Weight (kg) 97.5 kg 97.2 kg 90.5 kg      Telemetry/ECG  Sinus rhythm, rates in the 60s-70s - Personally Reviewed  Physical Exam  GEN: Chronically ill-appearing female, in no acute distress.   Neck: Low neck JVD at 30 degrees, TDC in place Cardiac: RRR, no murmurs, rubs, or gallops.  Respiratory: Mild crackles to auscultation bilaterally. GI: Soft, nontender MS: 2+ pitting edema of bilateral lower extremities, weeping blisters present  Assessment & Plan  New onset atrial fibrillation with RVR Felt to be precipitated by acute onset volume overload Normal TSH, Mag, K Most recent echo 09/2022 showed LVEF 55-60%, no RWMA, G2DD, normal RV, RVSP 32.3 mmHg, mild LAE, mild MR, mild-moderate AI Repeat echo this admission pending Patient has spontaneously converted s/p HD yesterday with - , rates maintaining in the 60-70s Will give another morning dose of Lopressor  25 mg today, then decrease frequency to BID Continue IV heparin  for now, per IR will need while TDC is in  place due to SVC syndrome/stents   Acute volume overload ?Acute HFpEF  Presented with dyspnea and ongoing lower extremity swelling x several weeks PTA, suspicious for new onset heart failure however unclear given complex hx of kidney transplant/disease ProBNP >35,000 CT chest showed cardiac enlargement with diffuse soft tissue edema, small left and moderate right pleural effusion with bilateral basilar consolidation PTA on torsemide  40 mg daily at home S/p IV Lasix  40 mg x2 in the ED Further volume management via HD per nephrology   History of ESRD s/p renal transplant in 2010 Renal transplant failure TDC placed by IR with HD per nephrology Hgb 6.9 this AM, primary team aware awaiting nephrology input Ongoing management per primary/nephrology   Hypertension PTA on clonidine  0.2 mg TID at home BP stable, continue antihypertensives as ordered   Hyperlipidemia Last lipid panel in 2021, will need updated panel as outpatient Continue home rosuvastatin  20 mg daily   Elevated troponins Elevated 231 >> 232 >> 236 in the setting of renal transplant failure and likely demand ischemia secondary to prolonged elevated HR and acute volume overload Remains chest pain-free Echo pending   Per primary GERD  For questions or updates, please contact [facility] Please consult [email or web address] for contact info under         Signed, Owen MARLA Daniels, PA-C   "

## 2024-09-10 NOTE — Progress Notes (Signed)
" °   09/10/24 0131  Vitals  Temp 97.7 F (36.5 C)  Temp Source Axillary  BP (!) 127/90  MAP (mmHg) 98  Pulse Rate 71  ECG Heart Rate 74  Resp 14  Oxygen Therapy  SpO2 100 %  O2 Device Nasal Cannula  O2 Flow Rate (L/min) 2 L/min  During Treatment Monitoring  Blood Flow Rate (mL/min) 0 mL/min  Arterial Pressure (mmHg) -2.83 mmHg  Venous Pressure (mmHg) -1.21 mmHg  TMP (mmHg) 18.99 mmHg  Ultrafiltration Rate (mL/min) 374 mL/min  Dialysate Flow Rate (mL/min) 299 ml/min  Dialysate Potassium Concentration 2  Dialysate Calcium  Concentration 2.5  Duration of HD Treatment -hour(s) 2.08 hour(s)  Cumulative Fluid Removed (mL) per Treatment  400.08  HD Safety Checks Performed Yes  Intra-Hemodialysis Comments Tx completed  Post Treatment  Dialyzer Clearance Lightly streaked  Liters Processed 25.5  Fluid Removed (mL) 400 mL  Tolerated HD Treatment Yes (# episodes of Bradycardia without symptoms)  Hemodialysis Catheter Left Internal jugular Double lumen Permanent (Tunneled)  Placement Date/Time: 09/09/24 1438   Serial / Lot #: [id number]  Expiration Date: 01/09/29  Time Out: Correct patient;Correct site;Correct procedure  Maximum sterile barrier precautions: Hand hygiene;Cap;Mask;Sterile gown;Sterile gloves;Large sterile s...  Site Condition No complications  Blue Lumen Status Flushed  Red Lumen Status Flushed  Catheter fill solution Other (Comment)  Catheter fill volume (Arterial) 1.6 cc  Catheter fill volume (Venous) 1.6  Dressing Type Transparent  Dressing Status Clean, Dry, Intact  Interventions  (new catheter place today some blood at site, no intervention necessary as yet.)  Dressing Change Due  (possible next dialysis)  Post treatment catheter status Capped and Clamped    "

## 2024-09-10 NOTE — Assessment & Plan Note (Addendum)
-   Nephrology consulted, appreciate recommendations - HD planned for 01/30 PM - Getting IV iron  and started Aranesp  01/30 - Follow up on PTH level however elevated phos will most likely normalize with HD - S/p IV Lasix  80mg  on admission, continue diuresis with HD per nephro - Daily RFP - Continue to replete electrolyte derangements as they occur (Mg repleted today, expect Phos to decrease with PM HD)

## 2024-09-10 NOTE — Plan of Care (Signed)
 Primary team notified of multiple failed lab sticks by phlebotomy team and this RN due to generalized edema limiting visualization of vasculature. They requested an IV team order for labs. Per IV team, their scope does not include phlebotomy and they can not place multiple PIVs just for blood return. This RN was finally able to stick the patient for blood.   Primary team notified that with multiple failed attempts perhaps a PICC can be considered in the near future. They stated they will relay this to the day team.   Problem: Education: Goal: Knowledge of General Education information will improve Description: Including pain rating scale, medication(s)/side effects and non-pharmacologic comfort measures Outcome: Progressing   Problem: Health Behavior/Discharge Planning: Goal: Ability to manage health-related needs will improve Outcome: Progressing   Problem: Clinical Measurements: Goal: Ability to maintain clinical measurements within normal limits will improve Outcome: Progressing Goal: Will remain free from infection Outcome: Progressing Goal: Diagnostic test results will improve Outcome: Progressing Goal: Respiratory complications will improve Outcome: Progressing Goal: Cardiovascular complication will be avoided Outcome: Progressing   Problem: Activity: Goal: Risk for activity intolerance will decrease Outcome: Progressing   Problem: Nutrition: Goal: Adequate nutrition will be maintained Outcome: Progressing   Problem: Coping: Goal: Level of anxiety will decrease Outcome: Progressing   Problem: Elimination: Goal: Will not experience complications related to bowel motility Outcome: Progressing Goal: Will not experience complications related to urinary retention Outcome: Progressing   Problem: Pain Managment: Goal: General experience of comfort will improve and/or be controlled Outcome: Progressing   Problem: Safety: Goal: Ability to remain free from injury will  improve Outcome: Progressing   Problem: Skin Integrity: Goal: Risk for impaired skin integrity will decrease Outcome: Progressing   Problem: Education: Goal: Knowledge of disease or condition will improve Outcome: Progressing Goal: Understanding of medication regimen will improve Outcome: Progressing   Problem: Cardiac: Goal: Ability to achieve and maintain adequate cardiopulmonary perfusion will improve Outcome: Progressing   Problem: Health Behavior/Discharge Planning: Goal: Ability to safely manage health-related needs after discharge will improve Outcome: Progressing   Problem: Education: Goal: Knowledge of disease and its progression will improve Outcome: Progressing Goal: Individualized Educational Video(s) Outcome: Progressing   Problem: Fluid Volume: Goal: Compliance with measures to maintain balanced fluid volume will improve Outcome: Progressing   Problem: Health Behavior/Discharge Planning: Goal: Ability to manage health-related needs will improve Outcome: Progressing   Problem: Nutritional: Goal: Ability to make healthy dietary choices will improve Outcome: Progressing   Problem: Clinical Measurements: Goal: Complications related to the disease process, condition or treatment will be avoided or minimized Outcome: Progressing

## 2024-09-10 NOTE — Progress Notes (Signed)
" °  Echocardiogram 2D Echocardiogram has been performed.  Koleen KANDICE Popper, RDCS 09/10/2024, 12:20 PM "

## 2024-09-10 NOTE — Progress Notes (Addendum)
 Attempted to meet with pt at bedside but pt working with therapy. Will attempt to see pt later today.   Randine Mungo Dialysis Navigator [phone]  Addendum at 11:45 am: Attempted to meet with pt again but pt receiving pt care by RN.   Addendum at 1:30 pm: Met with pt at bedside while pt receiving HD. Introduced self and explained role. Pt confirms that she resides in [place], [place] but plans to come to a clinic in [place] in order to stay with a [facility] provider. Pt prefers [facility] on [address]. Discussed out-pt HD options- TCU due to pt's interest in home hemo vs regular 3 day a week in-center. Pt prefers and agreeable to TCU 4x's a week in order to get into home therapy as soon as possible for home hemo training. Pt states that significant other can drive pt to TCU 4x's a week at d/c. Referral submitted to [facility] admissions today for review for TCU chair. Will assist as needed.

## 2024-09-10 NOTE — Progress Notes (Signed)
 PHARMACY - ANTICOAGULATION CONSULT NOTE  Pharmacy Consult for heparin  Indication: atrial fibrillation  Allergies[1]  Patient Measurements: Height: 5' 1.5 (156.2 cm) Weight: 97.5 kg (214 lb 15.2 oz) IBW/kg (Calculated) : 48.95 HEPARIN  DW (KG): 70  Vital Signs: Temp: 97.7 F (36.5 C) (01/30 2032) Temp Source: Oral (01/30 2032) BP: 139/84 (01/30 2032) Pulse Rate: 75 (01/30 2032)  Labs: Recent Labs    09/08/24 1939 09/09/24 0109 09/09/24 1048 09/10/24 0440 09/10/24 1053 09/10/24 2131  HGB 8.2* 7.1*  --  6.9* 6.7* 8.3*  HCT 24.0* 24.6*  --  22.8* 22.7* 27.6*  PLT  --  343  --  320 291  --   HEPARINUNFRC  --   --    < > 0.31 0.16* 0.20*  CREATININE 7.40* 6.89*  --  5.47*  --   --    < > = values in this interval not displayed.    Estimated Creatinine Clearance: 13.9 mL/min (A) (by C-G formula based on SCr of 5.47 mg/dL (H)).  Assessment: 77 yoF presented with shortness of breath. Pharmacy consulted to dose heparin  for afib.  -Hx of SVC syndrome w/ extensive central venous stenosis s/p venous stent placed in 2015 on DAPT -Hgb 7s (bl~7-8)-anemia of chronic kidney disease, plts WNL -No prior oral anticoagulation   Heparin  level subtherapeutic (0.2) on infusion at 1500 units/hr. No issues with line or bleeding reported per RN. Hgb up to 8.3 (s/p PRBC x 1 today)  Goal of Therapy:  Heparin  level 0.3-0.7 units/ml Monitor platelets by anticoagulation protocol: Yes   Plan:  Increase heparin  to 1700 units/hr F/u 8 heparin  level   Vito Ralph, PharmD, BCPS Please see amion for complete clinical pharmacist phone list 09/10/2024 10:15 PM         [1]  Allergies Allergen Reactions   Metoclopramide Anxiety and Other (See Comments)   Ciprofloxacin Nausea Only   Morphine Other (See Comments)    Headache /migraine    Vancomycin     gives patient red man syndrome

## 2024-09-10 NOTE — Progress Notes (Signed)
" °   09/10/24 1551  Vitals  BP 132/88  MAP (mmHg) 101  Pulse Rate 78  ECG Heart Rate 79  Resp (!) 21  Oxygen Therapy  SpO2 97 %  During Treatment Monitoring  HD Safety Checks Performed Yes  Intra-Hemodialysis Comments Tx completed;Tolerated well  Post Treatment  Dialyzer Clearance Clear  Liters Processed 60  Fluid Removed (mL) 1300 mL  Tolerated HD Treatment Yes   PRBC completed at 1425 "

## 2024-09-10 NOTE — Progress Notes (Signed)
 [id number]: Critical hgb of 6.9. Family medicine team notified. Type and screen ordered. Per family medicine, order for blood will be deferred until they can consult with nephrology.   Deanie Pinal RN

## 2024-09-10 NOTE — Assessment & Plan Note (Addendum)
 Hgb 6.9 - Receiving 1u PRBC before HD today  - IV iron  and Aransep as above per nephro

## 2024-09-10 NOTE — Progress Notes (Signed)
 Order received for PICC line placement , Per secure chat with nephrology Dr. Davied recommendations is to place a PIV or a central line if accesses is needed. Suknaim DO also agrees no PICC line and place central line if needed.PICC line order was discontinue.

## 2024-09-10 NOTE — Progress Notes (Signed)
 PHARMACY - ANTICOAGULATION CONSULT NOTE  Pharmacy Consult for heparin  Indication: atrial fibrillation  Allergies[1]  Patient Measurements: Height: 5' 1.5 (156.2 cm) Weight: 97.2 kg (214 lb 4.6 oz) IBW/kg (Calculated) : 48.95 HEPARIN  DW (KG): 70  Vital Signs: Temp: 97.6 F (36.4 C) (01/30 0300) Temp Source: Axillary (01/30 0300) BP: 119/62 (01/30 0300) Pulse Rate: 71 (01/30 0300)  Labs: Recent Labs    09/08/24 1929 09/08/24 1939 09/09/24 0109 09/09/24 1048 09/10/24 0440  HGB 7.5* 8.2* 7.1*  --  6.9*  HCT 25.7* 24.0* 24.6*  --  22.8*  PLT 343  --  343  --  320  HEPARINUNFRC  --   --   --  <0.10* 0.31  CREATININE 6.87* 7.40* 6.89*  --   --     Estimated Creatinine Clearance: 11 mL/min (A) (by C-G formula based on SCr of 6.89 mg/dL (H)).   Medical History: Past Medical History:  Diagnosis Date   Anemia    Anxiety    GERD (gastroesophageal reflux disease)    Hyperlipidemia    Hypertension    Assessment: 38 yoF presented with shortness of breath. Pharmacy consulted to dose heparin  for afib.  -Hx of SVC syndrome w/ extensive central venous stenosis s/p venous stent placed in 2015 on DAPT -Hgb 7s (bl~7-8)-anemia of chronic kidney disease, plts WNL -No prior oral anticoagulation   AM: heparin  level within goal on 1350 units/hr. Per RN,no signs/symptoms of bleeding. CBC shows Hgb dipped below 7 (primary team aware) and plts WNL  Goal of Therapy:  Heparin  level 0.3-0.7 units/ml Monitor platelets by anticoagulation protocol: Yes   Plan:  Continue heparin  1350 units/hr F/u 8 confirmatory heparin  level   Rutha Poplar, PharmD, BCPS Clinical Pharmacist 09/10/2024 5:27 AM       [1]  Allergies Allergen Reactions   Metoclopramide Anxiety and Other (See Comments)   Ciprofloxacin Nausea Only   Morphine Other (See Comments)    Headache /migraine    Vancomycin     gives patient red man syndrome

## 2024-09-10 NOTE — Telephone Encounter (Signed)
 This encounter was created in error - please disregard.

## 2024-09-10 NOTE — Assessment & Plan Note (Addendum)
 Positive serum pregnancy test though patient is monogamous with a female partner and denies any recent sexual activity with female partners. She does still have her uterus. - Serum bhcg 27 - Negative TV US  - Most likely sequale of renal disease, repeat serum bhcg in 48hrs (01/31)

## 2024-09-10 NOTE — Assessment & Plan Note (Signed)
 No BM in 3 days.  -Miralax   -Senna

## 2024-09-10 NOTE — Assessment & Plan Note (Addendum)
 CAD - continue home ASA 81mg  daily; holding home Plavix  75 mg daily HTN - holding home Clonidine  0.2 mg TID, losartan 50 mg daily, torsemide 20 mg daily, furosemide  80 mg daily PRN pending cardiology recommendations HLD - continue home rosuvastatin  20 mg daily - change to Lipitor 40mg  for renal clearance GERD - continue home Famotidine  40 mg daily - change to 10 mg for renal clearance H/o R kidney transplant - continue formulary equivalent of home mycophenolate  720 mg BID and home tacrolimus  3 mg BID

## 2024-09-10 NOTE — Progress Notes (Addendum)
 Dear Doctor: This patient has been identified as a candidate for CVC line (d/t renal status, pt not appropriate for midline/PICC) for the following reason (s): IV therapy over 48 hours, poor veins/poor circulatory system (CHF, COPD, emphysema, diabetes, steroid use, IV drug abuse, etc.), restarts due to phlebitis and infiltration in 24 hours, and incompatible drugs (aminophyllin, TPN, heparin , given with an antibiotic) If you agree, please write an order for the indicated device.   Thank you for supporting the early vascular access assessment program.

## 2024-09-10 NOTE — Progress Notes (Addendum)
 "    Daily Progress Note Intern Pager: [phone]  Patient name: [name] Medical record number: [id number] Date of birth: [birth year] Age: 49 y.o. Gender: female  Primary Care Provider: Cristopher Suzen HERO, NP Consultants: Cardiology, Nephrology Code Status: DNR/DNI   Pt Overview and Major Events to Date:  01/28: Admitted for new onset A-fib in the setting of volume overload and ESRD with renal transplant 01/29: TDC placed successfully by IR, HD started   Assessment and Plan:   [name] is a 49yo F w/PMH of ESRD w/ kidney transplant 2010, chronic glomerulonephritis, HFpEF, SVC syndrome s/p successful recanalization complicated by SVC tear and cardiac tamponade 2018, admitted for CHF exacerbation and new onset A-fib in the setting of volume overload. Assessment & Plan Atrial fibrillation, new onset (HCC) Volume overload Spontaneously converted to NSR, rate control in 70s.  - Cardiology consulted, appreciate recommendations -  Continue IV heparin  while TDC in place -  Another morning dose of 25mg  Lopressor  this AM, then decrease frequency to BID - Awaiting echo results - Tylenol  650 mg q6hrs PRN - Continue PT/OT - Continuous cardiac monitoring - Strict I&O's, daily weights - Daily Mg, CBC Kidney transplant failure - Nephrology consulted, appreciate recommendations - HD planned for 01/30 PM - Getting IV iron  and started Aranesp  01/30 - Follow up on PTH level however elevated phos will most likely normalize with HD - S/p IV Lasix  80mg  on admission, continue diuresis with HD per nephro - Daily RFP - Continue to replete electrolyte derangements as they occur (Mg repleted today, expect Phos to decrease with PM HD) IDA (iron  deficiency anemia) Hgb 6.9 - Receiving 1u PRBC before HD today  - IV iron  and Aransep as above per nephro Contact dermatitis Pruritic posterior lower legs since admission, today with vesicular rash R >L LE. - Benadryl  cream at bedside, not  helping much  - Trial triamcinolone  cream 0.1% to affected areas  Constipation No BM in 3 days.  -Miralax   -Senna Positive pregnancy test Positive serum pregnancy test though patient is monogamous with a female partner and denies any recent sexual activity with female partners. She does still have her uterus. - Serum bhcg 27 - Negative TV US  - Most likely sequale of renal disease, repeat serum bhcg in 48hrs (01/31) Chronic health problem CAD - continue home ASA 81mg  daily; holding home Plavix  75 mg daily HTN - holding home Clonidine  0.2 mg TID, losartan 50 mg daily, torsemide  20 mg daily, furosemide  80 mg daily PRN pending cardiology recommendations HLD - continue home rosuvastatin  20 mg daily - change to Lipitor 40mg  for renal clearance GERD - continue home Famotidine  40 mg daily - change to 10 mg for renal clearance H/o R kidney transplant - continue formulary equivalent of home mycophenolate  720 mg BID and home tacrolimus  3 mg BID  FEN/GI: Renal diet with fluid restriction PPx: Heparin  Dispo:Home pending clinical improvement . Barriers include need for HD, diuresis.   Subjective:  Patient was seen and examined at bedside. She states she feels no different from admission. Denies CP, SOB. Itchy legs now have vesicular rash.   Objective: Temp:  [97.4 F (36.3 C)-98 F (36.7 C)] 97.6 F (36.4 C) (01/30 0300) Pulse Rate:  [49-129] 65 (01/30 0600) Resp:  [8-20] 12 (01/30 0600) BP: (95-165)/(47-117) 131/85 (01/30 0600) SpO2:  [95 %-100 %] 99 % (01/30 0600) Weight:  [97.5 kg] 97.5 kg (01/30 0500) Physical Exam: General: uncomfortable appearing morbidly obese female lying in hospital bed in  no acute distress Cardiovascular: RRR, no m/r/g Respiratory: good respiratory effort, mild rhonchi to R base Abdomen: soft, non-tender, non-distended, normal BS Extremities: venous insufficiency skin changes to RLE with bandage to LLE, 1+ pitting edema bilaterally, 2+ pitting edema to hands  and forearms, linear vesicular skin colored rash to medial aspect of RLE  Laboratory: Most recent CBC Lab Results  Component Value Date   WBC 6.9 09/10/2024   HGB 6.9 (LL) 09/10/2024   HCT 22.8 (L) 09/10/2024   MCV 75.7 (L) 09/10/2024   PLT 320 09/10/2024   Most recent BMP    Latest Ref Rng & Units 09/10/2024    4:40 AM  BMP  Glucose 70 - 99 mg/dL 90   BUN 6 - 20 mg/dL 77   Creatinine 9.55 - 1.00 mg/dL 4.52   Sodium 864 - 854 mmol/L 130   Potassium 3.5 - 5.1 mmol/L 4.3   Chloride 98 - 111 mmol/L 98   CO2 22 - 32 mmol/L 18   Calcium  8.9 - 10.3 mg/dL 7.6    Lupie Credit, DO 09/10/2024, 7:16 AM  PGY-1, [facility] FPTS Intern pager: [phone], text pages welcome Secure chat group [facility] Teaching Service   "

## 2024-09-10 NOTE — Telephone Encounter (Signed)
 Pharmacy Patient Advocate Encounter  Insurance verification completed.    The patient is insured through [employer]. Patient has Medicare and is not eligible for a copay card, but may be able to apply for patient assistance or Medicare RX Payment Plan (Patient Must reach out to their plan, if eligible for payment plan), if available.    Ran test claim for Eliquis  5mg  tablet and the current 30 day co-pay is $4.90.   This test claim was processed through [facility]- copay amounts may vary at other pharmacies due to pharmacy/plan contracts, or as the patient moves through the different stages of their insurance plan.

## 2024-09-11 ENCOUNTER — Other Ambulatory Visit: Payer: Self-pay

## 2024-09-11 DIAGNOSIS — N185 Chronic kidney disease, stage 5: Secondary | ICD-10-CM | POA: Diagnosis not present

## 2024-09-11 DIAGNOSIS — I5041 Acute combined systolic (congestive) and diastolic (congestive) heart failure: Secondary | ICD-10-CM | POA: Diagnosis not present

## 2024-09-11 DIAGNOSIS — I4891 Unspecified atrial fibrillation: Secondary | ICD-10-CM | POA: Diagnosis not present

## 2024-09-11 LAB — RENAL FUNCTION PANEL
Albumin: 2 g/dL — ABNORMAL LOW (ref 3.5–5.0)
Anion gap: 14 (ref 5–15)
BUN: 53 mg/dL — ABNORMAL HIGH (ref 6–20)
CO2: 21 mmol/L — ABNORMAL LOW (ref 22–32)
Calcium: 7.7 mg/dL — ABNORMAL LOW (ref 8.9–10.3)
Chloride: 99 mmol/L (ref 98–111)
Creatinine, Ser: 4.54 mg/dL — ABNORMAL HIGH (ref 0.44–1.00)
GFR, Estimated: 11 mL/min — ABNORMAL LOW
Glucose, Bld: 76 mg/dL (ref 70–99)
Phosphorus: 3.9 mg/dL (ref 2.5–4.6)
Potassium: 4.4 mmol/L (ref 3.5–5.1)
Sodium: 134 mmol/L — ABNORMAL LOW (ref 135–145)

## 2024-09-11 LAB — TYPE AND SCREEN
ABO/RH(D): B POS
Antibody Screen: NEGATIVE
Unit division: 0

## 2024-09-11 LAB — LIPID PANEL
Cholesterol: 65 mg/dL (ref 0–200)
HDL: 29 mg/dL — ABNORMAL LOW
LDL Cholesterol: 20 mg/dL (ref 0–99)
Total CHOL/HDL Ratio: 2.3 ratio
Triglycerides: 80 mg/dL
VLDL: 16 mg/dL (ref 0–40)

## 2024-09-11 LAB — CBC
HCT: 26 % — ABNORMAL LOW (ref 36.0–46.0)
Hemoglobin: 8 g/dL — ABNORMAL LOW (ref 12.0–15.0)
MCH: 24 pg — ABNORMAL LOW (ref 26.0–34.0)
MCHC: 30.8 g/dL (ref 30.0–36.0)
MCV: 77.8 fL — ABNORMAL LOW (ref 80.0–100.0)
Platelets: 316 10*3/uL (ref 150–400)
RBC: 3.34 MIL/uL — ABNORMAL LOW (ref 3.87–5.11)
RDW: 16.8 % — ABNORMAL HIGH (ref 11.5–15.5)
WBC: 7.9 10*3/uL (ref 4.0–10.5)
nRBC: 0.5 % — ABNORMAL HIGH (ref 0.0–0.2)

## 2024-09-11 LAB — HEPARIN LEVEL (UNFRACTIONATED)
Heparin Unfractionated: 0.48 [IU]/mL (ref 0.30–0.70)
Heparin Unfractionated: 0.48 [IU]/mL (ref 0.30–0.70)

## 2024-09-11 LAB — BPAM RBC
Blood Product Expiration Date: 202602232359
ISSUE DATE / TIME: 202601301127
Unit Type and Rh: 7300

## 2024-09-11 LAB — MAGNESIUM: Magnesium: 2.1 mg/dL (ref 1.7–2.4)

## 2024-09-11 LAB — HCG, QUANTITATIVE, PREGNANCY: hCG, Beta Chain, Quant, S: 20 m[IU]/mL — ABNORMAL HIGH

## 2024-09-11 MED ORDER — CHLORHEXIDINE GLUCONATE CLOTH 2 % EX PADS
6.0000 | MEDICATED_PAD | Freq: Every day | CUTANEOUS | Status: AC
  Administered 2024-09-11 – 2024-09-17 (×7): 6 via TOPICAL

## 2024-09-11 NOTE — Assessment & Plan Note (Addendum)
 Positive serum pregnancy test though patient is monogamous with a female partner and denies any recent sexual activity with female partners. She does still have her uterus. - Negative TV US  and serum bhcg 27 >> 20 (48 hr repeat), much less concern for miscarriage or ectopic with declining bHCG, most likely sequelae of renal disease. Will not trend bHCG further.

## 2024-09-11 NOTE — Progress Notes (Signed)
 "    Daily Progress Note Intern Pager: [phone]  Patient name: [name] Medical record number: [id number] Date of birth: [birth year] Age: 49 y.o. Gender: female  Primary Care Provider: Cristopher Suzen HERO, NP Consultants: Cardiology, Nephrology Code Status: DNR/DNI   Pt Overview and Major Events to Date:  01/28: Admitted for new onset A-fib in the setting of volume overload and ESRD with renal transplant 01/29: TDC placed successfully by IR, HD started 01/30: CCM consulted for central line placement    Assessment and Plan:   [name] is a 49yo F w/PMH of ESRD w/ kidney transplant 2010, chronic glomerulonephritis, HFpEF, SVC syndrome s/p successful recanalization complicated by SVC tear and cardiac tamponade 2018, admitted for CHF exacerbation and new onset A-fib in the setting of volume overload. Diuresis continues with daily HD per nephrology.  Assessment & Plan Atrial fibrillation, new onset (HCC) Volume overload Acute combined systolic and diastolic heart failure (HCC) Spontaneously converted to NSR 01/29 overnight after first HD, rate control in 70s.  - Cardiology consulted, appreciate recommendations -  Continue IV heparin  while TDC in place, change to DOAC Eliquis  once hgb stable and no other procedures planned -  25mg  Lopressor  BID, consolidate to succinate prior to d/c - Echo 01/30: newly reduced LVEF of 30-35%. See below for complete impression.  - Transfusion threshold <7 - Given anemia, avoid antiplatelet - IR consulted for [facility] placement  - Consider AC at d/c for high risk of thrombosis - Tylenol  650 mg q6hrs PRN - Continue PT/OT - Continuous cardiac monitoring - Strict I&O's, daily weights - Daily Mg, CBC Kidney transplant failure IDA (iron  deficiency anemia) Hgb 8.0 and stable after 1u PRBC 01/30 prior to dialysis. - Daily RFP - Nephrology consulted, appreciate recommendations - HD #3 today, 01/31 - Received IV iron  and started Aranesp  01/30 -  S/p IV Lasix  80mg  on admission, continue diuresis with HD per nephro - Continue to replete electrolyte derangements as they occur   - Mg stable  - Phos WNL Contact dermatitis Much improved with addition of triamcinolone . Most likely due to volume overload and excoriations.  - Continue triamcinolone  cream 0.1% to affected areas PRN Positive pregnancy test (Resolved: 09/11/2024) Positive serum pregnancy test though patient is monogamous with a female partner and denies any recent sexual activity with female partners. She does still have her uterus. - Negative TV US  and serum bhcg 27 >> 20 (48 hr repeat), much less concern for miscarriage or ectopic with declining bHCG, most likely sequelae of renal disease. Will not trend bHCG further. Chronic health problem CAD - continue home ASA 81mg  daily; holding home Plavix  75 mg daily HTN - holding home Clonidine  0.2 mg TID, losartan 50 mg daily, torsemide  20 mg daily, furosemide  80 mg daily PRN pending cardiology recommendations HLD - continue home rosuvastatin  20 mg daily - change to Lipitor 40mg  for renal clearance GERD - continue home Famotidine  40 mg daily - change to 10 mg for renal clearance H/o R kidney transplant - continue formulary equivalent of home mycophenolate  720 mg BID and home tacrolimus  3 mg BID Constipation - continue Miralax  and Senna daily, increase to BID PRN  FEN/GI: Renal diet with fluid restriction PPx: Heparin  Dispo:Home pending clinical improvement . Barriers include need for HD.   Subjective:  Patient was seen and examined at bedside. She looks like she has more color in her face and her RLE edema is noticeably improved. She states her breathing is fine at rest however she  still has DOE when working with PT. Her leg itching has gotten better since adding triamcinolone  yesterday. No other complaints.   Objective: Temp:  [97.5 F (36.4 C)-98 F (36.7 C)] 98 F (36.7 C) (01/31 0325) Pulse Rate:  [63-81] 63 (01/31  0325) Resp:  [7-22] 9 (01/31 0325) BP: (90-157)/(60-103) 119/80 (01/31 0445) SpO2:  [93 %-100 %] 94 % (01/31 0325) Weight:  [89.5 kg] 89.5 kg (01/31 0446) Physical Exam: General: morbidly obese female resting comfortably lying supine in bed, in no acute distress, partner at bedside Cardiovascular: RRR, no m/r/g Respiratory: CTAB, good respiratory effort, no w/r/r, good air movement throughout Abdomen: soft, non-tender, non-distended Extremities: improving edema especially to RLE, mild TTP to bilateral LE, much improved hand edema, 1+ pitting edema to b/l legs, 1+ pitting edema to hands Skin: improving vesicular rash under bandage almost completely resolved with some residual pinpoint vesicles in linear fashion to medial aspect of RLE  Laboratory: Most recent CBC Lab Results  Component Value Date   WBC 7.6 09/10/2024   HGB 8.3 (L) 09/10/2024   HCT 27.6 (L) 09/10/2024   MCV 77.7 (L) 09/10/2024   PLT 291 09/10/2024   Most recent BMP    Latest Ref Rng & Units 09/10/2024    4:40 AM  BMP  Glucose 70 - 99 mg/dL 90   BUN 6 - 20 mg/dL 77   Creatinine 9.55 - 1.00 mg/dL 4.52   Sodium 864 - 854 mmol/L 130   Potassium 3.5 - 5.1 mmol/L 4.3   Chloride 98 - 111 mmol/L 98   CO2 22 - 32 mmol/L 18   Calcium  8.9 - 10.3 mg/dL 7.6    Imaging/Diagnostic Tests: Echocardiogram 1/30 IMPRESSIONS   1. Left ventricular ejection fraction, by estimation, is 30 to 35%. The  left ventricle has moderately decreased function. The left ventricle  demonstrates regional wall motion abnormalities (see scoring  diagram/findings for description). There is mild  concentric left ventricular hypertrophy. Left ventricular diastolic  parameters are consistent with Grade II diastolic dysfunction  (pseudonormalization). There is the interventricular septum is flattened  in diastole ('D' shaped left ventricle), consistent   with right ventricular volume overload.   2. Right ventricular systolic function is mildly  reduced. The right  ventricular size is moderately enlarged. There is moderately elevated  pulmonary artery systolic pressure.   3. Left atrial size was mildly dilated.   4. Right atrial size was moderately dilated.   5. Moderate pleural effusion.   6. The mitral valve is normal in structure. Mild mitral valve  regurgitation. No evidence of mitral stenosis.   7. Tricuspid valve regurgitation is moderate to severe.   8. The aortic valve is tricuspid. Aortic valve regurgitation is moderate.  No aortic stenosis is present.   9. The inferior vena cava is dilated in size with <50% respiratory  variability, suggesting right atrial pressure of 15 mmHg.    Lupie Credit, DO 09/11/2024, 7:59 AM  PGY-1, [facility] FPTS Intern pager: [phone], text pages welcome Secure chat group [facility] Teaching Service   "

## 2024-09-11 NOTE — Assessment & Plan Note (Addendum)
 Hgb 8.0 and stable after 1u PRBC 01/30 prior to dialysis. - Daily RFP - Nephrology consulted, appreciate recommendations - HD #3 today, 01/31 - Received IV iron  and started Aranesp  01/30 - S/p IV Lasix  80mg  on admission, continue diuresis with HD per nephro - Continue to replete electrolyte derangements as they occur   - Mg stable  - Phos WNL

## 2024-09-11 NOTE — Progress Notes (Addendum)
 PHARMACY - ANTICOAGULATION CONSULT NOTE  Pharmacy Consult for heparin  Indication: atrial fibrillation  Allergies[1]  Patient Measurements: Height: 5' 1.5 (156.2 cm) Weight: 89.5 kg (197 lb 5 oz) IBW/kg (Calculated) : 48.95 HEPARIN  DW (KG): 70  Vital Signs: Temp: 97.3 F (36.3 C) (01/31 0700) Temp Source: Oral (01/31 0700) BP: 104/76 (01/31 0700) Pulse Rate: 61 (01/31 0700)  Labs: Recent Labs    09/08/24 1939 09/09/24 0109 09/09/24 1048 09/10/24 0440 09/10/24 1053 09/10/24 2131 09/11/24 0725  HGB 8.2* 7.1*  --  6.9* 6.7* 8.3* 8.0*  HCT 24.0* 24.6*  --  22.8* 22.7* 27.6* 26.0*  PLT  --  343  --  320 291  --  316  HEPARINUNFRC  --   --    < > 0.31 0.16* 0.20* 0.48  CREATININE 7.40* 6.89*  --  5.47*  --   --   --    < > = values in this interval not displayed.    Estimated Creatinine Clearance: 13.2 mL/min (A) (by C-G formula based on SCr of 5.47 mg/dL (H)).  Assessment: 4 yoF presented with shortness of breath. Pharmacy consulted to dose heparin  for afib.  -Hx of SVC syndrome w/ extensive central venous stenosis s/p venous stent placed in 2015 on DAPT -Hgb 7s (bl~7-8)-anemia of chronic kidney disease, plts WNL -No prior oral anticoagulation   Heparin  level therapeutic at 0.48 on infusion at 1700 units/hr. No issues with line or bleeding reported per RN. Hgb stable, platelets wnl.  Goal of Therapy:  Heparin  level 0.3-0.7 units/ml Monitor platelets by anticoagulation protocol: Yes   Plan:  Continue heparin  at 1700 units/hr Confirmatory heparin  level in 8 hours Daily heparin  level and CBC  Dionicia Canavan, PharmD, RPh PGY1 Acute Care Pharmacy Resident [facility]  09/11/2024 8:43 AM           [1]  Allergies Allergen Reactions   Metoclopramide Anxiety and Other (See Comments)   Ciprofloxacin Nausea Only   Morphine Other (See Comments)    Headache /migraine    Vancomycin     gives patient red man syndrome

## 2024-09-11 NOTE — Progress Notes (Signed)
 PHARMACY - ANTICOAGULATION CONSULT NOTE  Pharmacy Consult for heparin  Indication: atrial fibrillation  Allergies[1]  Patient Measurements: Height: 5' 1.5 (156.2 cm) Weight: 89.5 kg (197 lb 5 oz) IBW/kg (Calculated) : 48.95 HEPARIN  DW (KG): 70  Vital Signs: Temp: 97.6 F (36.4 C) (01/31 1451) Temp Source: Oral (01/31 1451) BP: 137/88 (01/31 1451) Pulse Rate: 60 (01/31 1451)  Labs: Recent Labs    09/09/24 0109 09/09/24 1048 09/10/24 0440 09/10/24 1053 09/10/24 2131 09/11/24 0725 09/11/24 1529  HGB 7.1*  --  6.9* 6.7* 8.3* 8.0*  --   HCT 24.6*  --  22.8* 22.7* 27.6* 26.0*  --   PLT 343  --  320 291  --  316  --   HEPARINUNFRC  --    < > 0.31 0.16* 0.20* 0.48 0.48  CREATININE 6.89*  --  5.47*  --   --  4.54*  --    < > = values in this interval not displayed.    Estimated Creatinine Clearance: 15.9 mL/min (A) (by C-G formula based on SCr of 4.54 mg/dL (H)).  Assessment: 36 yoF presented with shortness of breath. Pharmacy consulted to dose heparin  for afib.  -Hx of SVC syndrome w/ extensive central venous stenosis s/p venous stent placed in 2015 on DAPT -Hgb 7s (bl~7-8)-anemia of chronic kidney disease, plts WNL -No prior oral anticoagulation   Heparin  level therapeutic at 0.48 on infusion at 1700 units/hr. No issues with line or bleeding reported per RN. Hgb stable, platelets wnl.  PM: heparin  level 0.48 remains therapeutic on 1700 units/hr. No issues with the infusion or bleeding reported.   Goal of Therapy:  Heparin  level 0.3-0.7 units/ml Monitor platelets by anticoagulation protocol: Yes   Plan:  Continue heparin  at 1700 units/hr Daily heparin  level and CBC  Rocky Slade, PharmD, BCPS 09/11/2024 4:12 PM   Please check AMION for all [facility] Pharmacy phone numbers After 10:00 PM, call Main Pharmacy [phone]          [1]  Allergies Allergen Reactions   Metoclopramide Anxiety and Other (See Comments)   Ciprofloxacin Nausea Only   Morphine Other  (See Comments)    Headache /migraine    Vancomycin     gives patient red man syndrome

## 2024-09-11 NOTE — Assessment & Plan Note (Addendum)
 Spontaneously converted to NSR 01/29 overnight after first HD, rate control in 70s.  - Cardiology consulted, appreciate recommendations -  Continue IV heparin  while TDC in place, change to DOAC Eliquis  once hgb stable and no other procedures planned -  25mg  Lopressor  BID, consolidate to succinate prior to d/c - Echo 01/30: newly reduced LVEF of 30-35%. See below for complete impression.  - Transfusion threshold <7 - Given anemia, avoid antiplatelet - IR consulted for [facility] placement  - Consider AC at d/c for high risk of thrombosis - Tylenol  650 mg q6hrs PRN - Continue PT/OT - Continuous cardiac monitoring - Strict I&O's, daily weights - Daily Mg, CBC

## 2024-09-11 NOTE — Assessment & Plan Note (Deleted)
 No BM in 3 days.  -Miralax   -Senna

## 2024-09-11 NOTE — Progress Notes (Addendum)
 At bedside for PICC placement per order.  D/W pt and wife PICC risks and benefits, also concerns addressed re SVC syndrome with stent and SVC tear in 2018.  Wife voiced concerns re also placing a PICC in the same region.  Both then stated that pt has had multiple fistulas/grafts BUE that have failed also in bil groins.  Unable to proceed with PICC placement or midline placement.   Message sent to Dr Donah and group to notify and refer  to IR.  Also notified that pt appeared to be having hallucinations and grabbing at things in the air.

## 2024-09-11 NOTE — Progress Notes (Addendum)
 [facility] NEPHROLOGY PROGRESS NOTE  Assessment/ Plan: Pt is a 49 y.o. yo female  with past medical history significant for obesity, hypertension, anemia, dyslipidemia, SVC syndrome, ESRD status post LRKT in 2010 at [facility] presented with worsening shortness of breath, fluid overload, seen as a consultation for the management of new ESRD.   # New ESRD after failed kidney transplant with evidence of uremia and fluid overload. -s/p LIJ TDC by IR on 1/29 and received first dialysis.  Renal navigator is following to arrange outpatient HD for ESRD, going to TCU. 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  -She had dialysis twice and tolerated well.  Will plan for another dialysis today to help with UF.   # Kidney transplant LRKT (mother) in 2010 at [facility]: Continue Prograf  and Myfortic  at home dose.   # Acute CHF exacerbation: Elevated BNP, UF with dialysis.  Cardiology is following.   # New onset A-fib with RVR: Currently on heparin  and managing by cardiologist.   # Anemia of CKD: Noted iron  saturation of 6%.  Ordered IV iron  and started Aranesp .  Also required blood transfusion.  Monitor lab.   # CKD-MBD: Check PTH level, hyperphosphatemia improved with dialysis.   # Hyponatremia, hypervolemia: UF with HD, fluid restriction and monitor lab.  Addendum:  I just spoke with the primary team as well as with ICU team about difficult IV access.  Apparently the ICU team was contacted to place central line.  I informed the team that we can get regular labs during the dialysis days from the HD catheter, however if patient needs medication or frequent lab draws then she will need a separate line.  When I spoke with the patient today she informed me that she had a failed AV fistula in her both arms in the past.  Patient stated that she had AV graft in her groin which was failed as well.  At this point, there is probably not  much to salvage arms for future AV fistula.  In this situation, okay to proceed with a midline or PICC line.  Subjective: Seen and examined at bedside.  Tolerated dialysis well yesterday with 1.3 L.  Patient states she has not felt any better today.  In room air.  Denies nausea, chest pain.  Her partner at the bedside. Objective Vital signs in last 24 hours: Vitals:   09/11/24 0325 09/11/24 0445 09/11/24 0446 09/11/24 0700  BP: 136/88 119/80  104/76  Pulse: 63   61  Resp: (!) 9   20  Temp: 98 F (36.7 C)   (!) 97.3 F (36.3 C)  TempSrc: Oral   Oral  SpO2: 94%   98%  Weight:   89.5 kg   Height:       Weight change: -8 kg  Intake/Output Summary (Last 24 hours) at 09/11/2024 1103 Last data filed at 09/11/2024 0700 Gross per 24 hour  Intake 815.86 ml  Output 1300 ml  Net -484.14 ml       Labs: RENAL PANEL Recent Labs  Lab 09/08/24 1929 09/08/24 1939 09/09/24 0109 09/10/24 0440 09/11/24 0725  NA 131* 131* 131* 130* 134*  K 4.9 4.8 5.0 4.3 4.4  CL 99 103 98 98 99  CO2 15*  --  15* 18* 21*  GLUCOSE 96 95 85 90 76  BUN 96* 103* 97* 77* 53*  CREATININE 6.87* 7.40* 6.89* 5.47* 4.54*  CALCIUM  8.0*  --  8.0* 7.6* 7.7*  MG  --   --  2.0 1.9 2.1  PHOS  --   --  5.9* 5.1* 3.9  ALBUMIN  --   --  2.2* 2.0* 2.0*    Liver Function Tests: Recent Labs  Lab 09/09/24 0109 09/10/24 0440 09/11/24 0725  ALBUMIN 2.2* 2.0* 2.0*   No results for input(s): LIPASE, AMYLASE in the last 168 hours. No results for input(s): AMMONIA in the last 168 hours. CBC: Recent Labs    09/09/24 0109 09/09/24 0309 09/10/24 0440 09/10/24 1053 09/10/24 2131 09/11/24 0725  HGB 7.1*  --  6.9* 6.7* 8.3* 8.0*  MCV 78.1*  --  75.7* 77.7*  --  77.8*  VITAMINB12  --  1,346*  --   --   --   --   FOLATE  --  9.4  --   --   --   --   FERRITIN  --  169  --   --   --   --   TIBC  --  239*  --   --   --   --   IRON   --  13*  --   --   --   --   RETICCTPCT  --  2.1  --   --   --   --     Cardiac  Enzymes: No results for input(s): CKTOTAL, CKMB, CKMBINDEX, TROPONINI in the last 168 hours. CBG: No results for input(s): GLUCAP in the last 168 hours.  Iron  Studies:  Recent Labs    09/09/24 0309  IRON  13*  TIBC 239*  FERRITIN 169   Studies/Results: DG Chest Port 1 View Result Date: 09/10/2024 CLINICAL DATA:  Central line placement EXAM: PORTABLE CHEST 1 VIEW COMPARISON:  09/08/2024 FINDINGS: Single frontal view of the chest demonstrates left-sided internal jugular central venous catheter, tip overlying the atriocaval junction. Stable venous stents are seen within the SVC and left brachiocephalic vein. Stable enlargement of the cardiac silhouette. Continue veiling opacities at the lung bases consistent with consolidation and effusions, right greater than left. No pneumothorax. No acute bony abnormalities. IMPRESSION: 1. No complication after left internal jugular dialysis catheter placement. 2. Stable enlarged cardiac silhouette and bibasilar consolidation/effusions. Electronically Signed   By: Ozell Daring M.D.   On: 09/10/2024 17:16   US  EKG SITE RITE Result Date: 09/10/2024 If Site Rite image not attached, placement could not be confirmed due to current cardiac rhythm.  ECHOCARDIOGRAM COMPLETE Result Date: 09/10/2024    ECHOCARDIOGRAM REPORT   Patient Name:   [name] Date of Exam: 09/10/2024 Medical Rec #:  [id number]           Height:       61.5 in Accession #:    [id number]          Weight:       214.9 lb Date of Birth:  Sep 08, 1975          BSA:          1.959 m Patient Age:    48 years            BP:           131/85 mmHg Patient Gender: F                   HR:           75 bpm. Exam Location:  Inpatient Procedure: 2D Echo, Cardiac Doppler and Color Doppler (Both Spectral and Color  Flow Doppler were utilized during procedure). Indications:    Atrial Fibrillation I48.91  History:        Patient has prior history of Echocardiogram examinations, most                  recent 10/03/2022. Arrythmias:Atrial Fibrillation; Risk                 Factors:Hypertension, Dyslipidemia, GERD and Current Smoker.  Sonographer:    Koleen Popper RDCS Referring Phys: [id number] MEGAN L [facility]  Sonographer Comments: Suboptimal parasternal window. Image acquisition challenging due to respiratory motion. IMPRESSIONS  1. Left ventricular ejection fraction, by estimation, is 30 to 35%. The left ventricle has moderately decreased function. The left ventricle demonstrates regional wall motion abnormalities (see scoring diagram/findings for description). There is mild concentric left ventricular hypertrophy. Left ventricular diastolic parameters are consistent with Grade II diastolic dysfunction (pseudonormalization). There is the interventricular septum is flattened in diastole ('D' shaped left ventricle), consistent  with right ventricular volume overload.  2. Right ventricular systolic function is mildly reduced. The right ventricular size is moderately enlarged. There is moderately elevated pulmonary artery systolic pressure.  3. Left atrial size was mildly dilated.  4. Right atrial size was moderately dilated.  5. Moderate pleural effusion.  6. The mitral valve is normal in structure. Mild mitral valve regurgitation. No evidence of mitral stenosis.  7. Tricuspid valve regurgitation is moderate to severe.  8. The aortic valve is tricuspid. Aortic valve regurgitation is moderate. No aortic stenosis is present.  9. The inferior vena cava is dilated in size with <50% respiratory variability, suggesting right atrial pressure of 15 mmHg. Comparison(s): Changes from prior study are noted. New biventricular systolic dysfunction, mod-severe TR. FINDINGS  Left Ventricle: Left ventricular ejection fraction, by estimation, is 30 to 35%. The left ventricle has moderately decreased function. The left ventricle demonstrates regional wall motion abnormalities. The left ventricular internal cavity size was  normal in size. There is mild concentric left ventricular hypertrophy. The interventricular septum is flattened in diastole ('D' shaped left ventricle), consistent with right ventricular volume overload. Left ventricular diastolic parameters are consistent with Grade II diastolic dysfunction (pseudonormalization).  LV Wall Scoring: The mid inferoseptal segment and basal inferoseptal segment are akinetic. The entire lateral wall and anterior septum are hypokinetic. Right Ventricle: The right ventricular size is moderately enlarged. No increase in right ventricular wall thickness. Right ventricular systolic function is mildly reduced. There is moderately elevated pulmonary artery systolic pressure. The tricuspid regurgitant velocity is 3.06 m/s, and with an assumed right atrial pressure of 15 mmHg, the estimated right ventricular systolic pressure is 52.5 mmHg. Left Atrium: Left atrial size was mildly dilated. Right Atrium: Right atrial size was moderately dilated. Pericardium: There is no evidence of pericardial effusion. Mitral Valve: The mitral valve is normal in structure. Mild mitral valve regurgitation. No evidence of mitral valve stenosis. Tricuspid Valve: The tricuspid valve is normal in structure. Tricuspid valve regurgitation is moderate to severe. No evidence of tricuspid stenosis. Aortic Valve: The aortic valve is tricuspid. Aortic valve regurgitation is moderate. Aortic regurgitation PHT measures 107 msec. No aortic stenosis is present. Pulmonic Valve: The pulmonic valve was normal in structure. Pulmonic valve regurgitation is not visualized. No evidence of pulmonic stenosis. Aorta: The aortic root and ascending aorta are structurally normal, with no evidence of dilitation. Venous: The inferior vena cava is dilated in size with less than 50% respiratory variability, suggesting right atrial pressure of 15 mmHg. IAS/Shunts: No atrial level  shunt detected by color flow Doppler. Additional Comments: There is  a moderate pleural effusion.  LEFT VENTRICLE PLAX 2D LVIDd:         4.80 cm      Diastology LVIDs:         3.60 cm      LV e' medial:    2.87 cm/s LV PW:         1.30 cm      LV E/e' medial:  35.9 LV IVS:        1.20 cm      LV e' lateral:   7.77 cm/s LVOT diam:     2.00 cm      LV E/e' lateral: 13.3 LV SV:         57 LV SV Index:   29 LVOT Area:     3.14 cm  LV Volumes (MOD) LV vol d, MOD A2C: 122.0 ml LV vol d, MOD A4C: 148.0 ml LV vol s, MOD A2C: 74.1 ml LV vol s, MOD A4C: 88.3 ml LV SV MOD A2C:     47.9 ml LV SV MOD A4C:     148.0 ml LV SV MOD BP:      54.8 ml RIGHT VENTRICLE            IVC RV Basal diam:  5.30 cm    IVC diam: 2.80 cm RV Mid diam:    4.10 cm RV S prime:     8.38 cm/s TAPSE (M-mode): 1.6 cm LEFT ATRIUM             Index        RIGHT ATRIUM           Index LA diam:        4.60 cm 2.35 cm/m   RA Area:     24.90 cm LA Vol (A2C):   82.0 ml 41.85 ml/m  RA Volume:   88.20 ml  45.01 ml/m LA Vol (A4C):   53.8 ml 27.46 ml/m LA Biplane Vol: 68.5 ml 34.96 ml/m  AORTIC VALVE LVOT Vmax:   103.00 cm/s LVOT Vmean:  65.000 cm/s LVOT VTI:    0.181 m AI PHT:      107 msec  AORTA Ao Root diam: 3.20 cm Ao Asc diam:  3.50 cm MITRAL VALVE                TRICUSPID VALVE MV Area (PHT): 4.39 cm     TR Peak grad:   37.5 mmHg MV Decel Time: 173 msec     TR Mean grad:   22.0 mmHg MV E velocity: 103.00 cm/s  TR Vmax:        306.00 cm/s MV A velocity: 57.10 cm/s   TR Vmean:       225.0 cm/s MV E/A ratio:  1.80                             SHUNTS                             Systemic VTI:  0.18 m                             Systemic Diam: 2.00 cm Georganna Archer Electronically signed by Georganna Archer Signature Date/Time: 09/10/2024/12:50:11 PM    Final    IR TUNNELED CENTRAL  VENOUS CATH [facility] IMG Result Date: 09/10/2024 INDICATION: End-stage renal disease requiring hemodialysis. History of SVC syndrome status post venous stenting 2015. EXAM: TUNNELED hemodialysis catheter WITH ULTRASOUND AND FLUOROSCOPIC GUIDANCE  ANESTHESIA/SEDATION: Moderate (conscious) sedation was employed during this procedure. A total of Versed  1.5 mg and Fentanyl  100 mcg was administered intravenously by the radiology nurse. Total intra-service moderate Sedation Time: 31 minutes. The patient's level of consciousness and vital signs were monitored continuously by radiology nursing throughout the procedure under my direct supervision. FLUOROSCOPY: Radiation Exposure Index (as provided by the fluoroscopic device): 61 mGy Kerma COMPLICATIONS: None immediate. PROCEDURE: Informed written consent was obtained from the patient after a discussion of the risks, benefits, and alternatives to treatment. Questions regarding the procedure were encouraged and answered. The left neck and chest were prepped with chlorhexidine  in a sterile fashion, and a sterile drape was applied covering the operative field. Maximum barrier sterile technique with sterile gowns and gloves were used for the procedure. A timeout was performed prior to the initiation of the procedure. After creating a small venotomy incision, a micropuncture kit was utilized to access the left internal jugular vein under direct, real-time ultrasound guidance after the overlying soft tissues were anesthetized with 1% lidocaine  with epinephrine . Ultrasound image documentation was performed. The microwire was kinked to measure appropriate catheter length. The micropuncture sheath was exchanged for a peel-away sheath over a guidewire. A 28 cm palindrome was tunneled in a retrograde fashion from the anterior chest wall to the venotomy incision. The catheter was then placed through the peel-away sheath with tip ultimately positioned at the superior caval-atrial junction. Final catheter positioning was confirmed and documented with a spot radiographic image. The catheter aspirates and flushes normally. The catheter was flushed with appropriate volume heparin  dwells. The catheter exit site was secured with a  0-Prolene retention suture. The venotomy incision was closed with an interrupted 4-0 Vicryl, Dermabond and Steri-strips. Dressings were applied. The patient tolerated the procedure well without immediate post procedural complication. FINDINGS: After catheter placement, the tip lies within the superior cavoatrial junction. The catheter aspirates and flushes normally and is ready for immediate use. IMPRESSION: Successful placement of 28 cm palindrome tunneled hemodialysis catheter via the left internal jugular vein with tip terminating at the superior caval atrial junction. The catheter is ready for immediate use. Electronically Signed   By: Cordella Banner   On: 09/10/2024 09:41   IR PATIENT EVAL TECH 0-60 MINS Result Date: 09/09/2024 Herold Ozell HERO, RT     09/09/2024  5:08 PM IR tech was called to bedside by RN due to bloody dressing and bleeding from tunneled catheter site. Catheter not actively bleeding. New dressing applied with hemostasis achieved   Medications: Infusions:  heparin  1,700 Units/hr (09/10/24 2307)   iron  sucrose 200 mg (09/10/24 2245)    Scheduled Medications:  sodium chloride    Intravenous Once   aspirin  EC  81 mg Oral Daily   atorvastatin   40 mg Oral Daily   Chlorhexidine  Gluconate Cloth  6 each Topical Q0600   cloNIDine   0.2 mg Oral TID   darbepoetin (ARANESP ) injection - DIALYSIS  100 mcg Subcutaneous Q Thu-1800   famotidine   10 mg Oral QHS   ferrous sulfate   325 mg Oral QODAY   lidocaine   1 patch Transdermal Q24H   metoprolol  tartrate  25 mg Oral BID   mycophenolate   720 mg Oral BID   nystatin  cream   Topical BID   polyethylene glycol  17 g Oral Daily  senna  1 tablet Oral Daily   tacrolimus   3 mg Oral BID   triamcinolone  cream  1 Application Topical TID   zolpidem   5 mg Oral QHS    have reviewed scheduled and prn medications.  Physical Exam: General:NAD, comfortable Heart:RRR, s1s2 nl Lungs: Distant breath sound bilateral.  Abdomen:soft, Non-tender,  non-distended Extremities: Bilateral lower extremities pitting edema present Dialysis Access: Left IJ TDC, no active bleed  Gavriel Holzhauer Amelie Romney 09/11/2024,11:03 AM  LOS: 3 days

## 2024-09-11 NOTE — Progress Notes (Signed)
 Patient found trying to get out of the bed. CCMD notified this RN patients heart rate in high 30s. BP 149/92, MAP 111, HR 58, R 21; Oxygen 100% on 2L [place]. MD notified. Awaiting response.

## 2024-09-11 NOTE — Assessment & Plan Note (Addendum)
 Much improved with addition of triamcinolone . Most likely due to volume overload and excoriations.  - Continue triamcinolone  cream 0.1% to affected areas PRN

## 2024-09-11 NOTE — Progress Notes (Signed)
 "  Progress Note  Patient Name: [name] Date of Encounter: 09/11/2024  Primary Cardiologist: None   Subjective   Patient seen and examined at her bedside. Family member present in the room.   Inpatient Medications    Scheduled Meds:  sodium chloride    Intravenous Once   aspirin  EC  81 mg Oral Daily   atorvastatin   40 mg Oral Daily   Chlorhexidine  Gluconate Cloth  6 each Topical Q0600   Chlorhexidine  Gluconate Cloth  6 each Topical Q0600   cloNIDine   0.2 mg Oral TID   darbepoetin (ARANESP ) injection - DIALYSIS  100 mcg Subcutaneous Q Thu-1800   famotidine   10 mg Oral QHS   ferrous sulfate   325 mg Oral QODAY   lidocaine   1 patch Transdermal Q24H   metoprolol  tartrate  25 mg Oral BID   mycophenolate   720 mg Oral BID   nystatin  cream   Topical BID   polyethylene glycol  17 g Oral Daily   senna  1 tablet Oral Daily   tacrolimus   3 mg Oral BID   triamcinolone  cream  1 Application Topical TID   zolpidem   5 mg Oral QHS   Continuous Infusions:  heparin  1,700 Units/hr (09/10/24 2307)   iron  sucrose 200 mg (09/10/24 2245)   PRN Meds: acetaminophen  **OR** acetaminophen , diphenhydrAMINE -zinc  acetate   Vital Signs    Vitals:   09/11/24 0325 09/11/24 0445 09/11/24 0446 09/11/24 0700  BP: 136/88 119/80  104/76  Pulse: 63   61  Resp: (!) 9   20  Temp: 98 F (36.7 C)   (!) 97.3 F (36.3 C)  TempSrc: Oral   Oral  SpO2: 94%   98%  Weight:   89.5 kg   Height:        Intake/Output Summary (Last 24 hours) at 09/11/2024 1210 Last data filed at 09/11/2024 0700 Gross per 24 hour  Intake 815.86 ml  Output 1300 ml  Net -484.14 ml   Filed Weights   09/09/24 0446 09/10/24 0500 09/11/24 0446  Weight: 97.2 kg 97.5 kg 89.5 kg    Telemetry    Sinus bradycardia HR in the 50s - Personally Reviewed  ECG     - Personally Reviewed  Physical Exam    General: Comfortable, Head: Atraumatic, normal size  Eyes: PEERLA, EOMI  Neck: Supple, normal JVD Cardiac: Normal S1,  S2; RRR; no murmurs, rubs, or gallops Lungs: Clear to auscultation bilaterally Abd: Soft, nontender, no hepatomegaly  Ext: warm, no edema Musculoskeletal: No deformities, BUE and BLE strength normal and equal Skin: Warm and dry, no rashes   Neuro: Alert and oriented to person, place, time, and situation, CNII-XII grossly intact, no focal deficits  Psych: Normal mood and affect   Labs    Chemistry Recent Labs  Lab 09/09/24 0109 09/10/24 0440 09/11/24 0725  NA 131* 130* 134*  K 5.0 4.3 4.4  CL 98 98 99  CO2 15* 18* 21*  GLUCOSE 85 90 76  BUN 97* 77* 53*  CREATININE 6.89* 5.47* 4.54*  CALCIUM  8.0* 7.6* 7.7*  ALBUMIN 2.2* 2.0* 2.0*  GFRNONAA 7* 9* 11*  ANIONGAP 18* 14 14     Hematology Recent Labs  Lab 09/10/24 0440 09/10/24 1053 09/10/24 2131 09/11/24 0725  WBC 6.9 7.6  --  7.9  RBC 3.01* 2.92*  --  3.34*  HGB 6.9* 6.7* 8.3* 8.0*  HCT 22.8* 22.7* 27.6* 26.0*  MCV 75.7* 77.7*  --  77.8*  MCH 22.9* 22.9*  --  24.0*  MCHC 30.3 29.5*  --  30.8  RDW 16.9* 17.0*  --  16.8*  PLT 320 291  --  316    Cardiac EnzymesNo results for input(s): TROPONINI in the last 168 hours. No results for input(s): TROPIPOC in the last 168 hours.   BNP Recent Labs  Lab 09/08/24 1929  PROBNP >35,000.0*     DDimer No results for input(s): DDIMER in the last 168 hours.   Radiology    DG Chest Port 1 View Result Date: 09/10/2024 CLINICAL DATA:  Central line placement EXAM: PORTABLE CHEST 1 VIEW COMPARISON:  09/08/2024 FINDINGS: Single frontal view of the chest demonstrates left-sided internal jugular central venous catheter, tip overlying the atriocaval junction. Stable venous stents are seen within the SVC and left brachiocephalic vein. Stable enlargement of the cardiac silhouette. Continue veiling opacities at the lung bases consistent with consolidation and effusions, right greater than left. No pneumothorax. No acute bony abnormalities. IMPRESSION: 1. No complication after left  internal jugular dialysis catheter placement. 2. Stable enlarged cardiac silhouette and bibasilar consolidation/effusions. Electronically Signed   By: Ozell Daring M.D.   On: 09/10/2024 17:16   US  EKG SITE RITE Result Date: 09/10/2024 If Site Rite image not attached, placement could not be confirmed due to current cardiac rhythm.  ECHOCARDIOGRAM COMPLETE Result Date: 09/10/2024    ECHOCARDIOGRAM REPORT   Patient Name:   [name] Date of Exam: 09/10/2024 Medical Rec #:  [id number]           Height:       61.5 in Accession #:    [id number]          Weight:       214.9 lb Date of Birth:  1976-03-01          BSA:          1.959 m Patient Age:    48 years            BP:           131/85 mmHg Patient Gender: F                   HR:           75 bpm. Exam Location:  Inpatient Procedure: 2D Echo, Cardiac Doppler and Color Doppler (Both Spectral and Color            Flow Doppler were utilized during procedure). Indications:    Atrial Fibrillation I48.91  History:        Patient has prior history of Echocardiogram examinations, most                 recent 10/03/2022. Arrythmias:Atrial Fibrillation; Risk                 Factors:Hypertension, Dyslipidemia, GERD and Current Smoker.  Sonographer:    Koleen Popper RDCS Referring Phys: [id number] MEGAN L [facility]  Sonographer Comments: Suboptimal parasternal window. Image acquisition challenging due to respiratory motion. IMPRESSIONS  1. Left ventricular ejection fraction, by estimation, is 30 to 35%. The left ventricle has moderately decreased function. The left ventricle demonstrates regional wall motion abnormalities (see scoring diagram/findings for description). There is mild concentric left ventricular hypertrophy. Left ventricular diastolic parameters are consistent with Grade II diastolic dysfunction (pseudonormalization). There is the interventricular septum is flattened in diastole ('D' shaped left ventricle), consistent  with right ventricular volume  overload.  2. Right ventricular systolic function is mildly reduced. The right ventricular size is moderately  enlarged. There is moderately elevated pulmonary artery systolic pressure.  3. Left atrial size was mildly dilated.  4. Right atrial size was moderately dilated.  5. Moderate pleural effusion.  6. The mitral valve is normal in structure. Mild mitral valve regurgitation. No evidence of mitral stenosis.  7. Tricuspid valve regurgitation is moderate to severe.  8. The aortic valve is tricuspid. Aortic valve regurgitation is moderate. No aortic stenosis is present.  9. The inferior vena cava is dilated in size with <50% respiratory variability, suggesting right atrial pressure of 15 mmHg. Comparison(s): Changes from prior study are noted. New biventricular systolic dysfunction, mod-severe TR. FINDINGS  Left Ventricle: Left ventricular ejection fraction, by estimation, is 30 to 35%. The left ventricle has moderately decreased function. The left ventricle demonstrates regional wall motion abnormalities. The left ventricular internal cavity size was normal in size. There is mild concentric left ventricular hypertrophy. The interventricular septum is flattened in diastole ('D' shaped left ventricle), consistent with right ventricular volume overload. Left ventricular diastolic parameters are consistent with Grade II diastolic dysfunction (pseudonormalization).  LV Wall Scoring: The mid inferoseptal segment and basal inferoseptal segment are akinetic. The entire lateral wall and anterior septum are hypokinetic. Right Ventricle: The right ventricular size is moderately enlarged. No increase in right ventricular wall thickness. Right ventricular systolic function is mildly reduced. There is moderately elevated pulmonary artery systolic pressure. The tricuspid regurgitant velocity is 3.06 m/s, and with an assumed right atrial pressure of 15 mmHg, the estimated right ventricular systolic pressure is 52.5 mmHg. Left  Atrium: Left atrial size was mildly dilated. Right Atrium: Right atrial size was moderately dilated. Pericardium: There is no evidence of pericardial effusion. Mitral Valve: The mitral valve is normal in structure. Mild mitral valve regurgitation. No evidence of mitral valve stenosis. Tricuspid Valve: The tricuspid valve is normal in structure. Tricuspid valve regurgitation is moderate to severe. No evidence of tricuspid stenosis. Aortic Valve: The aortic valve is tricuspid. Aortic valve regurgitation is moderate. Aortic regurgitation PHT measures 107 msec. No aortic stenosis is present. Pulmonic Valve: The pulmonic valve was normal in structure. Pulmonic valve regurgitation is not visualized. No evidence of pulmonic stenosis. Aorta: The aortic root and ascending aorta are structurally normal, with no evidence of dilitation. Venous: The inferior vena cava is dilated in size with less than 50% respiratory variability, suggesting right atrial pressure of 15 mmHg. IAS/Shunts: No atrial level shunt detected by color flow Doppler. Additional Comments: There is a moderate pleural effusion.  LEFT VENTRICLE PLAX 2D LVIDd:         4.80 cm      Diastology LVIDs:         3.60 cm      LV e' medial:    2.87 cm/s LV PW:         1.30 cm      LV E/e' medial:  35.9 LV IVS:        1.20 cm      LV e' lateral:   7.77 cm/s LVOT diam:     2.00 cm      LV E/e' lateral: 13.3 LV SV:         57 LV SV Index:   29 LVOT Area:     3.14 cm  LV Volumes (MOD) LV vol d, MOD A2C: 122.0 ml LV vol d, MOD A4C: 148.0 ml LV vol s, MOD A2C: 74.1 ml LV vol s, MOD A4C: 88.3 ml LV SV MOD A2C:     47.9  ml LV SV MOD A4C:     148.0 ml LV SV MOD BP:      54.8 ml RIGHT VENTRICLE            IVC RV Basal diam:  5.30 cm    IVC diam: 2.80 cm RV Mid diam:    4.10 cm RV S prime:     8.38 cm/s TAPSE (M-mode): 1.6 cm LEFT ATRIUM             Index        RIGHT ATRIUM           Index LA diam:        4.60 cm 2.35 cm/m   RA Area:     24.90 cm LA Vol (A2C):   82.0 ml 41.85  ml/m  RA Volume:   88.20 ml  45.01 ml/m LA Vol (A4C):   53.8 ml 27.46 ml/m LA Biplane Vol: 68.5 ml 34.96 ml/m  AORTIC VALVE LVOT Vmax:   103.00 cm/s LVOT Vmean:  65.000 cm/s LVOT VTI:    0.181 m AI PHT:      107 msec  AORTA Ao Root diam: 3.20 cm Ao Asc diam:  3.50 cm MITRAL VALVE                TRICUSPID VALVE MV Area (PHT): 4.39 cm     TR Peak grad:   37.5 mmHg MV Decel Time: 173 msec     TR Mean grad:   22.0 mmHg MV E velocity: 103.00 cm/s  TR Vmax:        306.00 cm/s MV A velocity: 57.10 cm/s   TR Vmean:       225.0 cm/s MV E/A ratio:  1.80                             SHUNTS                             Systemic VTI:  0.18 m                             Systemic Diam: 2.00 cm Georganna Archer Electronically signed by Georganna Archer Signature Date/Time: 09/10/2024/12:50:11 PM    Final    IR TUNNELED CENTRAL VENOUS CATH [facility] W IMG Result Date: 09/10/2024 INDICATION: End-stage renal disease requiring hemodialysis. History of SVC syndrome status post venous stenting 2015. EXAM: TUNNELED hemodialysis catheter WITH ULTRASOUND AND FLUOROSCOPIC GUIDANCE ANESTHESIA/SEDATION: Moderate (conscious) sedation was employed during this procedure. A total of Versed  1.5 mg and Fentanyl  100 mcg was administered intravenously by the radiology nurse. Total intra-service moderate Sedation Time: 31 minutes. The patient's level of consciousness and vital signs were monitored continuously by radiology nursing throughout the procedure under my direct supervision. FLUOROSCOPY: Radiation Exposure Index (as provided by the fluoroscopic device): 61 mGy Kerma COMPLICATIONS: None immediate. PROCEDURE: Informed written consent was obtained from the patient after a discussion of the risks, benefits, and alternatives to treatment. Questions regarding the procedure were encouraged and answered. The left neck and chest were prepped with chlorhexidine  in a sterile fashion, and a sterile drape was applied covering the operative field. Maximum  barrier sterile technique with sterile gowns and gloves were used for the procedure. A timeout was performed prior to the initiation of the procedure. After creating a small venotomy incision, a micropuncture kit  was utilized to access the left internal jugular vein under direct, real-time ultrasound guidance after the overlying soft tissues were anesthetized with 1% lidocaine  with epinephrine . Ultrasound image documentation was performed. The microwire was kinked to measure appropriate catheter length. The micropuncture sheath was exchanged for a peel-away sheath over a guidewire. A 28 cm palindrome was tunneled in a retrograde fashion from the anterior chest wall to the venotomy incision. The catheter was then placed through the peel-away sheath with tip ultimately positioned at the superior caval-atrial junction. Final catheter positioning was confirmed and documented with a spot radiographic image. The catheter aspirates and flushes normally. The catheter was flushed with appropriate volume heparin  dwells. The catheter exit site was secured with a 0-Prolene retention suture. The venotomy incision was closed with an interrupted 4-0 Vicryl, Dermabond and Steri-strips. Dressings were applied. The patient tolerated the procedure well without immediate post procedural complication. FINDINGS: After catheter placement, the tip lies within the superior cavoatrial junction. The catheter aspirates and flushes normally and is ready for immediate use. IMPRESSION: Successful placement of 28 cm palindrome tunneled hemodialysis catheter via the left internal jugular vein with tip terminating at the superior caval atrial junction. The catheter is ready for immediate use. Electronically Signed   By: Cordella Banner   On: 09/10/2024 09:41   IR PATIENT EVAL TECH 0-60 MINS Result Date: 09/09/2024 Herold Ozell HERO, RT     09/09/2024  5:08 PM IR tech was called to bedside by RN due to bloody dressing and bleeding from tunneled  catheter site. Catheter not actively bleeding. New dressing applied with hemostasis achieved   Cardiac Studies   Echo   Patient Profile     49 y.o. female with new onset atrial fibrillation  Assessment & Plan    New onset Atrial fibrillation  Anemia of Chronic disease  Acute volume overload: renal failure and heart failure Elevated Troponin, not ACS Hypertension  Coronary artery calcification  Clinically still in volume overloaded, she is getting hemodialysis having this will help greatly.  Will continue to monitor plans for diuretics use.  It appears that her hemodialysis is planned for today.  With her new onset atrial fibrillation she has been started on heparin  drip will like to transition her to Eliquis  prior to discharge.  She is on aspirin  81 mg daily, was taken off Plavix .  With this patient being discharged on anticoagulation and with her chronic anemia I am concerned about keeping her on aspirin  for discharge.  She has SVC stent placement it will be beneficial for vascular surgery to make a comment on the use of antiplatelet or not. Recent elevated troponin but flat no need for any ischemic evaluation at this time.  Suspect this is in the setting of her renal failure no anginal symptoms.  Blood pressure acceptable will continue current regimen.     For questions or updates, please contact [facility] Please consult [email or web address] for contact info under Cardiology/STEMI.      Signed, Cynde Menard, DO  09/11/2024, 12:10 PM    "

## 2024-09-12 DIAGNOSIS — N186 End stage renal disease: Secondary | ICD-10-CM | POA: Insufficient documentation

## 2024-09-12 DIAGNOSIS — I5041 Acute combined systolic (congestive) and diastolic (congestive) heart failure: Secondary | ICD-10-CM | POA: Diagnosis not present

## 2024-09-12 DIAGNOSIS — N185 Chronic kidney disease, stage 5: Secondary | ICD-10-CM | POA: Diagnosis not present

## 2024-09-12 LAB — RENAL FUNCTION PANEL
Albumin: 2 g/dL — ABNORMAL LOW (ref 3.5–5.0)
Anion gap: 15 (ref 5–15)
BUN: 61 mg/dL — ABNORMAL HIGH (ref 6–20)
CO2: 19 mmol/L — ABNORMAL LOW (ref 22–32)
Calcium: 7.9 mg/dL — ABNORMAL LOW (ref 8.9–10.3)
Chloride: 97 mmol/L — ABNORMAL LOW (ref 98–111)
Creatinine, Ser: 5.12 mg/dL — ABNORMAL HIGH (ref 0.44–1.00)
GFR, Estimated: 10 mL/min — ABNORMAL LOW
Glucose, Bld: 97 mg/dL (ref 70–99)
Phosphorus: 4.9 mg/dL — ABNORMAL HIGH (ref 2.5–4.6)
Potassium: 4.9 mmol/L (ref 3.5–5.1)
Sodium: 131 mmol/L — ABNORMAL LOW (ref 135–145)

## 2024-09-12 LAB — CBC
HCT: 27.3 % — ABNORMAL LOW (ref 36.0–46.0)
Hemoglobin: 8.2 g/dL — ABNORMAL LOW (ref 12.0–15.0)
MCH: 23.8 pg — ABNORMAL LOW (ref 26.0–34.0)
MCHC: 30 g/dL (ref 30.0–36.0)
MCV: 79.1 fL — ABNORMAL LOW (ref 80.0–100.0)
Platelets: 287 10*3/uL (ref 150–400)
RBC: 3.45 MIL/uL — ABNORMAL LOW (ref 3.87–5.11)
RDW: 17.1 % — ABNORMAL HIGH (ref 11.5–15.5)
WBC: 10.2 10*3/uL (ref 4.0–10.5)
nRBC: 0.3 % — ABNORMAL HIGH (ref 0.0–0.2)

## 2024-09-12 LAB — HEPARIN LEVEL (UNFRACTIONATED): Heparin Unfractionated: 0.51 [IU]/mL (ref 0.30–0.70)

## 2024-09-12 LAB — MAGNESIUM: Magnesium: 2.3 mg/dL (ref 1.7–2.4)

## 2024-09-12 MED ORDER — HEPARIN SODIUM (PORCINE) 1000 UNIT/ML IJ SOLN
INTRAMUSCULAR | Status: AC
  Filled 2024-09-12: qty 5

## 2024-09-12 MED ORDER — ANTICOAGULANT SODIUM CITRATE 4% (200MG/5ML) IV SOLN: 5.0000 mL | Status: DC | PRN

## 2024-09-12 MED ORDER — PENTAFLUOROPROP-TETRAFLUOROETH EX AERO: 1.0000 | INHALATION_SPRAY | CUTANEOUS | Status: DC | PRN

## 2024-09-12 MED ORDER — APIXABAN 5 MG PO TABS
5.0000 mg | ORAL_TABLET | Freq: Two times a day (BID) | ORAL | Status: AC
  Administered 2024-09-12 – 2024-09-17 (×11): 5 mg via ORAL
  Filled 2024-09-12 (×11): qty 1

## 2024-09-12 MED ORDER — LIDOCAINE 4 % EX CREA
TOPICAL_CREAM | Freq: Three times a day (TID) | CUTANEOUS | Status: AC | PRN
  Administered 2024-09-12 – 2024-09-16 (×3): 1 via TOPICAL
  Filled 2024-09-12: qty 5

## 2024-09-12 MED ORDER — ALTEPLASE 2 MG IJ SOLR: 2.0000 mg | Freq: Once | INTRAMUSCULAR | Status: DC | PRN

## 2024-09-12 MED ORDER — LIDOCAINE HCL (PF) 1 % IJ SOLN: 5.0000 mL | INTRAMUSCULAR | Status: DC | PRN

## 2024-09-12 MED ORDER — LIDOCAINE-PRILOCAINE 2.5-2.5 % EX CREA: 1.0000 | TOPICAL_CREAM | CUTANEOUS | Status: DC | PRN

## 2024-09-12 MED ORDER — HEPARIN SODIUM (PORCINE) 1000 UNIT/ML DIALYSIS
1000.0000 [IU] | INTRAMUSCULAR | Status: DC | PRN
  Administered 2024-09-12: 4200 [IU]

## 2024-09-12 NOTE — Progress Notes (Signed)
 [name] NEPHROLOGY PROGRESS NOTE  Assessment/ Plan: Pt is a 49 y.o. yo female  with past medical history significant for obesity, hypertension, anemia, dyslipidemia, SVC syndrome, ESRD status post LRKT in 2010 at [facility] presented with worsening shortness of breath, fluid overload, seen as a consultation for the management of new ESRD.   # New ESRD after failed kidney transplant with evi acute dence of uremia and fluid overload. -s/p [facility]J TDC by IR on 1/29 and received first dialysis.  Renal navigator is following to arrange outpatient HD for ESRD, going to TCU. 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  -She had dialysis twice and tolerated well.   - Unable to do dialysis yesterday because of shortage staffing/inclement weather, we will try to do dialysis today or tomorrow depending on nursing staff.   # Kidney transplant LRKT (mother) in 2010 at [facility]: Continue Prograf  and Myfortic  at home dose.   # Acute CHF exacerbation: Elevated BNP, UF with dialysis.  Cardiology is following.   # New onset A-fib with RVR: Currently on heparin  and managing by cardiologist.   # Anemia of CKD: Noted iron  saturation of 6%.  Ordered IV iron  and started Aranesp .  Also required blood transfusion.  Monitor lab.   # CKD-MBD: Check PTH level, hyperphosphatemia improved with dialysis.   # Hyponatremia, hypervolemia: UF with HD, fluid restriction and monitor lab.  Subjective: Seen and examined at bedside.  Noted she was delirious yesterday.  She was alert awake but reports tired to this morning.  Her partner at the bedside.  Unable to do dialysis yesterday because of staffing issue. Objective Vital signs in last 24 hours: Vitals:   09/11/24 2352 09/12/24 0239 09/12/24 0304 09/12/24 0900  BP: (!) 135/104 (!) 131/111  (!) 141/83  Pulse: (!) 54 (!) 57 (!) 55 (!) 55  Resp: 19 15 15 14   Temp: 97.6 F (36.4 C) 97.9 F (36.6  C)  (!) 97.5 F (36.4 C)  TempSrc: Axillary Oral  Oral  SpO2: 100% 95% 91% 93%  Weight:   88.2 kg   Height:       Weight change: -1.3 kg  Intake/Output Summary (Last 24 hours) at 09/12/2024 1104 Last data filed at 09/12/2024 0557 Gross per 24 hour  Intake 728.69 ml  Output --  Net 728.69 ml       Labs: RENAL PANEL Recent Labs  Lab 09/08/24 1929 09/08/24 1939 09/09/24 0109 09/10/24 0440 09/11/24 0725 09/12/24 0219  NA 131* 131* 131* 130* 134* 131*  K 4.9 4.8 5.0 4.3 4.4 4.9  CL 99 103 98 98 99 97*  CO2 15*  --  15* 18* 21* 19*  GLUCOSE 96 95 85 90 76 97  BUN 96* 103* 97* 77* 53* 61*  CREATININE 6.87* 7.40* 6.89* 5.47* 4.54* 5.12*  CALCIUM  8.0*  --  8.0* 7.6* 7.7* 7.9*  MG  --   --  2.0 1.9 2.1 2.3  PHOS  --   --  5.9* 5.1* 3.9 4.9*  ALBUMIN  --   --  2.2* 2.0* 2.0* 2.0*    Liver Function Tests: Recent Labs  Lab 09/10/24 0440 09/11/24 0725 09/12/24 0219  ALBUMIN 2.0* 2.0* 2.0*   No results for input(s): LIPASE, AMYLASE in the last 168 hours. No results for input(s): AMMONIA in the last 168 hours. CBC: Recent Labs    09/09/24 0309 09/10/24 0440 09/10/24 1053 09/10/24 2131 09/11/24  0725 09/12/24 0219  HGB  --  6.9* 6.7* 8.3* 8.0* 8.2*  MCV  --  75.7* 77.7*  --  77.8* 79.1*  VITAMINB12 1,346*  --   --   --   --   --   FOLATE 9.4  --   --   --   --   --   FERRITIN 169  --   --   --   --   --   TIBC 239*  --   --   --   --   --   IRON  13*  --   --   --   --   --   RETICCTPCT 2.1  --   --   --   --   --     Cardiac Enzymes: No results for input(s): CKTOTAL, CKMB, CKMBINDEX, TROPONINI in the last 168 hours. CBG: No results for input(s): GLUCAP in the last 168 hours.  Iron  Studies:  No results for input(s): IRON , TIBC, TRANSFERRIN, FERRITIN in the last 72 hours.  Studies/Results: US  EKG SITE RITE Result Date: 09/11/2024 If Site Rite image not attached, placement could not be confirmed due to current cardiac rhythm.  DG  Chest Port 1 View Result Date: 09/10/2024 CLINICAL DATA:  Central line placement EXAM: PORTABLE CHEST 1 VIEW COMPARISON:  09/08/2024 FINDINGS: Single frontal view of the chest demonstrates left-sided internal jugular central venous catheter, tip overlying the atriocaval junction. Stable venous stents are seen within the SVC and left brachiocephalic vein. Stable enlargement of the cardiac silhouette. Continue veiling opacities at the lung bases consistent with consolidation and effusions, right greater than left. No pneumothorax. No acute bony abnormalities. IMPRESSION: 1. No complication after left internal jugular dialysis catheter placement. 2. Stable enlarged cardiac silhouette and bibasilar consolidation/effusions. Electronically Signed   By: Ozell Daring M.D.   On: 09/10/2024 17:16   US  EKG SITE RITE Result Date: 09/10/2024 If Site Rite image not attached, placement could not be confirmed due to current cardiac rhythm.  ECHOCARDIOGRAM COMPLETE Result Date: 09/10/2024    ECHOCARDIOGRAM REPORT   Patient Name:   [name] Date of Exam: 09/10/2024 Medical Rec #:  [id number]           Height:       61.5 in Accession #:    [id number]          Weight:       214.9 lb Date of Birth:  1976/01/28          BSA:          1.959 m Patient Age:    48 years            BP:           131/85 mmHg Patient Gender: F                   HR:           75 bpm. Exam Location:  Inpatient Procedure: 2D Echo, Cardiac Doppler and Color Doppler (Both Spectral and Color            Flow Doppler were utilized during procedure). Indications:    Atrial Fibrillation I48.91  History:        Patient has prior history of Echocardiogram examinations, most                 recent 10/03/2022. Arrythmias:Atrial Fibrillation; Risk  Factors:Hypertension, Dyslipidemia, GERD and Current Smoker.  Sonographer:    Koleen Popper RDCS Referring Phys: [id number] MEGAN L [facility]  Sonographer Comments: Suboptimal parasternal window. Image  acquisition challenging due to respiratory motion. IMPRESSIONS  1. Left ventricular ejection fraction, by estimation, is 30 to 35%. The left ventricle has moderately decreased function. The left ventricle demonstrates regional wall motion abnormalities (see scoring diagram/findings for description). There is mild concentric left ventricular hypertrophy. Left ventricular diastolic parameters are consistent with Grade II diastolic dysfunction (pseudonormalization). There is the interventricular septum is flattened in diastole ('D' shaped left ventricle), consistent  with right ventricular volume overload.  2. Right ventricular systolic function is mildly reduced. The right ventricular size is moderately enlarged. There is moderately elevated pulmonary artery systolic pressure.  3. Left atrial size was mildly dilated.  4. Right atrial size was moderately dilated.  5. Moderate pleural effusion.  6. The mitral valve is normal in structure. Mild mitral valve regurgitation. No evidence of mitral stenosis.  7. Tricuspid valve regurgitation is moderate to severe.  8. The aortic valve is tricuspid. Aortic valve regurgitation is moderate. No aortic stenosis is present.  9. The inferior vena cava is dilated in size with <50% respiratory variability, suggesting right atrial pressure of 15 mmHg. Comparison(s): Changes from prior study are noted. New biventricular systolic dysfunction, mod-severe TR. FINDINGS  Left Ventricle: Left ventricular ejection fraction, by estimation, is 30 to 35%. The left ventricle has moderately decreased function. The left ventricle demonstrates regional wall motion abnormalities. The left ventricular internal cavity size was normal in size. There is mild concentric left ventricular hypertrophy. The interventricular septum is flattened in diastole ('D' shaped left ventricle), consistent with right ventricular volume overload. Left ventricular diastolic parameters are consistent with Grade II diastolic  dysfunction (pseudonormalization).  LV Wall Scoring: The mid inferoseptal segment and basal inferoseptal segment are akinetic. The entire lateral wall and anterior septum are hypokinetic. Right Ventricle: The right ventricular size is moderately enlarged. No increase in right ventricular wall thickness. Right ventricular systolic function is mildly reduced. There is moderately elevated pulmonary artery systolic pressure. The tricuspid regurgitant velocity is 3.06 m/s, and with an assumed right atrial pressure of 15 mmHg, the estimated right ventricular systolic pressure is 52.5 mmHg. Left Atrium: Left atrial size was mildly dilated. Right Atrium: Right atrial size was moderately dilated. Pericardium: There is no evidence of pericardial effusion. Mitral Valve: The mitral valve is normal in structure. Mild mitral valve regurgitation. No evidence of mitral valve stenosis. Tricuspid Valve: The tricuspid valve is normal in structure. Tricuspid valve regurgitation is moderate to severe. No evidence of tricuspid stenosis. Aortic Valve: The aortic valve is tricuspid. Aortic valve regurgitation is moderate. Aortic regurgitation PHT measures 107 msec. No aortic stenosis is present. Pulmonic Valve: The pulmonic valve was normal in structure. Pulmonic valve regurgitation is not visualized. No evidence of pulmonic stenosis. Aorta: The aortic root and ascending aorta are structurally normal, with no evidence of dilitation. Venous: The inferior vena cava is dilated in size with less than 50% respiratory variability, suggesting right atrial pressure of 15 mmHg. IAS/Shunts: No atrial level shunt detected by color flow Doppler. Additional Comments: There is a moderate pleural effusion.  LEFT VENTRICLE PLAX 2D LVIDd:         4.80 cm      Diastology LVIDs:         3.60 cm      LV e' medial:    2.87 cm/s LV PW:  1.30 cm      LV E/e' medial:  35.9 LV IVS:        1.20 cm      LV e' lateral:   7.77 cm/s LVOT diam:     2.00 cm       LV E/e' lateral: 13.3 LV SV:         57 LV SV Index:   29 LVOT Area:     3.14 cm  LV Volumes (MOD) LV vol d, MOD A2C: 122.0 ml LV vol d, MOD A4C: 148.0 ml LV vol s, MOD A2C: 74.1 ml LV vol s, MOD A4C: 88.3 ml LV SV MOD A2C:     47.9 ml LV SV MOD A4C:     148.0 ml LV SV MOD BP:      54.8 ml RIGHT VENTRICLE            IVC RV Basal diam:  5.30 cm    IVC diam: 2.80 cm RV Mid diam:    4.10 cm RV S prime:     8.38 cm/s TAPSE (M-mode): 1.6 cm LEFT ATRIUM             Index        RIGHT ATRIUM           Index LA diam:        4.60 cm 2.35 cm/m   RA Area:     24.90 cm LA Vol (A2C):   82.0 ml 41.85 ml/m  RA Volume:   88.20 ml  45.01 ml/m LA Vol (A4C):   53.8 ml 27.46 ml/m LA Biplane Vol: 68.5 ml 34.96 ml/m  AORTIC VALVE LVOT Vmax:   103.00 cm/s LVOT Vmean:  65.000 cm/s LVOT VTI:    0.181 m AI PHT:      107 msec  AORTA Ao Root diam: 3.20 cm Ao Asc diam:  3.50 cm MITRAL VALVE                TRICUSPID VALVE MV Area (PHT): 4.39 cm     TR Peak grad:   37.5 mmHg MV Decel Time: 173 msec     TR Mean grad:   22.0 mmHg MV E velocity: 103.00 cm/s  TR Vmax:        306.00 cm/s MV A velocity: 57.10 cm/s   TR Vmean:       225.0 cm/s MV E/A ratio:  1.80                             SHUNTS                             Systemic VTI:  0.18 m                             Systemic Diam: 2.00 cm Georganna Archer Electronically signed by Georganna Archer Signature Date/Time: 09/10/2024/12:50:11 PM    Final     Medications: Infusions:  heparin  1,700 Units/hr (09/12/24 0557)    Scheduled Medications:  sodium chloride    Intravenous Once   aspirin  EC  81 mg Oral Daily   atorvastatin   40 mg Oral Daily   Chlorhexidine  Gluconate Cloth  6 each Topical Q0600   Chlorhexidine  Gluconate Cloth  6 each Topical Q0600   cloNIDine   0.2 mg Oral TID   darbepoetin (ARANESP ) injection - DIALYSIS  100 mcg Subcutaneous Q Thu-1800   famotidine   10 mg Oral QHS   ferrous sulfate   325 mg Oral QODAY   lidocaine   1 patch Transdermal Q24H   metoprolol   tartrate  25 mg Oral BID   mycophenolate   720 mg Oral BID   nystatin  cream   Topical BID   polyethylene glycol  17 g Oral Daily   senna  1 tablet Oral Daily   tacrolimus   3 mg Oral BID   triamcinolone  cream  1 Application Topical TID   zolpidem   5 mg Oral QHS    have reviewed scheduled and prn medications.  Physical Exam: General:NAD, comfortable Heart:RRR, s1s2 nl Lungs: Distant breath sound bilateral.  Abdomen:soft, Non-tender, non-distended Extremities: Bilateral lower extremities pitting edema present Dialysis Access: Left IJ TDC, no active bleed  Derk Doubek Amelie Romney 09/12/2024,11:04 AM  LOS: 4 days

## 2024-09-12 NOTE — Assessment & Plan Note (Addendum)
 Will continue triamcinolone  cream 0.1% to affected areas prn

## 2024-09-12 NOTE — Plan of Care (Signed)
 Called Dr. Serene, [facility], to discuss antiplatelet use for SVC stent (previously on ASA and plavix ). Per Dr. Serene, he recommends d/c the ASA and plavix  at discharge if patient is going to start eliquis  per cardiology recs. - d/c ASA  - continue eliquis  per cardiology recs - Dr. Serene will not formally see patient, but is available for further consultation   Gloriann Ogren, MD PGY-2 [facility] 09/12/24 1:17 PM

## 2024-09-12 NOTE — Assessment & Plan Note (Addendum)
 New ESRD after kidney failure w evidence of uremia and volume overload. Patient now s/p [facility] 1/29 and receiving dialysis.  Hg appears stable at 8.2 overnight. Patient is s/p 1U pRBC on 1/30.  - Nephrology following, appreciate recs   - continue HD for UF and electrolyte abnormalities   - continue aranesp  100 mcg weekly   - continue ferrous sulfate  325 every other day  - Daily RFP, plan to replete electrolytes prn - Continue home prograf  (3 mg BID) and myfortic  (720 mg BID)  - Labs pending: PTH, lipoprotein A

## 2024-09-12 NOTE — Progress Notes (Signed)
" °   09/12/24 1605  Vitals  Temp 98.1 F (36.7 C)  Pulse Rate 62  Resp 14  BP (!) 158/88  SpO2 92 %  O2 Device Room Air  Weight 94.9 kg  Type of Weight Post-Dialysis  Oxygen Therapy  Patient Activity (if Appropriate) In bed  Pulse Oximetry Type Continuous  Oximetry Probe Site Changed No  Post Treatment  Dialyzer Clearance Lightly streaked  Hemodialysis Intake (mL) 0 mL  Liters Processed 72  Fluid Removed (mL) 2000 mL  Tolerated HD Treatment Yes    "

## 2024-09-12 NOTE — Progress Notes (Signed)
 PHARMACY - ANTICOAGULATION CONSULT NOTE  Pharmacy Consult for heparin  Indication: atrial fibrillation  Allergies[1]  Patient Measurements: Height: 5' 1.5 (156.2 cm) Weight: 88.2 kg (194 lb 7.1 oz) IBW/kg (Calculated) : 48.95 HEPARIN  DW (KG): 70  Vital Signs: Temp: 97.9 F (36.6 C) (02/01 0239) Temp Source: Oral (02/01 0239) BP: 131/111 (02/01 0239) Pulse Rate: 55 (02/01 0304)  Labs: Recent Labs    09/10/24 0440 09/10/24 1053 09/10/24 2131 09/11/24 0725 09/11/24 1529 09/12/24 0219  HGB 6.9* 6.7* 8.3* 8.0*  --  8.2*  HCT 22.8* 22.7* 27.6* 26.0*  --  27.3*  PLT 320 291  --  316  --  287  HEPARINUNFRC 0.31 0.16* 0.20* 0.48 0.48 0.51  CREATININE 5.47*  --   --  4.54*  --  5.12*    Estimated Creatinine Clearance: 14 mL/min (A) (by C-G formula based on SCr of 5.12 mg/dL (H)).  Assessment: 12 yoF presented with shortness of breath. Pharmacy consulted to dose heparin  for afib.  -Hx of SVC syndrome w/ extensive central venous stenosis s/p venous stent placed in 2015 on DAPT -Hgb 7s (bl~7-8)-anemia of chronic kidney disease, plts WNL -No prior oral anticoagulation   Heparin  level therapeutic at 0.51 on 1700 units/hr. No issues with the infusion or bleeding reported.  Goal of Therapy:  Heparin  level 0.3-0.7 units/ml Monitor platelets by anticoagulation protocol: Yes   Plan:  Continue heparin  at 1700 units/hr Daily heparin  level and CBC F/u transition to DOAC  Dionicia Canavan, PharmD, RPh PGY1 Acute Care Pharmacy Resident  09/12/2024 7:47 AM   Please check AMION for all [facility] Pharmacy phone numbers After 10:00 PM, call Main Pharmacy [phone]           [1]  Allergies Allergen Reactions   Metoclopramide Anxiety and Other (See Comments)   Ciprofloxacin Nausea Only   Morphine Other (See Comments)    Headache /migraine    Vancomycin     gives patient red man syndrome

## 2024-09-12 NOTE — Assessment & Plan Note (Addendum)
 Rates overnight 50s-60s overnight. Reports some exertional dyspnea.  - Cardiology following, appreciate recs  - continue diuresis in dialysis   - continue lopressor  25 mg BID - consider consolidation to succinate prior to dc   - continue IV heparin , will need to transition to Eliquis  prior to d/c   - transfusion threshold < 7  - will plan to discuss antiplatelet use for SVC stent and concurrent anemia with VVS  - CCM - Daily weights, Strict I/Os - Daily BMP, Mag (K>4, Mg> 2) - Continue PT/OT  - Tylenol  650 mg q6hrs PRN - IR consulted for PICC placement given multiple failed graft sites previously

## 2024-09-12 NOTE — Plan of Care (Signed)
" °  Problem: Clinical Measurements: Goal: Ability to maintain clinical measurements within normal limits will improve Outcome: Progressing Goal: Will remain free from infection Outcome: Progressing Goal: Diagnostic test results will improve Outcome: Progressing Goal: Respiratory complications will improve Outcome: Progressing Goal: Cardiovascular complication will be avoided Outcome: Progressing   Problem: Clinical Measurements: Goal: Will remain free from infection Outcome: Progressing   Problem: Clinical Measurements: Goal: Respiratory complications will improve Outcome: Progressing   Problem: Clinical Measurements: Goal: Cardiovascular complication will be avoided Outcome: Progressing   Problem: Nutrition: Goal: Adequate nutrition will be maintained Outcome: Progressing   Problem: Coping: Goal: Level of anxiety will decrease Outcome: Progressing   "

## 2024-09-12 NOTE — Progress Notes (Signed)
" °   09/12/24 1605  Vitals  Temp 98.1 F (36.7 C)  Pulse Rate 62  Resp 14  BP (!) 158/88  SpO2 92 %  O2 Device Room Air  Weight 94.9 kg  Type of Weight Post-Dialysis  Oxygen Therapy  Patient Activity (if Appropriate) In bed  Pulse Oximetry Type Continuous  Oximetry Probe Site Changed No  Post Treatment  Dialyzer Clearance Lightly streaked  Hemodialysis Intake (mL) 0 mL  Liters Processed 72  Fluid Removed (mL) 2000 mL  Tolerated HD Treatment Yes   Received patient in bed to unit.  Alert and oriented.  Informed consent signed and in chart.   TX duration:3.0  Patient tolerated well.  Transported back to the room  Alert, without acute distress.  Hand-off given to patient's nurse.   Access used: [facility] Access issues: no complications  Total UF removed: 2000 Medication(s) given: none   [name] "

## 2024-09-12 NOTE — Progress Notes (Signed)
 "  Progress Note  Patient Name: [name] Date of Encounter: 09/12/2024  Primary Cardiologist: None   Subjective   Patient seen and examined at her bedside. Mother present in the room.   Inpatient Medications    Scheduled Meds:  sodium chloride    Intravenous Once   aspirin  EC  81 mg Oral Daily   atorvastatin   40 mg Oral Daily   Chlorhexidine  Gluconate Cloth  6 each Topical Q0600   Chlorhexidine  Gluconate Cloth  6 each Topical Q0600   cloNIDine   0.2 mg Oral TID   darbepoetin (ARANESP ) injection - DIALYSIS  100 mcg Subcutaneous Q Thu-1800   famotidine   10 mg Oral QHS   ferrous sulfate   325 mg Oral QODAY   lidocaine   1 patch Transdermal Q24H   metoprolol  tartrate  25 mg Oral BID   mycophenolate   720 mg Oral BID   nystatin  cream   Topical BID   polyethylene glycol  17 g Oral Daily   senna  1 tablet Oral Daily   tacrolimus   3 mg Oral BID   triamcinolone  cream  1 Application Topical TID   zolpidem   5 mg Oral QHS   Continuous Infusions:  heparin  1,700 Units/hr (09/12/24 0557)   PRN Meds: acetaminophen  **OR** [DISCONTINUED] acetaminophen , diphenhydrAMINE -zinc  acetate   Vital Signs    Vitals:   09/12/24 0239 09/12/24 0304 09/12/24 0900 09/12/24 1104  BP: (!) 131/111  (!) 141/83 126/84  Pulse: (!) 57 (!) 55 (!) 55 (!) 58  Resp: 15 15 14 13   Temp: 97.9 F (36.6 C)  (!) 97.5 F (36.4 C) 97.6 F (36.4 C)  TempSrc: Oral  Oral Oral  SpO2: 95% 91% 93% 93%  Weight:  88.2 kg    Height:        Intake/Output Summary (Last 24 hours) at 09/12/2024 1127 Last data filed at 09/12/2024 0557 Gross per 24 hour  Intake 728.69 ml  Output --  Net 728.69 ml   Filed Weights   09/10/24 0500 09/11/24 0446 09/12/24 0304  Weight: 97.5 kg 89.5 kg 88.2 kg    Telemetry    Sinus bradycardia HR in the 50s - Personally Reviewed  ECG     - Personally Reviewed  Physical Exam    General: Comfortable, Head: Atraumatic, normal size  Eyes: PEERLA, EOMI  Neck: Supple, normal  JVD Cardiac: Normal S1, S2; RRR; no murmurs, rubs, or gallops Lungs: Clear to auscultation bilaterally Abd: Soft, nontender, no hepatomegaly  Ext: warm, no edema Musculoskeletal: No deformities, BUE and BLE strength normal and equal Skin: Warm and dry, no rashes   Neuro: Alert and oriented to person, place, time, and situation, CNII-XII grossly intact, no focal deficits  Psych: Normal mood and affect   Labs    Chemistry Recent Labs  Lab 09/10/24 0440 09/11/24 0725 09/12/24 0219  NA 130* 134* 131*  K 4.3 4.4 4.9  CL 98 99 97*  CO2 18* 21* 19*  GLUCOSE 90 76 97  BUN 77* 53* 61*  CREATININE 5.47* 4.54* 5.12*  CALCIUM  7.6* 7.7* 7.9*  ALBUMIN 2.0* 2.0* 2.0*  GFRNONAA 9* 11* 10*  ANIONGAP 14 14 15      Hematology Recent Labs  Lab 09/10/24 1053 09/10/24 2131 09/11/24 0725 09/12/24 0219  WBC 7.6  --  7.9 10.2  RBC 2.92*  --  3.34* 3.45*  HGB 6.7* 8.3* 8.0* 8.2*  HCT 22.7* 27.6* 26.0* 27.3*  MCV 77.7*  --  77.8* 79.1*  MCH 22.9*  --  24.0*  23.8*  MCHC 29.5*  --  30.8 30.0  RDW 17.0*  --  16.8* 17.1*  PLT 291  --  316 287    Cardiac EnzymesNo results for input(s): TROPONINI in the last 168 hours. No results for input(s): TROPIPOC in the last 168 hours.   BNP Recent Labs  Lab 09/08/24 1929  PROBNP >35,000.0*     DDimer No results for input(s): DDIMER in the last 168 hours.   Radiology    US  EKG SITE RITE Result Date: 09/11/2024 If Site Rite image not attached, placement could not be confirmed due to current cardiac rhythm.  DG Chest Port 1 View Result Date: 09/10/2024 CLINICAL DATA:  Central line placement EXAM: PORTABLE CHEST 1 VIEW COMPARISON:  09/08/2024 FINDINGS: Single frontal view of the chest demonstrates left-sided internal jugular central venous catheter, tip overlying the atriocaval junction. Stable venous stents are seen within the SVC and left brachiocephalic vein. Stable enlargement of the cardiac silhouette. Continue veiling opacities at the  lung bases consistent with consolidation and effusions, right greater than left. No pneumothorax. No acute bony abnormalities. IMPRESSION: 1. No complication after left internal jugular dialysis catheter placement. 2. Stable enlarged cardiac silhouette and bibasilar consolidation/effusions. Electronically Signed   By: Ozell Daring M.D.   On: 09/10/2024 17:16   US  EKG SITE RITE Result Date: 09/10/2024 If Site Rite image not attached, placement could not be confirmed due to current cardiac rhythm.  ECHOCARDIOGRAM COMPLETE Result Date: 09/10/2024    ECHOCARDIOGRAM REPORT   Patient Name:   [name] Date of Exam: 09/10/2024 Medical Rec #:  [id number]           Height:       61.5 in Accession #:    [id number]          Weight:       214.9 lb Date of Birth:  29-Aug-1975          BSA:          1.959 m Patient Age:    48 years            BP:           131/85 mmHg Patient Gender: F                   HR:           75 bpm. Exam Location:  Inpatient Procedure: 2D Echo, Cardiac Doppler and Color Doppler (Both Spectral and Color            Flow Doppler were utilized during procedure). Indications:    Atrial Fibrillation I48.91  History:        Patient has prior history of Echocardiogram examinations, most                 recent 10/03/2022. Arrythmias:Atrial Fibrillation; Risk                 Factors:Hypertension, Dyslipidemia, GERD and Current Smoker.  Sonographer:    Koleen Popper RDCS Referring Phys: [id number] MEGAN L [facility]  Sonographer Comments: Suboptimal parasternal window. Image acquisition challenging due to respiratory motion. IMPRESSIONS  1. Left ventricular ejection fraction, by estimation, is 30 to 35%. The left ventricle has moderately decreased function. The left ventricle demonstrates regional wall motion abnormalities (see scoring diagram/findings for description). There is mild concentric left ventricular hypertrophy. Left ventricular diastolic parameters are consistent with Grade II diastolic  dysfunction (pseudonormalization). There is the interventricular septum is flattened in diastole ('D' shaped  left ventricle), consistent  with right ventricular volume overload.  2. Right ventricular systolic function is mildly reduced. The right ventricular size is moderately enlarged. There is moderately elevated pulmonary artery systolic pressure.  3. Left atrial size was mildly dilated.  4. Right atrial size was moderately dilated.  5. Moderate pleural effusion.  6. The mitral valve is normal in structure. Mild mitral valve regurgitation. No evidence of mitral stenosis.  7. Tricuspid valve regurgitation is moderate to severe.  8. The aortic valve is tricuspid. Aortic valve regurgitation is moderate. No aortic stenosis is present.  9. The inferior vena cava is dilated in size with <50% respiratory variability, suggesting right atrial pressure of 15 mmHg. Comparison(s): Changes from prior study are noted. New biventricular systolic dysfunction, mod-severe TR. FINDINGS  Left Ventricle: Left ventricular ejection fraction, by estimation, is 30 to 35%. The left ventricle has moderately decreased function. The left ventricle demonstrates regional wall motion abnormalities. The left ventricular internal cavity size was normal in size. There is mild concentric left ventricular hypertrophy. The interventricular septum is flattened in diastole ('D' shaped left ventricle), consistent with right ventricular volume overload. Left ventricular diastolic parameters are consistent with Grade II diastolic dysfunction (pseudonormalization).  LV Wall Scoring: The mid inferoseptal segment and basal inferoseptal segment are akinetic. The entire lateral wall and anterior septum are hypokinetic. Right Ventricle: The right ventricular size is moderately enlarged. No increase in right ventricular wall thickness. Right ventricular systolic function is mildly reduced. There is moderately elevated pulmonary artery systolic pressure. The  tricuspid regurgitant velocity is 3.06 m/s, and with an assumed right atrial pressure of 15 mmHg, the estimated right ventricular systolic pressure is 52.5 mmHg. Left Atrium: Left atrial size was mildly dilated. Right Atrium: Right atrial size was moderately dilated. Pericardium: There is no evidence of pericardial effusion. Mitral Valve: The mitral valve is normal in structure. Mild mitral valve regurgitation. No evidence of mitral valve stenosis. Tricuspid Valve: The tricuspid valve is normal in structure. Tricuspid valve regurgitation is moderate to severe. No evidence of tricuspid stenosis. Aortic Valve: The aortic valve is tricuspid. Aortic valve regurgitation is moderate. Aortic regurgitation PHT measures 107 msec. No aortic stenosis is present. Pulmonic Valve: The pulmonic valve was normal in structure. Pulmonic valve regurgitation is not visualized. No evidence of pulmonic stenosis. Aorta: The aortic root and ascending aorta are structurally normal, with no evidence of dilitation. Venous: The inferior vena cava is dilated in size with less than 50% respiratory variability, suggesting right atrial pressure of 15 mmHg. IAS/Shunts: No atrial level shunt detected by color flow Doppler. Additional Comments: There is a moderate pleural effusion.  LEFT VENTRICLE PLAX 2D LVIDd:         4.80 cm      Diastology LVIDs:         3.60 cm      LV e' medial:    2.87 cm/s LV PW:         1.30 cm      LV E/e' medial:  35.9 LV IVS:        1.20 cm      LV e' lateral:   7.77 cm/s LVOT diam:     2.00 cm      LV E/e' lateral: 13.3 LV SV:         57 LV SV Index:   29 LVOT Area:     3.14 cm  LV Volumes (MOD) LV vol d, MOD A2C: 122.0 ml LV vol d, MOD A4C: 148.0  ml LV vol s, MOD A2C: 74.1 ml LV vol s, MOD A4C: 88.3 ml LV SV MOD A2C:     47.9 ml LV SV MOD A4C:     148.0 ml LV SV MOD BP:      54.8 ml RIGHT VENTRICLE            IVC RV Basal diam:  5.30 cm    IVC diam: 2.80 cm RV Mid diam:    4.10 cm RV S prime:     8.38 cm/s TAPSE  (M-mode): 1.6 cm LEFT ATRIUM             Index        RIGHT ATRIUM           Index LA diam:        4.60 cm 2.35 cm/m   RA Area:     24.90 cm LA Vol (A2C):   82.0 ml 41.85 ml/m  RA Volume:   88.20 ml  45.01 ml/m LA Vol (A4C):   53.8 ml 27.46 ml/m LA Biplane Vol: 68.5 ml 34.96 ml/m  AORTIC VALVE LVOT Vmax:   103.00 cm/s LVOT Vmean:  65.000 cm/s LVOT VTI:    0.181 m AI PHT:      107 msec  AORTA Ao Root diam: 3.20 cm Ao Asc diam:  3.50 cm MITRAL VALVE                TRICUSPID VALVE MV Area (PHT): 4.39 cm     TR Peak grad:   37.5 mmHg MV Decel Time: 173 msec     TR Mean grad:   22.0 mmHg MV E velocity: 103.00 cm/s  TR Vmax:        306.00 cm/s MV A velocity: 57.10 cm/s   TR Vmean:       225.0 cm/s MV E/A ratio:  1.80                             SHUNTS                             Systemic VTI:  0.18 m                             Systemic Diam: 2.00 cm Georganna Archer Electronically signed by Georganna Archer Signature Date/Time: 09/10/2024/12:50:11 PM    Final     Cardiac Studies   Echo   Patient Profile     49 y.o. female with new onset atrial fibrillation  Assessment & Plan    New onset Atrial fibrillation  Anemia of Chronic disease  Acute volume overload: renal failure and heart failure Elevated Troponin, not ACS Hypertension  Coronary artery calcification  Clinically still in volume overloaded, this needs HD. Unfortunately today due to staffing shortage HD will be delayed till hopefully tomorrow.    With her new onset atrial fibrillation she has been started on heparin  drip, ok to transition to Eliquis  if there are no planned procedures.   She is on aspirin  81 mg daily, was taken off Plavix .  With this patient being discharged on anticoagulation and with her chronic anemia I am concerned about keeping her on aspirin  for discharge.  She has SVC stent placement it will be beneficial for vascular surgery to make a comment on the use of antiplatelet or not.  Recent  elevated troponin but flat  no need for any ischemic evaluation at this time.  Suspect this is in the setting of her renal failure no anginal symptoms.  Blood pressure acceptable will continue current regimen.     For questions or updates, please contact [facility] Please consult [email or web address] for contact info under Cardiology/STEMI.      Signed, Cruz Bong, DO  09/12/2024, 11:27 AM    "

## 2024-09-12 NOTE — Progress Notes (Signed)
 "    Daily Progress Note Intern Pager: [phone]  Patient name: [name] Medical record number: [id number] Date of birth: 31-Dec-1975 Age: 49 y.o. Gender: female  Primary Care Provider: Cristopher Suzen HERO, NP Consultants: Nephrology, Cardiology, VVS, IR  Code Status: DNR   Pt Overview and Major Events to Date:  1/28: admitted for new onset A-fib ISO of volume overload and ESRD with h/o renal transplant 1/29: TDC placed successfully by IR, HD started 1/30: CCM consulted for central line placement   Medical Decision Making: Patient is a 49 yo w PMHX of ESRD s/p L kidney transplant 2010, chronic glomerulonephritis, HFpEF, SVC syndrome s/p successful recanalization complicated by SVC tear and cardiac tamponade 2018, admitted for CHF exacerbation and new onset A fib ISO volume overload. Nephrology and cardiology following.  Assessment & Plan Atrial fibrillation, new onset (HCC) Volume overload Acute combined systolic and diastolic heart failure (HCC) Rates overnight 50s-60s overnight. Reports some exertional dyspnea.  - Cardiology following, appreciate recs  - continue diuresis in dialysis   - continue lopressor  25 mg BID - consider consolidation to succinate prior to dc   - continue IV heparin , will need to transition to Eliquis  prior to d/c   - transfusion threshold < 7  - will plan to discuss antiplatelet use for SVC stent and concurrent anemia with VVS  - CCM - Daily weights, Strict I/Os - Daily BMP, Mag (K>4, Mg> 2) - Continue PT/OT  - Tylenol  650 mg q6hrs PRN - IR consulted for PICC placement given multiple failed graft sites previously   Kidney transplant failure IDA (iron  deficiency anemia) Chronic renal failure, stage 5 (HCC) New ESRD after kidney failure w evidence of uremia and volume overload. Patient now s/p [facility] 1/29 and receiving dialysis.  Hg appears stable at 8.2 overnight. Patient is s/p 1U pRBC on 1/30.  - Nephrology following, appreciate recs   -  continue HD for UF and electrolyte abnormalities   - continue aranesp  100 mcg weekly   - continue ferrous sulfate  325 every other day  - Daily RFP, plan to replete electrolytes prn - Continue home prograf  (3 mg BID) and myfortic  (720 mg BID)  - Labs pending: PTH, lipoprotein A  Contact dermatitis Will continue triamcinolone  cream 0.1% to affected areas prn   Chronic health problem CAD/SVC syndrome s/p stent - continue home ASA 81mg  daily; holding home Plavix  75 mg daily, will plan to discuss with VVS prior to d/c  HTN - appears stable, continue home clonidine  0.2mg  TID, per fill history, patient was taking also taking torsemide  20mg  daily at home (although based on fill history, not very adherent), not taking losartan or lasix - will plan to hold torsemide  pending cards recs  HLD - change crestor  20mg  to Lipitor 40mg  for renal clearance GERD - continue decreased famotidine  10 mg for renal clearance Constipation - continue Miralax  and Senna daily, increase to BID PRN  FEN/GI:  NPO pending PICC placement with IR - plan to continue renal diet with 1200 mL fluid restriction after  PPx: IV heparin   Dispo: home pending clinical improvement   Subjective:  Patient reports some exertional dyspnea when getting out of bed but no other complaints. Alert and oriented   Objective: Temp:  [97.6 F (36.4 C)-97.9 F (36.6 C)] 97.9 F (36.6 C) (02/01 0239) Pulse Rate:  [54-62] 55 (02/01 0304) Resp:  [15-20] 15 (02/01 0304) BP: (120-149)/(87-111) 131/111 (02/01 0239) SpO2:  [91 %-100 %] 91 % (02/01 0304) Weight:  [88.2 kg]  88.2 kg (02/01 0304) Physical Exam: General: chronically ill appearing, A&O x4, NAD Cardiovascular: RRR, no m/r/g Respiratory: NWOB on RA  Abdomen: obese, soft, NTND  Extremities: 1+ edema bilaterally, no pitting edema noted   Laboratory: Most recent CBC Lab Results  Component Value Date   WBC 10.2 09/12/2024   HGB 8.2 (L) 09/12/2024   HCT 27.3 (L) 09/12/2024   MCV 79.1  (L) 09/12/2024   PLT 287 09/12/2024   Most recent BMP    Latest Ref Rng & Units 09/12/2024    2:19 AM  BMP  Glucose 70 - 99 mg/dL 97   BUN 6 - 20 mg/dL 61   Creatinine 9.55 - 1.00 mg/dL 4.87   Sodium 864 - 854 mmol/L 131   Potassium 3.5 - 5.1 mmol/L 4.9   Chloride 98 - 111 mmol/L 97   CO2 22 - 32 mmol/L 19   Calcium  8.9 - 10.3 mg/dL 7.9      Imaging/Diagnostic Tests: No new images  Lonnie Sawsan Riggio, MD 09/12/2024, 8:10 AM  PGY-2, [facility] FPTS Intern pager: [phone], text pages welcome Secure chat group [facility] Teaching Service   "

## 2024-09-12 NOTE — Assessment & Plan Note (Addendum)
 CAD/SVC syndrome s/p stent - continue home ASA 81mg  daily; holding home Plavix  75 mg daily, will plan to discuss with VVS prior to d/c  HTN - appears stable, continue home clonidine  0.2mg  TID, per fill history, patient was taking also taking torsemide  20mg  daily at home (although based on fill history, not very adherent), not taking losartan or lasix - will plan to hold torsemide  pending cards recs  HLD - change crestor  20mg  to Lipitor 40mg  for renal clearance GERD - continue decreased famotidine  10 mg for renal clearance Constipation - continue Miralax  and Senna daily, increase to BID PRN

## 2024-09-12 NOTE — Progress Notes (Signed)
 Pt received from HD, she is alert, denies any pain/discomfort, V/S obtained, all needs met, call bell in reach.    09/12/24 1703  Vitals  Temp 97.7 F (36.5 C)  Temp Source Oral  BP (!) 166/89  MAP (mmHg) 111  BP Location Left Arm  BP Method Automatic  Patient Position (if appropriate) Lying  Pulse Rate 65  Pulse Rate Source Monitor  ECG Heart Rate 64  Resp 15  Level of Consciousness  Level of Consciousness Alert  MEWS COLOR  MEWS Score Color Green  Oxygen Therapy  SpO2 95 %  O2 Device Room Air  MEWS Score  MEWS Temp 0  MEWS Systolic 0  MEWS Pulse 0  MEWS RR 0  MEWS LOC 0  MEWS Score 0

## 2024-09-12 NOTE — Plan of Care (Signed)
 FMTS Interim Progress Note  S: Notified by RN that patient's right foot was very painful and swollen.  Went to assess patient, and she stated that she began experiencing pain in her right foot about an hour ago; it also became more swollen at this time.  Pain feels like a squeezing sensation, patient had difficulty describing.  No pain in upper right leg or calf.  O: BP (!) 147/92 (BP Location: Left Arm)   Pulse 60   Temp (!) 97.5 F (36.4 C) (Oral)   Resp 16   Ht 5' 1.5 (1.562 m)   Wt 94.9 kg   SpO2 95%   BMI 38.89 kg/m   Extremity: Right foot with mild to moderate edema of dorsal surface.  No significant erythema.  Tenderness to light palpation of dorsal and medial aspect of foot.  No tenderness to palpation of right calf, shin.  2+ DP pulses bilaterally.  Patient able to wiggle toes, move ankle, move right leg.  A/P: Right Foot Pain Unclear source of right foot pain.  Patient has been on anticoagulation, though this did switch from IV heparin  to p.o. Eliquis  earlier today, there was no gaps in anticoagulation coverage.  However, given edema and sudden onset of pain, is concern for potential clot and will evaluate.  Low concern for cellulitis given no significant erythema.  Consider neuropathic pain given tenderness to very minimal palpation. - Stat DVT US  right leg ordered - If clot present, will increase Eliquis  to 10 mg twice daily - If no clot, can give lidocaine  gel for foot pain  Larraine Palma, MD 09/12/2024, 9:06 PM PGY-1, [facility] Family [facility] Service pager [phone]

## 2024-09-13 ENCOUNTER — Inpatient Hospital Stay (HOSPITAL_COMMUNITY)

## 2024-09-13 ENCOUNTER — Encounter (HOSPITAL_COMMUNITY): Payer: Self-pay | Admitting: Family Medicine

## 2024-09-13 DIAGNOSIS — E8779 Other fluid overload: Secondary | ICD-10-CM

## 2024-09-13 DIAGNOSIS — M7989 Other specified soft tissue disorders: Secondary | ICD-10-CM

## 2024-09-13 DIAGNOSIS — M79671 Pain in right foot: Secondary | ICD-10-CM

## 2024-09-13 DIAGNOSIS — I161 Hypertensive emergency: Secondary | ICD-10-CM

## 2024-09-13 DIAGNOSIS — R410 Disorientation, unspecified: Secondary | ICD-10-CM | POA: Insufficient documentation

## 2024-09-13 LAB — BLOOD GAS, VENOUS
Acid-Base Excess: 1 mmol/L (ref 0.0–2.0)
Bicarbonate: 26.6 mmol/L (ref 20.0–28.0)
Drawn by: 73734
O2 Saturation: 82.3 %
Patient temperature: 36.3
pCO2, Ven: 44 mmHg (ref 44–60)
pH, Ven: 7.39 (ref 7.25–7.43)
pO2, Ven: 44 mmHg (ref 32–45)

## 2024-09-13 LAB — CBC
HCT: 26.6 % — ABNORMAL LOW (ref 36.0–46.0)
Hemoglobin: 7.9 g/dL — ABNORMAL LOW (ref 12.0–15.0)
MCH: 24.2 pg — ABNORMAL LOW (ref 26.0–34.0)
MCHC: 29.7 g/dL — ABNORMAL LOW (ref 30.0–36.0)
MCV: 81.3 fL (ref 80.0–100.0)
Platelets: 289 10*3/uL (ref 150–400)
RBC: 3.27 MIL/uL — ABNORMAL LOW (ref 3.87–5.11)
RDW: 17.5 % — ABNORMAL HIGH (ref 11.5–15.5)
WBC: 9.5 10*3/uL (ref 4.0–10.5)
nRBC: 0 % (ref 0.0–0.2)

## 2024-09-13 LAB — GLUCOSE, CAPILLARY: Glucose-Capillary: 97 mg/dL (ref 70–99)

## 2024-09-13 LAB — RENAL FUNCTION PANEL
Albumin: 2.2 g/dL — ABNORMAL LOW (ref 3.5–5.0)
Anion gap: 15 (ref 5–15)
BUN: 39 mg/dL — ABNORMAL HIGH (ref 6–20)
CO2: 23 mmol/L (ref 22–32)
Calcium: 7.6 mg/dL — ABNORMAL LOW (ref 8.9–10.3)
Chloride: 94 mmol/L — ABNORMAL LOW (ref 98–111)
Creatinine, Ser: 3.94 mg/dL — ABNORMAL HIGH (ref 0.44–1.00)
GFR, Estimated: 14 mL/min — ABNORMAL LOW
Glucose, Bld: 105 mg/dL — ABNORMAL HIGH (ref 70–99)
Phosphorus: 4 mg/dL (ref 2.5–4.6)
Potassium: 3.9 mmol/L (ref 3.5–5.1)
Sodium: 132 mmol/L — ABNORMAL LOW (ref 135–145)

## 2024-09-13 LAB — HEPATITIS B SURFACE ANTIBODY, QUANTITATIVE: Hep B S AB Quant (Post): <3.5 m[IU]/mL — ABNORMAL LOW

## 2024-09-13 LAB — HEMOGLOBIN A1C
Hgb A1c MFr Bld: 5.4 % (ref 4.8–5.6)
Mean Plasma Glucose: 108.28 mg/dL

## 2024-09-13 LAB — MAGNESIUM: Magnesium: 2 mg/dL (ref 1.7–2.4)

## 2024-09-13 MED ORDER — PREDNISONE 20 MG PO TABS: 50.0000 mg | ORAL_TABLET | Freq: Every day | ORAL | Status: DC

## 2024-09-13 MED ORDER — MYCOPHENOLATE SODIUM 180 MG PO TBEC
360.0000 mg | DELAYED_RELEASE_TABLET | Freq: Two times a day (BID) | ORAL | Status: AC
  Administered 2024-09-13 – 2024-09-17 (×8): 360 mg via ORAL
  Filled 2024-09-13 (×9): qty 2

## 2024-09-13 MED ORDER — POTASSIUM CHLORIDE CRYS ER 20 MEQ PO TBCR
40.0000 meq | EXTENDED_RELEASE_TABLET | Freq: Once | ORAL | Status: AC
  Administered 2024-09-13: 40 meq via ORAL
  Filled 2024-09-13: qty 2

## 2024-09-13 MED ORDER — METOPROLOL SUCCINATE ER 50 MG PO TB24
50.0000 mg | ORAL_TABLET | Freq: Every day | ORAL | Status: DC
  Administered 2024-09-15 – 2024-09-16 (×2): 50 mg via ORAL
  Filled 2024-09-13 (×3): qty 1

## 2024-09-13 MED ORDER — POTASSIUM CHLORIDE 20 MEQ PO PACK: 40.0000 meq | PACK | Freq: Once | ORAL | Status: DC

## 2024-09-13 MED ORDER — PREDNISONE 20 MG PO TABS
50.0000 mg | ORAL_TABLET | Freq: Every day | ORAL | Status: AC
  Administered 2024-09-13 – 2024-09-17 (×5): 50 mg via ORAL
  Filled 2024-09-13 (×5): qty 1

## 2024-09-13 MED ORDER — CEPHALEXIN 500 MG PO CAPS
500.0000 mg | ORAL_CAPSULE | Freq: Two times a day (BID) | ORAL | Status: DC
  Administered 2024-09-13: 500 mg via ORAL
  Filled 2024-09-13: qty 1

## 2024-09-13 MED ORDER — SALINE SPRAY 0.65 % NA SOLN
1.0000 | NASAL | Status: DC | PRN
  Filled 2024-09-13: qty 44

## 2024-09-13 NOTE — Assessment & Plan Note (Addendum)
 Presented with uremia and volume overload.  New ESRD on HD after kidney transplant failure.   S/p [facility] 1/29.  HD yesterday, removed 2 L. Hemoglobin remains stable at 7.9 this a.m. S/p 1U PRBC on 1/30 Nephrology following, recommendations as below:  - Continue HD with UF  - continue aranesp  100 mcg weekly   - continue ferrous sulfate  325 every other day  - Daily RFP, plan to replete electrolytes prn - Continue home prograf  (3 mg BID) and myfortic  (720 mg BID)  - Labs pending: PTH, lipoprotein A  - Fluid restriction -Of note, patient had slightly elevated beta-hCG, this is thought to be secondary to renal failure.  Patient is monogamous with female partner pelvic ultrasound 1/29 negative.  Downtrended from 27 to 20.

## 2024-09-13 NOTE — Progress Notes (Signed)
 "    Daily Progress Note Intern Pager: [phone]  Patient name: [name] Medical record number: [id number] Date of birth: 1976-06-18 Age: 49 y.o. Gender: female  Primary Care Provider: Cristopher Suzen HERO, NP Consultants: Nephrology, cardiology, VVS, IR Code Status: DNR  Pt Overview and Major Events to Date:  1/28: admitted for new onset A-fib ISO of volume overload and ESRD with h/o renal transplant 1/29: TDC placed successfully by IR, HD started 1/30: CCM consulted for central line placement   Medical Decision Making: [name] is a 49 year old female with history of ESRD s/p L kidney transplant in 2010, chronic glomerulonephritis, HFpEF, SVC syndrome s/p successful recanalization complicated by SVC tear and cardiac tamponade in 2018 admitted for CHF exacerbation and new onset A-fib ISO volume overload. Stable, appreciate ongoing cardiology and nephrology recommendations. Confusion this AM Assessment & Plan Atrial fibrillation, new onset (HCC) Volume overload Acute combined systolic and diastolic heart failure (HCC) Heart rate remains in the high 50s to 60s.   Cardiology following, recommendation as below:  -Diuresis via dialysis -On Eliquis  5 mg twice daily currently, transitioned from heparin  2/1  -Believe recent elevated troponin in setting of renal failure given no anginal symptoms  - Continue blood pressure regimen (clonidine  0.2 TID, metoprolol  25 BID)  - Transfusion threshold less than 7 VVS recommendations  -Patient does not need ASA and Plavix  at discharge if on Eliquis  - CCM - Daily weights, Strict I/Os - Daily BMP, Mag (K>4, Mg> 2) - K supplement today - Continue PT/OT  - Tylenol  650 mg q6hrs PRN - Holding off on PICC line for now, access team attempted 1/31 but unable due to concerns around location of placement  Kidney transplant failure IDA (iron  deficiency anemia) Chronic renal failure, stage 5 (HCC) Presented with uremia and volume  overload.  New ESRD on HD after kidney transplant failure.   S/p [facility] 1/29.  HD yesterday, removed 2 L. Hemoglobin remains stable at 7.9 this a.m. S/p 1U PRBC on 1/30 Nephrology following, recommendations as below:  - Continue HD with UF  - continue aranesp  100 mcg weekly   - continue ferrous sulfate  325 every other day  - Daily RFP, plan to replete electrolytes prn - Continue home prograf  (3 mg BID) and myfortic  (720 mg BID)  - Labs pending: PTH, lipoprotein A  - Fluid restriction -Of note, patient had slightly elevated beta-hCG, this is thought to be secondary to renal failure.  Patient is monogamous with female partner pelvic ultrasound 1/29 negative.  Downtrended from 27 to 20. Confusion with non-focal neuro exam Although patient is on Eliquis , unlikely acute intracranial process given lack of head trauma, nonfocal neuro exam. Differential includes sequelae of volume overload, sleep deprivation,  - POC glucose - VBG - May need additional dialysis session today- reach out to nephro Right foot pain Reported pain and edema in the evening 2/1.  Exam at that time with normal pulses, no symptoms in right calf or thigh.  No significant gap in anticoagulation and VSS. DVT US  negative. This a.m., exam significant for worsening venous stasis dermatitis versus cellulitis on RLE.  Patient afebrile with VSS as above.  Will initiate treatment for cellulitis. MRSA coverage not indicated. -Keflex  500mg  q12h x 5 days Contact dermatitis Benadryl  cream to affected areas PRN Chronic health problem CAD/SVC syndrome s/p stent -no DAPT needed while on DOAC per VVS as above HTN - stable, continue home clonidine  0.2mg  TID and metoprolol  25 mg twice daily HLD - change crestor   20mg  to Lipitor 40mg  for renal clearance GERD - famotidine  10 mg (renal dosing) Constipation - continue Miralax  and Senna daily   FEN/GI: Renal with fluid restriction PPx: On Eliquis  Dispo: Pending clinical  improvement  Subjective:  Patient states she feels off and on reports she has been confused this morning.  RN initially called rapid response for systolic blood pressure reading of 50 however normal blood pressure when manually measured on other arm (130s/60s).  Glucose normal on a.m. lab.  Telemetry appropriate. VSS, AAO x 3 with normal neuro exam.  No falls overnight.  Patient denies chest pain, shortness of breath, abdominal pain, fever/chills, pain elsewhere. Had reported right foot pain overnight, on assessment this morning RLE appears red, swollen, and right foot is tender to the touch.   Objective: Temp:  [97.5 F (36.4 C)-98.3 F (36.8 C)] 97.5 F (36.4 C) (02/02 0354) Pulse Rate:  [55-65] 58 (02/01 2307) Resp:  [13-19] 18 (02/01 2307) BP: (104-170)/(72-99) 104/86 (02/02 0354) SpO2:  [90 %-100 %] 100 % (02/01 2307) Weight:  [86.8 kg-96.9 kg] 86.8 kg (02/02 0529) Physical Exam: General: Laying down in bed, awake and interactive with exam but intermittently drowsy. Cardiovascular: RRR, normal S1/S2, no M/R/G Respiratory: CTAB to anterior lung fields, normal WB on RA, no W/R/R Abdomen: Normoactive bowel sounds, soft, nontender, nondistended Extremities: BLE with evidence of chronic venous stasis dermatitis. LLE with Mepilex dressings on distal calf, some unroofed blisters but no signs of active infection. RLE with erythema, warmth, edema, significant TTP particularly at right dorsal foot and blistering.  No streaking.  Laboratory: Most recent CBC Lab Results  Component Value Date   WBC 9.5 09/13/2024   HGB 7.9 (L) 09/13/2024   HCT 26.6 (L) 09/13/2024   MCV 81.3 09/13/2024   PLT 289 09/13/2024   Most recent BMP    Latest Ref Rng & Units 09/13/2024    1:55 AM  BMP  Glucose 70 - 99 mg/dL 894   BUN 6 - 20 mg/dL 39   Creatinine 9.55 - 1.00 mg/dL 6.05   Sodium 864 - 854 mmol/L 132   Potassium 3.5 - 5.1 mmol/L 3.9   Chloride 98 - 111 mmol/L 94   CO2 22 - 32 mmol/L 23    Calcium  8.9 - 10.3 mg/dL 7.6      Imaging/Diagnostic Tests: 2/2 DVT ultrasound prelim report negative  Adele Song, MD 09/13/2024, 8:17 AM  PGY-2, [facility] FPTS Intern pager: [phone], text pages welcome Secure chat group [facility] Teaching Service   "

## 2024-09-13 NOTE — Assessment & Plan Note (Addendum)
 CAD/SVC syndrome s/p stent -no DAPT needed while on DOAC per VVS as above HTN - stable, continue home clonidine  0.2mg  TID and metoprolol  25 mg twice daily HLD - change crestor  20mg  to Lipitor 40mg  for renal clearance GERD - famotidine  10 mg (renal dosing) Constipation - continue Miralax  and Senna daily

## 2024-09-13 NOTE — Plan of Care (Signed)
" °  Problem: Education: Goal: Knowledge of General Education information will improve Description: Including pain rating scale, medication(s)/side effects and non-pharmacologic comfort measures Outcome: Progressing   Problem: Clinical Measurements: Goal: Ability to maintain clinical measurements within normal limits will improve Outcome: Progressing Goal: Will remain free from infection Outcome: Progressing Goal: Diagnostic test results will improve Outcome: Progressing Goal: Respiratory complications will improve Outcome: Progressing Goal: Cardiovascular complication will be avoided Outcome: Progressing   Problem: Activity: Goal: Risk for activity intolerance will decrease Outcome: Progressing   Problem: Nutrition: Goal: Adequate nutrition will be maintained Outcome: Progressing   Problem: Coping: Goal: Level of anxiety will decrease Outcome: Progressing   Problem: Elimination: Goal: Will not experience complications related to bowel motility Outcome: Progressing Goal: Will not experience complications related to urinary retention Outcome: Progressing   Problem: Pain Managment: Goal: General experience of comfort will improve and/or be controlled Outcome: Progressing   Problem: Safety: Goal: Ability to remain free from injury will improve Outcome: Progressing   Problem: Skin Integrity: Goal: Risk for impaired skin integrity will decrease Outcome: Progressing   Problem: Fluid Volume: Goal: Compliance with measures to maintain balanced fluid volume will improve Outcome: Progressing   Problem: Nutritional: Goal: Ability to make healthy dietary choices will improve Outcome: Progressing   Problem: Clinical Measurements: Goal: Complications related to the disease process, condition or treatment will be avoided or minimized Outcome: Progressing   "

## 2024-09-13 NOTE — Progress Notes (Signed)
 Physical Therapy Treatment Patient Details Name: [name] MRN: [id number] DOB: April 29, 1976 Today's Date: 09/13/2024   History of Present Illness Pt is a 49 y.o. female who presented 09/08/24 with SOB and generalized peripheral edema. Pt admitted with volume overload and new A-fib with RVR. PMH: end-stage kidney disease s/p kidney transplant in 2010, HTN, SVC syndrome, HLD, anemia    PT Comments  Pt resting in bed with spouse present at bedside, agreeable to attempt mobility despite continued R foot pain which is suspected to be gout related. Sits to EOB at [facility], stands with RW and takes steps to recliner at MOD A. Continued cues for safety and sequencing of steps to recliner due to pain an impaired insight to deficits and safety awareness. Pt continues to report hallucinations throughout the day and night. Per spouse, MD just called and pt will have a head CT completed soon for further assessment. Pt will continue to benefit from therapy while admitted to acute setting. Pending progress and pain management of R foot, pt will benefit from follow up therapy with HHPT.     If plan is discharge home, recommend the following: A little help with walking and/or transfers;A little help with bathing/dressing/bathroom;Assistance with cooking/housework;Assist for transportation;Help with stairs or ramp for entrance   Can travel by private vehicle        Equipment Recommendations  BSC/3in1;Rollator (4 wheels)    Recommendations for Other Services       Precautions / Restrictions Precautions Precautions: Fall;Other (comment) Precaution/Restrictions Comments: watch SpO2 Restrictions Weight Bearing Restrictions Per Provider Order: No     Mobility  Bed Mobility Overal bed mobility: Needs Assistance Bed Mobility: Supine to Sit     Supine to sit: Contact guard     General bed mobility comments: increased time and effort to sit up to EOB, no reports of dizziness or lightheadedness once  seated. Mild increase in R foot pain in dependent position    Transfers Overall transfer level: Needs assistance Equipment used: Rolling walker (2 wheels) Transfers: Sit to/from Stand, Bed to chair/wheelchair/BSC Sit to Stand: Mod assist   Step pivot transfers: Mod assist       General transfer comment: MOD A to stand from EOB with VC for proper hand placement, posture, and gluteal recruitment to extend hip. Takes steps to turn and sit in recliner, continual cues for sequencing, support through BUE on RW to offset weight on R foot, proximity to RW and backing up completely to recliner before reaching for chair. pt frequently tried to lean RW behind, poor insight for safety    Ambulation/Gait Ambulation/Gait assistance: Contact guard assist, Min assist Gait Distance (Feet): 5 Feet (steps to move to recliner) Assistive device: Rolling walker (2 wheels)   Gait velocity: reduced     General Gait Details: forward flexed posture, intermittent R knee flexion when putting weight onto R foot however knee did not buckle, VC for support on RW to help with pain management. Limited tolerance to activity due to R foot gout pain   Stairs             Wheelchair Mobility     Tilt Bed    Modified Rankin (Stroke Patients Only)       Balance Overall balance assessment: Needs assistance Sitting-balance support: Feet supported, Bilateral upper extremity supported, Single extremity supported Sitting balance-Leahy Scale: Fair Sitting balance - Comments: supervision for safety   Standing balance support: Bilateral upper extremity supported, During functional activity, Reliant on assistive device for  balance Standing balance-Leahy Scale: Poor Standing balance comment: reliant on RW                            Communication Communication Communication: No apparent difficulties  Cognition Arousal: Alert, Lethargic Behavior During Therapy: Flat affect   PT - Cognitive  impairments: No apparent impairments, Safety/Judgement                       PT - Cognition Comments: continues to have flat affect, reports continued hallucinations that occur any time of day. Some impulsivity during mobility tasks, repeated cues for safety and to wait for PT Following commands: Intact      Cueing Cueing Techniques: Verbal cues  Exercises      General Comments        Pertinent Vitals/Pain Pain Assessment Pain Assessment: Faces Faces Pain Scale: Hurts whole lot Pain Location: R foot d/t gout Pain Descriptors / Indicators: Discomfort, Grimacing, Guarding, Sharp Pain Intervention(s): Monitored during session, Premedicated before session, Repositioned, Limited activity within patient's tolerance    Home Living                          Prior Function            PT Goals (current goals can now be found in the care plan section) Acute Rehab PT Goals Patient Stated Goal: to feel better PT Goal Formulation: With patient Time For Goal Achievement: 09/23/24 Potential to Achieve Goals: Good Progress towards PT goals: Progressing toward goals    Frequency    Min 2X/week      PT Plan      Co-evaluation              AM-PAC PT 6 Clicks Mobility   Outcome Measure  Help needed turning from your back to your side while in a flat bed without using bedrails?: A Little Help needed moving from lying on your back to sitting on the side of a flat bed without using bedrails?: A Little Help needed moving to and from a bed to a chair (including a wheelchair)?: A Lot Help needed standing up from a chair using your arms (e.g., wheelchair or bedside chair)?: A Lot Help needed to walk in hospital room?: A Lot Help needed climbing 3-5 steps with a railing? : A Lot 6 Click Score: 14    End of Session Equipment Utilized During Treatment: Gait belt Activity Tolerance: Patient limited by fatigue;Patient limited by pain Patient left: with call  bell/phone within reach;in chair;with chair alarm set;with family/visitor present Nurse Communication: Mobility status PT Visit Diagnosis: Unsteadiness on feet (R26.81);Other abnormalities of gait and mobility (R26.89);Muscle weakness (generalized) (M62.81);Difficulty in walking, not elsewhere classified (R26.2)     Time: 8472-8449 PT Time Calculation (min) (ACUTE ONLY): 23 min  Charges:    $Therapeutic Activity: 23-37 mins                       Isaiah DEL. Valarie Farace, PT, DPT   [employer] 09/13/2024, 3:57 PM

## 2024-09-13 NOTE — Progress Notes (Signed)
 Pt has been accepted at TCU at [facility] on Mon, Tues, Thurs, Fri 1:00 pm chair time. Pt can start on Thursday and will need to arrive at 12:15 pm to complete paperwork prior to first treatment. Update provided to nephrologist. Navigator working remote today due to weather. Unable to meet with pt today and pt voicing concerns to staff today about feeling confused. Conversation via phone will likely prove difficult today due to above. Will attempt to speak/meet with pt tomorrow. Will assist as needed.   Randine Mungo Dialysis Navigator [phone]

## 2024-09-13 NOTE — Assessment & Plan Note (Addendum)
 Reported pain and edema in the evening 2/1.  Exam at that time with normal pulses, no symptoms in right calf or thigh.  No significant gap in anticoagulation and VSS. DVT US  negative. This a.m., exam significant for worsening venous stasis dermatitis versus cellulitis on RLE.  Patient afebrile with VSS as above.  Will initiate treatment for cellulitis. MRSA coverage not indicated. -Keflex  500mg  q12h x 5 days

## 2024-09-13 NOTE — Progress Notes (Signed)
 OT Cancellation Note  Patient Details Name: [name] MRN: [id number] DOB: [birth year]   Cancelled Treatment:    Reason Eval/Treat Not Completed: Other (comment) (Spoke to nursing still pending US  of RLE. Will continue to follow.)  Warrick POUR OTR/L  Acute Rehab Services  [phone] office number   Warrick Berber 09/13/2024, 7:39 AM

## 2024-09-13 NOTE — TOC Initial Note (Signed)
 Transition of Care (TOC) - Initial/Assessment Note  Rayfield Gobble RN, BSN Inpatient Care Management Unit 4E- RN Case Manager See Treatment Team for direct phone #   Patient Details  Name: [name] MRN: [id number] Date of Birth: Sep 06, 1975  Transition of [facility]) CM/SW Contact:    Gobble Rayfield Hurst, RN Phone Number: 09/13/2024, 2:52 PM  Clinical Narrative:                 Pt from home w/ SO, admitted with Vol. Overload and new afib. Now on HD- outpt HD has been arranged per renal navigator note for TTS spot- note pt may need HH/DME, per MD note pt feels confused today and lethargic w/ OT.  ICM working off site today due to winter weather conditions, Will plan to follow up with pt in person tomorrow for discharge needs.   Expected Discharge Plan: Home w Home Health Services Barriers to Discharge: Continued Medical Work up   Patient Goals and CMS Choice Patient states their goals for this hospitalization and ongoing recovery are:: return home   Choice offered to / list presented to : Patient      Expected Discharge Plan and Services   Discharge Planning Services: CM Consult Post Acute Care Choice: Durable Medical Equipment, Home Health Living arrangements for the past 2 months: Single Family Home                                      Prior Living Arrangements/Services Living arrangements for the past 2 months: Single Family Home Lives with:: Significant Other Patient language and need for interpreter reviewed:: Yes              Criminal Activity/Legal Involvement Pertinent to Current Situation/Hospitalization: No - Comment as needed  Activities of Daily Living   ADL Screening (condition at time of admission) Independently performs ADLs?: Yes (appropriate for developmental age) Is the patient deaf or have difficulty hearing?: No Does the patient have difficulty seeing, even when wearing glasses/contacts?: No Does the patient have difficulty  concentrating, remembering, or making decisions?: No  Permission Sought/Granted                  Emotional Assessment         Alcohol / Substance Use: Not Applicable Psych Involvement: No (comment)  Admission diagnosis:  Atrial fibrillation (HCC) [I48.91] History of kidney transplant [Z94.0] Hypertensive emergency [I16.1] Chronic renal failure, stage 5 (HCC) [N18.5] Acute congestive heart failure, unspecified heart failure type (HCC) [I50.9] Acute hypoxic respiratory failure (HCC) [J96.01] Patient Active Problem List   Diagnosis Date Noted   Right foot pain 09/13/2024   Confusion with non-focal neuro exam 09/13/2024   Hypertensive emergency 09/13/2024   ESRD (end stage renal disease) (HCC) 09/12/2024   Chronic renal failure, stage 5 (HCC) 09/11/2024   Contact dermatitis 09/10/2024   Constipation 09/10/2024   Acute HFrEF (heart failure with reduced ejection fraction) (HCC) 09/10/2024   Coronary artery calcification seen on CT scan 09/10/2024   History of kidney transplant 09/10/2024   Acute congestive heart failure (HCC) 09/10/2024   Pressure injury of skin 09/10/2024   IDA (iron  deficiency anemia) 09/09/2024   Acute hypoxic respiratory failure (HCC) 09/09/2024   Chronic health problem 09/08/2024   PAF (paroxysmal atrial fibrillation) (HCC) 09/08/2024   Volume overload 09/08/2024   Kidney transplant failure 09/08/2024   Anemia of chronic renal failure 06/22/2024  Abdominal pain 05/09/2020   Acute pancreatitis 05/09/2020   Pancreatitis 05/08/2020   SVC syndrome 03/17/2014   METHICILLIN RESISTANT STAPHYLOCOCCUS AUREUS 11/20/2009   METHICILLIN RESISTANT STAPH AUREUS SEPTICEMIA 10/30/2009   METHICILLIN SUSCEPTIBLE STAPH INF CCE & UNS SITE 10/30/2009   Essential hypertension 10/30/2009   ABSCESS, FACE 10/30/2009   BACTEREMIA 10/30/2009   KIDNEY TRANSPLANTATION, HX OF 10/30/2009   PCP:  Cristopher Suzen HERO, NP Pharmacy:   [id number] - [place], [place] - [address]  [address] [place] [address] Phone: [phone] Fax: [phone]  [place] [facility] [address] [place] [address] Phone: [phone] Fax: [phone]     Social Drivers of Health (SDOH) Social History: SDOH Screenings   Food Insecurity: No Food Insecurity (09/09/2024)  Housing: Low Risk (09/09/2024)  Transportation Needs: No Transportation Needs (09/09/2024)  Utilities: Not At Risk (09/09/2024)  Tobacco Use: High Risk (09/13/2024)   SDOH Interventions:     Readmission Risk Interventions     No data to display

## 2024-09-13 NOTE — Assessment & Plan Note (Addendum)
 Benadryl  cream to affected areas PRN

## 2024-09-14 DIAGNOSIS — E8779 Other fluid overload: Secondary | ICD-10-CM | POA: Diagnosis not present

## 2024-09-14 LAB — RENAL FUNCTION PANEL
Albumin: 2.4 g/dL — ABNORMAL LOW (ref 3.5–5.0)
Albumin: 2.5 g/dL — ABNORMAL LOW (ref 3.5–5.0)
Anion gap: 14 (ref 5–15)
Anion gap: 14 (ref 5–15)
BUN: 52 mg/dL — ABNORMAL HIGH (ref 6–20)
BUN: 60 mg/dL — ABNORMAL HIGH (ref 6–20)
CO2: 22 mmol/L (ref 22–32)
CO2: 22 mmol/L (ref 22–32)
Calcium: 8.4 mg/dL — ABNORMAL LOW (ref 8.9–10.3)
Calcium: 8.7 mg/dL — ABNORMAL LOW (ref 8.9–10.3)
Chloride: 95 mmol/L — ABNORMAL LOW (ref 98–111)
Chloride: 95 mmol/L — ABNORMAL LOW (ref 98–111)
Creatinine, Ser: 4.96 mg/dL — ABNORMAL HIGH (ref 0.44–1.00)
Creatinine, Ser: 5.62 mg/dL — ABNORMAL HIGH (ref 0.44–1.00)
GFR, Estimated: 10 mL/min — ABNORMAL LOW
GFR, Estimated: 9 mL/min — ABNORMAL LOW
Glucose, Bld: 137 mg/dL — ABNORMAL HIGH (ref 70–99)
Glucose, Bld: 143 mg/dL — ABNORMAL HIGH (ref 70–99)
Phosphorus: 5.1 mg/dL — ABNORMAL HIGH (ref 2.5–4.6)
Phosphorus: 5.6 mg/dL — ABNORMAL HIGH (ref 2.5–4.6)
Potassium: 5.1 mmol/L (ref 3.5–5.1)
Potassium: 5 mmol/L (ref 3.5–5.1)
Sodium: 131 mmol/L — ABNORMAL LOW (ref 135–145)
Sodium: 131 mmol/L — ABNORMAL LOW (ref 135–145)

## 2024-09-14 LAB — CBC
HCT: 31 % — ABNORMAL LOW (ref 36.0–46.0)
HCT: 29.1 % — ABNORMAL LOW (ref 36.0–46.0)
Hemoglobin: 9.2 g/dL — ABNORMAL LOW (ref 12.0–15.0)
Hemoglobin: 8.7 g/dL — ABNORMAL LOW (ref 12.0–15.0)
MCH: 24.3 pg — ABNORMAL LOW (ref 26.0–34.0)
MCH: 24.4 pg — ABNORMAL LOW (ref 26.0–34.0)
MCHC: 29.7 g/dL — ABNORMAL LOW (ref 30.0–36.0)
MCHC: 29.9 g/dL — ABNORMAL LOW (ref 30.0–36.0)
MCV: 81.8 fL (ref 80.0–100.0)
MCV: 81.5 fL (ref 80.0–100.0)
Platelets: 309 10*3/uL (ref 150–400)
Platelets: 323 10*3/uL (ref 150–400)
RBC: 3.79 MIL/uL — ABNORMAL LOW (ref 3.87–5.11)
RBC: 3.57 MIL/uL — ABNORMAL LOW (ref 3.87–5.11)
RDW: 17.9 % — ABNORMAL HIGH (ref 11.5–15.5)
RDW: 18.7 % — ABNORMAL HIGH (ref 11.5–15.5)
WBC: 8.1 10*3/uL (ref 4.0–10.5)
WBC: 10.3 10*3/uL (ref 4.0–10.5)
nRBC: 0 % (ref 0.0–0.2)
nRBC: 0 % (ref 0.0–0.2)

## 2024-09-14 LAB — PARATHYROID HORMONE, INTACT (NO CA): PTH: 100 pg/mL — ABNORMAL HIGH (ref 15–65)

## 2024-09-14 LAB — MAGNESIUM: Magnesium: 2.2 mg/dL (ref 1.7–2.4)

## 2024-09-14 MED ORDER — PENTAFLUOROPROP-TETRAFLUOROETH EX AERO: 1.0000 | INHALATION_SPRAY | CUTANEOUS | Status: DC | PRN

## 2024-09-14 MED ORDER — TACROLIMUS 1 MG PO CAPS
1.0000 mg | ORAL_CAPSULE | Freq: Two times a day (BID) | ORAL | Status: AC
  Administered 2024-09-14 – 2024-09-17 (×6): 1 mg via ORAL
  Filled 2024-09-14 (×6): qty 1

## 2024-09-14 MED ORDER — ALTEPLASE 2 MG IJ SOLR: 2.0000 mg | Freq: Once | INTRAMUSCULAR | Status: DC | PRN

## 2024-09-14 MED ORDER — ANTICOAGULANT SODIUM CITRATE 4% (200MG/5ML) IV SOLN: 5.0000 mL | Status: DC | PRN

## 2024-09-14 MED ORDER — LIDOCAINE HCL (PF) 1 % IJ SOLN: 5.0000 mL | INTRAMUSCULAR | Status: DC | PRN

## 2024-09-14 MED ORDER — HEPARIN SODIUM (PORCINE) 1000 UNIT/ML DIALYSIS: 1000.0000 [IU] | INTRAMUSCULAR | Status: DC | PRN

## 2024-09-14 MED ORDER — LIDOCAINE-PRILOCAINE 2.5-2.5 % EX CREA: 1.0000 | TOPICAL_CREAM | CUTANEOUS | Status: DC | PRN

## 2024-09-14 NOTE — Plan of Care (Signed)

## 2024-09-14 NOTE — Assessment & Plan Note (Addendum)
 Reported pain and edema in the evening 2/1. Initially thought to be 2/2 cellulitis. However, upon further assessment, most likely due to gout.  -Keflex  Dc'd - Presnisone 50 mg daily (2/2 - 2/6)

## 2024-09-14 NOTE — Assessment & Plan Note (Signed)
 Benadryl  cream to affected areas PRN

## 2024-09-14 NOTE — Assessment & Plan Note (Addendum)
 Overnight, 4 beat run of V-tach. Hypertensive overnight up to 182 systolic and 107 diastolic. Patient asymptomatic with both.  Cardiology following, recommendation as below:  - Diuresis via dialysis - On Eliquis  5 mg twice daily   - Continue blood pressure regimen (clonidine  0.2 TID) Transition to metoprolol  succinate 50 mg today  - Transfusion threshold  <7 VVS recommendations  -Patient does not need ASA and Plavix  at discharge if on Eliquis  - CCM - Daily weights, Strict I/Os - Daily BMP, Mag (K>4, Mg> 2)  - Continue PT/OT  - Tylenol  650 mg q6hrs PRN

## 2024-09-14 NOTE — Assessment & Plan Note (Addendum)
 Head CT on 2/2 showed no acute intracranial abnormality. A remote infarct in the right temporal lobe was seen. This was previously noted on a head CT from 2015. VBG within normal limits.  Suspect this is most likely due to volume overload/sleep deprivation as patient states this happened when she was previously on dialysis as well. - dialysis today

## 2024-09-14 NOTE — Assessment & Plan Note (Addendum)
 Presented with uremia and volume overload.  New ESRD on HD after kidney transplant failure.   S/p [facility] 1/29.  HD planned for today. Hemoglobin remains stable at 9.2 this a.m.  Nephrology following, recommendations as below:  - Continue HD with UF  - continue aranesp  100 mcg weekly   - continue ferrous sulfate  325 every other day  - Daily RFP, CBC, and Mag plan to replete electrolytes prn - Continue home prograf  (3 mg BID) and myfortic  (720 mg BID)  - Labs pending: PTH, lipoprotein A  - Fluid restriction - Of note, patient had slightly elevated beta-hCG, this is thought to be secondary to renal failure.  Patient is monogamous with female partner pelvic ultrasound 1/29 negative.  Downtrended from 27 to 20.

## 2024-09-14 NOTE — Progress Notes (Signed)
 "  Rounding Note   Patient Name: [name] Date of Encounter: 09/14/2024  [facility] Cardiologist: None   Subjective Feeling confused.  R foot pain and feels tight all over  Scheduled Meds:  apixaban   5 mg Oral BID   atorvastatin   40 mg Oral Daily   Chlorhexidine  Gluconate Cloth  6 each Topical Q0600   Chlorhexidine  Gluconate Cloth  6 each Topical Q0600   cloNIDine   0.2 mg Oral TID   darbepoetin (ARANESP ) injection - DIALYSIS  100 mcg Subcutaneous Q Thu-1800   famotidine   10 mg Oral QHS   ferrous sulfate   325 mg Oral QODAY   lidocaine   1 patch Transdermal Q24H   metoprolol  succinate  50 mg Oral Daily   mycophenolate   360 mg Oral BID   nystatin  cream   Topical BID   polyethylene glycol  17 g Oral Daily   predniSONE   50 mg Oral Q breakfast   senna  1 tablet Oral Daily   tacrolimus   1 mg Oral BID   triamcinolone  cream  1 Application Topical TID   zolpidem   5 mg Oral QHS   Continuous Infusions:  PRN Meds: acetaminophen  **OR** [DISCONTINUED] acetaminophen , diphenhydrAMINE -zinc  acetate, lidocaine    Vital Signs  Vitals:   09/14/24 0344 09/14/24 0500 09/14/24 0737 09/14/24 0812  BP: (!) 182/97  (!) 159/106 (!) 150/100  Pulse: 65  64 64  Resp: 15  16 16   Temp: 97.6 F (36.4 C)  (!) 97.5 F (36.4 C)   TempSrc: Oral  Oral   SpO2: 100%  99% 98%  Weight:  87.9 kg    Height:        Intake/Output Summary (Last 24 hours) at 09/14/2024 1148 Last data filed at 09/14/2024 1000 Gross per 24 hour  Intake 360 ml  Output --  Net 360 ml      09/14/2024    5:00 AM 09/13/2024    5:29 AM 09/12/2024    4:05 PM  Last 3 Weights  Weight (lbs) 193 lb 12.6 oz 191 lb 5.8 oz 209 lb 3.5 oz  Weight (kg) 87.9 kg 86.8 kg 94.9 kg      Telemetry Sinus rhythm - Personally Reviewed  ECG  N/a - Personally Reviewed  Physical Exam  VS:  BP (!) 150/100 (BP Location: Left Arm)   Pulse 64   Temp (!) 97.5 F (36.4 C) (Oral)   Resp 16   Ht 5' 1.5 (1.562 m)   Wt 87.9 kg    SpO2 98%   BMI 36.02 kg/m  , BMI Body mass index is 36.02 kg/m. GENERAL:  Chronically ill-appearing.  Confused. Answers questions appropriately at times.  Active hallucinations.  HEENT: Pupils equal round and reactive, fundi not visualized, oral mucosa unremarkable NECK:  No jugular venous distention, waveform within normal limits, carotid upstroke brisk and symmetric, no bruits, no thyromegaly LUNGS:  Clear to auscultation bilaterally HEART:  RRR.  PMI not displaced or sustained,S1 and S2 within normal limits, no S3, no S4, no clicks, no rubs, no murmurs ABD:  Flat, positive bowel sounds normal in frequency in pitch, no bruits, no rebound, no guarding, no midline pulsatile mass, no hepatomegaly, no splenomegaly EXT:  2 plus pulses throughout, 2+ edema in all 4 extremities.  No cyanosis no clubbing SKIN:  No rashes no nodules NEURO:  Cranial nerves II through XII grossly intact, motor grossly intact throughout PSYCH:  Confused   Labs High Sensitivity Troponin:  No results for input(s): TROPONINIHS in the  last 720 hours.  Recent Labs  Lab 09/08/24 1929 09/08/24 2218 09/09/24 0109  TRNPT 231* 232* 236*       Chemistry Recent Labs  Lab 09/12/24 0219 09/13/24 0155 09/14/24 0350  NA 131* 132* 131*  K 4.9 3.9 5.1  CL 97* 94* 95*  CO2 19* 23 22  GLUCOSE 97 105* 137*  BUN 61* 39* 52*  CREATININE 5.12* 3.94* 4.96*  CALCIUM  7.9* 7.6* 8.4*  MG 2.3 2.0 2.2  ALBUMIN 2.0* 2.2* 2.4*  GFRNONAA 10* 14* 10*  ANIONGAP 15 15 14     Lipids  Recent Labs  Lab 09/11/24 0727  CHOL 65  TRIG 80  HDL 29*  LDLCALC 20  CHOLHDL 2.3    Hematology Recent Labs  Lab 09/12/24 0219 09/13/24 0155 09/14/24 0350  WBC 10.2 9.5 8.1  RBC 3.45* 3.27* 3.79*  HGB 8.2* 7.9* 9.2*  HCT 27.3* 26.6* 31.0*  MCV 79.1* 81.3 81.8  MCH 23.8* 24.2* 24.3*  MCHC 30.0 29.7* 29.7*  RDW 17.1* 17.5* 17.9*  PLT 287 289 309   Thyroid  Recent Labs  Lab 09/08/24 2218  TSH 2.530    BNP Recent Labs  Lab  09/08/24 1929  PROBNP >35,000.0*    DDimer No results for input(s): DDIMER in the last 168 hours.   Radiology  CT HEAD WO CONTRAST ( ) Result Date: 09/13/2024 EXAM: CT HEAD WITHOUT CONTRAST 09/13/2024 08:44:05 PM TECHNIQUE: CT of the head was performed without the administration of intravenous contrast. Automated exposure control, iterative reconstruction, and/or weight based adjustment of the mA/kV was utilized to reduce the radiation dose to as low as reasonably achievable. COMPARISON: None available. CLINICAL HISTORY: Confusion. FINDINGS: BRAIN AND VENTRICLES: No acute hemorrhage. Remote infarct in right temporal lobe. No hydrocephalus. No extra-axial collection. No mass effect or midline shift. ORBITS: Right lens replacement. SINUSES: No acute abnormality. SOFT TISSUES AND SKULL: No acute soft tissue abnormality. No skull fracture. IMPRESSION: 1. No acute intracranial abnormality. 2. Remote infarct in the right temporal lobe. Electronically signed by: Franky Stanford MD 09/13/2024 09:38 PM EST RP Workstation: [id number]   VAS US  LOWER EXTREMITY VENOUS (DVT) Result Date: 09/13/2024  Lower Venous DVT Study Patient Name:  [name]  Date of Exam:   09/13/2024 Medical Rec #: [id number]            Accession #:    [id number] Date of Birth: 1976-03-09           Patient Gender: F Patient Age:   49 years Exam Location:  [facility] Procedure:      VAS US  LOWER EXTREMITY VENOUS (DVT) Referring Phys: LAYMON LEGIONS --------------------------------------------------------------------------------  Indications: Edema.  Limitations: Poor ultrasound/tissue interface and body habitus. Comparison Study: Previous exam on 12/18/2023 was negative for DVT Performing Technologist: Ezzie Potters RVT, RDMS  Examination Guidelines: A complete evaluation includes B-mode imaging, spectral Doppler, color Doppler, and power Doppler as needed of all accessible portions of each vessel. Bilateral testing is considered an  integral part of a complete examination. Limited examinations for reoccurring indications may be performed as noted. The reflux portion of the exam is performed with the patient in reverse Trendelenburg.  +---------+---------------+---------+-----------+----------+-------------------+ RIGHT    CompressibilityPhasicitySpontaneityPropertiesThrombus Aging      +---------+---------------+---------+-----------+----------+-------------------+ CFV      Full           No       Yes                                      +---------+---------------+---------+-----------+----------+-------------------+  SFJ      Full                                                             +---------+---------------+---------+-----------+----------+-------------------+ FV Prox  Full           No       Yes                                      +---------+---------------+---------+-----------+----------+-------------------+ FV Mid   Full           No       Yes                                      +---------+---------------+---------+-----------+----------+-------------------+ FV DistalFull           No       Yes                                      +---------+---------------+---------+-----------+----------+-------------------+ PFV      Full                                                             +---------+---------------+---------+-----------+----------+-------------------+ POP      Full           No       Yes                                      +---------+---------------+---------+-----------+----------+-------------------+ PTV      Full                                         Not well visualized +---------+---------------+---------+-----------+----------+-------------------+ PERO     Full                                         Not well visualized +---------+---------------+---------+-----------+----------+-------------------+ Pulsatile doppler waveforms throughout  lower extremity  +----+---------------+---------+-----------+----------+--------------+ LEFTCompressibilityPhasicitySpontaneityPropertiesThrombus Aging +----+---------------+---------+-----------+----------+--------------+ CFV Full           No       Yes        pulsatile                +----+---------------+---------+-----------+----------+--------------+    Summary: RIGHT: - There is no evidence of deep vein thrombosis in the lower extremity.  - No cystic structure found in the popliteal fossa. Diffuse subcutaneous edema throughout lower extremity  LEFT: - No evidence of common femoral vein obstruction.   *See table(s) above for measurements and observations. Electronically signed by Gaile New MD on 09/13/2024 at 10:45:18 AM.  Final     Cardiac Studies Echo 10/03/22:  1. Left ventricular ejection fraction, by estimation, is 55 to 60%. The  left ventricle has normal function. The left ventricle has no regional  wall motion abnormalities. Left ventricular diastolic parameters are  consistent with Grade II diastolic  dysfunction (pseudonormalization).   2. Right ventricular systolic function is normal. The right ventricular  size is normal. There is normal pulmonary artery systolic pressure. The  estimated right ventricular systolic pressure is 32.2 mmHg.   3. Left atrial size was mildly dilated.   4. The mitral valve is normal in structure. Mild mitral valve  regurgitation. No evidence of mitral stenosis.   5. The aortic valve is normal in structure. Aortic valve regurgitation is  mild to moderate. No aortic stenosis is present.   6. The inferior vena cava is normal in size with greater than 50%  respiratory variability, suggesting right atrial pressure of 3 mmHg.   Echo 09/10/24:  1. Left ventricular ejection fraction, by estimation, is 30 to 35%. The  left ventricle has moderately decreased function. The left ventricle  demonstrates regional wall motion abnormalities (see  scoring  diagram/findings for description). There is mild  concentric left ventricular hypertrophy. Left ventricular diastolic  parameters are consistent with Grade II diastolic dysfunction  (pseudonormalization). There is the interventricular septum is flattened  in diastole ('D' shaped left ventricle), consistent   with right ventricular volume overload.   2. Right ventricular systolic function is mildly reduced. The right  ventricular size is moderately enlarged. There is moderately elevated  pulmonary artery systolic pressure.   3. Left atrial size was mildly dilated.   4. Right atrial size was moderately dilated.   5. Moderate pleural effusion.   6. The mitral valve is normal in structure. Mild mitral valve  regurgitation. No evidence of mitral stenosis.   7. Tricuspid valve regurgitation is moderate to severe.   8. The aortic valve is tricuspid. Aortic valve regurgitation is moderate.  No aortic stenosis is present.   9. The inferior vena cava is dilated in size with <50% respiratory  variability, suggesting right atrial pressure of 15 mmHg.   Patient Profile   49 y.o. female  with ESRD s/p failed kidney transplant, hypertension, anemia of chronic disease, SVC syndrome s/p SVC stent, non-obstructive CAD admitted with volume overload in the setting of failed renal transplant, acute systolic heart failure and atrial fibrillation.   Assessment & Plan   # Acute HFrEF:  LVEF 30-35% down from 55% previously.   She remains quite volume overloaded.  Continue volume management with HD.  Going for HD today.  If LVEF remains reduced on 3 month echo, consider ischemic evaluation.  No contrast now given her renal dysfunction.  Also not a candidate for cath while confused.    # PAF:  Now maintaining sinus rhythm.  Given her systolic function, recommend trying to maintain sinus rhythm. Continue Eliquis  and metoprolol   Will transition metoprolol  to succinate given her systolic dysfuntion.    #  Acute on chronic renal failure:  S/p renal transplant now admitted with failed transplant requiring HD, unfortunately.  She remains intermittently confused despite improvement in BUN.  Would like to use an ARB if/when cleared by nephrology.   # Hyperlipidemia:  Continue atorvastatin .   # Hypertension:  BP controlled on clonidine  and metoprolol . Recommend ARB if able if there is no renal recovery.     For questions or updates, please contact [facility] Please consult [email or web address]  for contact info under       Signed, Annabella Scarce, MD  09/14/2024, 11:48 AM    "

## 2024-09-14 NOTE — Assessment & Plan Note (Addendum)
 CAD/SVC syndrome s/p stent -no DAPT needed while on DOAC per VVS as above HLD - change crestor  20mg  to Lipitor 40mg  for renal clearance GERD - famotidine  10 mg (renal dosing) Constipation - continue Miralax  and Senna daily

## 2024-09-15 DIAGNOSIS — E8779 Other fluid overload: Secondary | ICD-10-CM | POA: Diagnosis not present

## 2024-09-15 LAB — MAGNESIUM: Magnesium: 2 mg/dL (ref 1.7–2.4)

## 2024-09-15 LAB — RENAL FUNCTION PANEL
Albumin: 2.4 g/dL — ABNORMAL LOW (ref 3.5–5.0)
Anion gap: 17 — ABNORMAL HIGH (ref 5–15)
BUN: 37 mg/dL — ABNORMAL HIGH (ref 6–20)
CO2: 23 mmol/L (ref 22–32)
Calcium: 8.4 mg/dL — ABNORMAL LOW (ref 8.9–10.3)
Chloride: 94 mmol/L — ABNORMAL LOW (ref 98–111)
Creatinine, Ser: 4.11 mg/dL — ABNORMAL HIGH (ref 0.44–1.00)
GFR, Estimated: 13 mL/min — ABNORMAL LOW
Glucose, Bld: 117 mg/dL — ABNORMAL HIGH (ref 70–99)
Phosphorus: 4.6 mg/dL (ref 2.5–4.6)
Potassium: 4.3 mmol/L (ref 3.5–5.1)
Sodium: 133 mmol/L — ABNORMAL LOW (ref 135–145)

## 2024-09-15 LAB — CBC
HCT: 31.8 % — ABNORMAL LOW (ref 36.0–46.0)
Hemoglobin: 9.3 g/dL — ABNORMAL LOW (ref 12.0–15.0)
MCH: 24.2 pg — ABNORMAL LOW (ref 26.0–34.0)
MCHC: 29.2 g/dL — ABNORMAL LOW (ref 30.0–36.0)
MCV: 82.8 fL (ref 80.0–100.0)
Platelets: 316 10*3/uL (ref 150–400)
RBC: 3.84 MIL/uL — ABNORMAL LOW (ref 3.87–5.11)
RDW: 18.9 % — ABNORMAL HIGH (ref 11.5–15.5)
WBC: 11.6 10*3/uL — ABNORMAL HIGH (ref 4.0–10.5)
nRBC: 0 % (ref 0.0–0.2)

## 2024-09-15 LAB — LIPOPROTEIN A (LPA): Lipoprotein (a): 33.9 nmol/L — ABNORMAL HIGH

## 2024-09-15 MED ORDER — HEPARIN SODIUM (PORCINE) 1000 UNIT/ML IJ SOLN
INTRAMUSCULAR | Status: AC
  Filled 2024-09-15: qty 5

## 2024-09-15 NOTE — Assessment & Plan Note (Signed)
 Overnight 3.9 L off during dialysis. PTH elevated to 100. Lipoprotein A 33.9.  Nephrology following, recommendations as below:  - Continue HD with UF  - continue aranesp  100 mcg weekly   - continue ferrous sulfate  325 every other day   - weaning off Myfortic  as it can cause neurologic abnormalities at high levels   - decreased Myfortic  from 3 mg BID to 1 mg BID  - tacrolimus  level pending  - Daily RFP, CBC, and Mag plan to replete electrolytes prn - Continue home prograf  (3 mg BID) and myfortic  (360 mg BID)  - Fluid restriction 

## 2024-09-15 NOTE — Assessment & Plan Note (Signed)
 CAD/SVC syndrome s/p stent -no DAPT needed while on DOAC per VVS as above HLD - change crestor  20mg  to Lipitor 40mg  for renal clearance GERD - famotidine  10 mg (renal dosing) Constipation - continue Miralax  and Senna daily

## 2024-09-15 NOTE — Assessment & Plan Note (Addendum)
 Differential includes volume overload, sleep deprivation, and elevated tacrolimus  level per Nephrology.  - dialysis overnight, 3.9 L off, will continue per Nephrology  - Tacrolimus  level pending

## 2024-09-15 NOTE — Plan of Care (Signed)
  Problem: Clinical Measurements: Goal: Will remain free from infection Outcome: Progressing   Problem: Activity: Goal: Risk for activity intolerance will decrease Outcome: Progressing   Problem: Nutrition: Goal: Adequate nutrition will be maintained Outcome: Progressing   Problem: Coping: Goal: Level of anxiety will decrease Outcome: Progressing   

## 2024-09-15 NOTE — Assessment & Plan Note (Signed)
 Pressures continue to be elevated, but improving from yesterday. Pulse stable.  Cardiology following, recommendation as below:  - Diuresis via dialysis - On Eliquis  5 mg twice daily   - Continue blood pressure regimen (clonidine  0.2 TID) Transition to metoprolol  succinate 50 mg today  - Transfusion threshold  <7 VVS recommendations  -Patient does not need ASA and Plavix  at discharge if on Eliquis  - CCM - Daily weights, Strict I/Os - Daily BMP, Mag (K>4, Mg> 2)  - Continue PT/OT  - Tylenol  650 mg q6hrs PRN

## 2024-09-15 NOTE — Progress Notes (Signed)
 [name] NEPHROLOGY PROGRESS NOTE  Assessment/ Plan: Pt is a 49 y.o. yo female  with past medical history significant for obesity, hypertension, anemia, dyslipidemia, SVC syndrome, ESRD status post LRKT in 2010 at [facility] presented with worsening shortness of breath, fluid overload, seen as a consultation for the management of new ESRD.   # New ESRD after failed kidney transplant: Complicate uremia and volume overload. -s/p LIJ TDC by IR on 1/29 and received first dialysis. -Renal navigator is following to arrange outpatient HD for ESRD, going to TCU. 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  -Continue HD on TTS schedule   # Kidney transplant LRKT (mother) in 2010 at [facility]: Myfortic  weaned. Unclear cause of hallucinations but high tac level can cause neurological abnormalities. Decreased from 3mg  BID to 1mg  BID and f/u trough   # Acute CHF exacerbation: Elevated BNP, UF with dialysis.  Cardiology is following.   # New onset A-fib with RVR: mgmt per primary and cardiology   # Anemia of CKD: Noted iron  saturation of 6%.  Ordered IV iron  and started Aranesp .  Also required blood transfusion.  Monitor lab.   # CKD-MBD:PTH 100, no therapy needed, hyperphosphatemia improved with dialysis.   # Hyponatremia, hypervolemia: UF with HD, fluid restriction and monitor lab.  Subjective: Patient feels better today. HD went well yesterday. Nearly 4L removed. Less hallucinations  Objective Vital signs in last 24 hours: Vitals:   09/15/24 0202 09/15/24 0358 09/15/24 0624 09/15/24 0818  BP: (!) 173/90 (!) 143/88  138/84  Pulse: 65 64  65  Resp: 13 10  19   Temp: 97.6 F (36.4 C) 97.6 F (36.4 C)  (!) 97.5 F (36.4 C)  TempSrc:  Axillary  Oral  SpO2: 93% 97%  98%  Weight:   91.4 kg   Height:       Weight change: 3.5 kg  Intake/Output Summary (Last 24 hours) at 09/15/2024 1109 Last data filed at 09/15/2024  0202 Gross per 24 hour  Intake 360 ml  Output 3900 ml  Net -3540 ml       Labs: RENAL PANEL Recent Labs  Lab 09/10/24 0440 09/11/24 0725 09/12/24 0219 09/13/24 0155 09/14/24 0350 09/14/24 2043  NA 130* 134* 131* 132* 131* 131*  K 4.3 4.4 4.9 3.9 5.1 5.0  CL 98 99 97* 94* 95* 95*  CO2 18* 21* 19* 23 22 22   GLUCOSE 90 76 97 105* 137* 143*  BUN 77* 53* 61* 39* 52* 60*  CREATININE 5.47* 4.54* 5.12* 3.94* 4.96* 5.62*  CALCIUM  7.6* 7.7* 7.9* 7.6* 8.4* 8.7*  MG 1.9 2.1 2.3 2.0 2.2  --   PHOS 5.1* 3.9 4.9* 4.0 5.1* 5.6*  ALBUMIN 2.0* 2.0* 2.0* 2.2* 2.4* 2.5*    Liver Function Tests: Recent Labs  Lab 09/13/24 0155 09/14/24 0350 09/14/24 2043  ALBUMIN 2.2* 2.4* 2.5*   No results for input(s): LIPASE, AMYLASE in the last 168 hours. No results for input(s): AMMONIA in the last 168 hours. CBC: Recent Labs    09/09/24 0309 09/10/24 0440 09/11/24 0725 09/12/24 0219 09/13/24 0155 09/14/24 0350 09/14/24 2043  HGB  --    < > 8.0* 8.2* 7.9* 9.2* 8.7*  MCV  --    < > 77.8* 79.1* 81.3 81.8 81.5  VITAMINB12 1,346*  --   --   --   --   --   --   FOLATE 9.4  --   --   --   --   --   --  FERRITIN 169  --   --   --   --   --   --   TIBC 239*  --   --   --   --   --   --   IRON  13*  --   --   --   --   --   --   RETICCTPCT 2.1  --   --   --   --   --   --    < > = values in this interval not displayed.    Cardiac Enzymes: No results for input(s): CKTOTAL, CKMB, CKMBINDEX, TROPONINI in the last 168 hours. CBG: Recent Labs  Lab 09/13/24 0945  GLUCAP 97    Iron  Studies:  No results for input(s): IRON , TIBC, TRANSFERRIN, FERRITIN in the last 72 hours.  Studies/Results: CT HEAD WO CONTRAST ( ) Result Date: 09/13/2024 EXAM: CT HEAD WITHOUT CONTRAST 09/13/2024 08:44:05 PM TECHNIQUE: CT of the head was performed without the administration of intravenous contrast. Automated exposure control, iterative reconstruction, and/or weight based adjustment of  the mA/kV was utilized to reduce the radiation dose to as low as reasonably achievable. COMPARISON: None available. CLINICAL HISTORY: Confusion. FINDINGS: BRAIN AND VENTRICLES: No acute hemorrhage. Remote infarct in right temporal lobe. No hydrocephalus. No extra-axial collection. No mass effect or midline shift. ORBITS: Right lens replacement. SINUSES: No acute abnormality. SOFT TISSUES AND SKULL: No acute soft tissue abnormality. No skull fracture. IMPRESSION: 1. No acute intracranial abnormality. 2. Remote infarct in the right temporal lobe. Electronically signed by: Franky Stanford MD 09/13/2024 09:38 PM EST RP Workstation: [id number]    Medications: Infusions:    Scheduled Medications:  apixaban   5 mg Oral BID   atorvastatin   40 mg Oral Daily   Chlorhexidine  Gluconate Cloth  6 each Topical Q0600   Chlorhexidine  Gluconate Cloth  6 each Topical Q0600   cloNIDine   0.2 mg Oral TID   darbepoetin (ARANESP ) injection - DIALYSIS  100 mcg Subcutaneous Q Thu-1800   famotidine   10 mg Oral QHS   ferrous sulfate   325 mg Oral QODAY   lidocaine   1 patch Transdermal Q24H   metoprolol  succinate  50 mg Oral Daily   mycophenolate   360 mg Oral BID   nystatin  cream   Topical BID   polyethylene glycol  17 g Oral Daily   predniSONE   50 mg Oral Q breakfast   senna  1 tablet Oral Daily   tacrolimus   1 mg Oral BID   triamcinolone  cream  1 Application Topical TID   zolpidem   5 mg Oral QHS    have reviewed scheduled and prn medications.  Physical Exam: General:NAD, comfortable Heart: Normal rate no rub Lungs: Bilateral chest rise with no increased work of breathing  abdomen:soft, Non-tender, non-distended Extremities: 1+ edema in all 4 extremities  Dialysis Access: Left IJ TDC, no active bleed  [facility] 09/15/2024,11:09 AM  LOS: 7 days

## 2024-09-15 NOTE — Progress Notes (Signed)
 Physical Therapy Treatment Patient Details Name: [name] MRN: [id number] DOB: [birth year] Today's Date: 09/15/2024   History of Present Illness Pt is a 49 y.o. female who presented 09/08/24 with SOB and generalized peripheral edema. Pt admitted with volume overload and new A-fib with RVR. PMH: end-stage kidney disease s/p kidney transplant in 2010, HTN, SVC syndrome, HLD, anemia    PT Comments  Pt resting in bed on arrival, agreeable to session with encouragement and demonstrating steady progress towards acute goals. Pt performing bed mobility and transfers sit<>stand with grossly CGA for safety with increased time for initiation and sequencing. Pt progressing ambulation with RW for support and grossly CGA for safety with distance limited by continued R LE pain, localized a great toe. Pt up in chair at end of session and receptive to education on importance of frequent mobilization to maximize functional mobility gains. Pt was also educated on continued walker use to maximize functional independence, safety, and decrease risk for falls. Pt continues to benefit from skilled PT services to progress toward functional mobility goals.     If plan is discharge home, recommend the following: A little help with walking and/or transfers;A little help with bathing/dressing/bathroom;Assistance with cooking/housework;Assist for transportation;Help with stairs or ramp for entrance   Can travel by private vehicle        Equipment Recommendations  BSC/3in1;Rollator (4 wheels)    Recommendations for Other Services       Precautions / Restrictions Precautions Precautions: Fall;Other (comment) Precaution/Restrictions Comments: watch SpO2 Restrictions Weight Bearing Restrictions Per Provider Order: No     Mobility  Bed Mobility Overal bed mobility: Needs Assistance Bed Mobility: Supine to Sit, Sit to Supine     Supine to sit: Contact guard     General bed mobility comments: increased  time with HOB eleavted to come to R EOB    Transfers Overall transfer level: Needs assistance Equipment used: Rolling walker (2 wheels) Transfers: Sit to/from Stand, Bed to chair/wheelchair/BSC Sit to Stand: Contact guard assist           General transfer comment: pt standing from EOB at lowest height with CGA for safety, pt requiring increased time to come to standing    Ambulation/Gait Ambulation/Gait assistance: Contact guard assist Gait Distance (Feet): 48 Feet Assistive device: Rolling walker (2 wheels) Gait Pattern/deviations: Step-through pattern, Decreased step length - right, Decreased step length - left, Decreased stride length, Trunk flexed Gait velocity: decr     General Gait Details: intermittent R knee flexion when putting weight onto R foot however knee did not buckle, VC for support on RW to help with pain management   Stairs             Wheelchair Mobility     Tilt Bed    Modified Rankin (Stroke Patients Only)       Balance Overall balance assessment: Needs assistance Sitting-balance support: Feet supported, Bilateral upper extremity supported, Single extremity supported Sitting balance-Leahy Scale: Fair Sitting balance - Comments: supervision for safety   Standing balance support: Single extremity supported, Bilateral upper extremity supported, During functional activity Standing balance-Leahy Scale: Poor Standing balance comment: reliant on UE support                            Communication Communication Communication: No apparent difficulties  Cognition Arousal: Alert, Lethargic Behavior During Therapy: Flat affect  Following commands: Intact      Cueing Cueing Techniques: Verbal cues  Exercises      General Comments General comments (skin integrity, edema, etc.): VSS on RA, spouse present and supportive      Pertinent Vitals/Pain Pain Assessment Pain Assessment:  Faces Faces Pain Scale: Hurts a little bit Pain Location: R foot d/t gout Pain Descriptors / Indicators: Discomfort, Grimacing, Guarding Pain Intervention(s): Monitored during session, Limited activity within patient's tolerance    Home Living                          Prior Function            PT Goals (current goals can now be found in the care plan section) Acute Rehab PT Goals PT Goal Formulation: With patient Time For Goal Achievement: 09/23/24 Progress towards PT goals: Progressing toward goals    Frequency    Min 2X/week      PT Plan      Co-evaluation              AM-PAC PT 6 Clicks Mobility   Outcome Measure  Help needed turning from your back to your side while in a flat bed without using bedrails?: A Little Help needed moving from lying on your back to sitting on the side of a flat bed without using bedrails?: A Little Help needed moving to and from a bed to a chair (including a wheelchair)?: A Little Help needed standing up from a chair using your arms (e.g., wheelchair or bedside chair)?: A Little Help needed to walk in hospital room?: A Little Help needed climbing 3-5 steps with a railing? : A Lot 6 Click Score: 17    End of Session   Activity Tolerance: Patient tolerated treatment well Patient left: in chair;with call bell/phone within reach Nurse Communication: Mobility status PT Visit Diagnosis: Unsteadiness on feet (R26.81);Other abnormalities of gait and mobility (R26.89);Muscle weakness (generalized) (M62.81);Difficulty in walking, not elsewhere classified (R26.2)     Time: 8540-8481 PT Time Calculation (min) (ACUTE ONLY): 19 min  Charges:    $Gait Training: 8-22 mins PT General Charges $$ ACUTE PT VISIT: 1 Visit                     Leelyn Jasinski R. PTA Acute Rehabilitation Services Office: [phone]   Therisa CHRISTELLA Boor 09/15/2024, 3:58 PM

## 2024-09-15 NOTE — Assessment & Plan Note (Addendum)
 Gout. Pain improving.   - Presnisone 50 mg daily (2/2 - 2/6)

## 2024-09-15 NOTE — Progress Notes (Signed)
 "    Daily Progress Note Intern Pager: [phone]  Patient name: [name] Medical record number: [id number] Date of birth: [birth year] Age: 49 y.o. Gender: female  Primary Care Provider: Cristopher Suzen HERO, NP Consultants: Nephrology, cardiology, VVS, IR  Code Status: DNR   Pt Overview and Major Events to Date:  1/28: admitted for new onset A-fib ISO of volume overload and ESRD with h/o renal transplant 1/29: TDC placed successfully by IR, HD started 1/30: CCM consulted for central line placement, unable to place, holding for now  Medical Decision Making:  LATONYIA [name] is a 49 y.o. female with a PMH of ESRD s/p L kidney transplant in 2010, chronic glomerulonephritis, HFpEF, SVC syndrome s/p successful recanalization complicated by SVC tear and cardiac tamponade in 2018 admitted for CHF exacerbation and new onset a-fib in the setting of volume overload.  Assessment & Plan PAF (paroxysmal atrial fibrillation) (HCC) Volume overload Acute systolic heart failure (HCC) Pressures continue to be elevated, but improving from yesterday. Pulse stable.  Cardiology following, recommendation as below:  - Diuresis via dialysis - On Eliquis  5 mg twice daily   - Continue blood pressure regimen (clonidine  0.2 TID) Transition to metoprolol  succinate 50 mg today  - Transfusion threshold  <7 VVS recommendations  -Patient does not need ASA and Plavix  at discharge if on Eliquis  - CCM - Daily weights, Strict I/Os - Daily BMP, Mag (K>4, Mg> 2)  - Continue PT/OT  - Tylenol  650 mg q6hrs PRN Kidney transplant failure IDA (iron  deficiency anemia) Chronic renal failure, stage 5 (HCC) Overnight 3.9 L off during dialysis. PTH elevated to 100. Lipoprotein A 33.9.  Nephrology following, recommendations as below:  - Continue HD with UF  - continue aranesp  100 mcg weekly   - continue ferrous sulfate  325 every other day   - weaning off Myfortic  as it can cause neurologic abnormalities at high  levels   - decreased Myfortic  from 3 mg BID to 1 mg BID  - tacrolimus  level pending  - Daily RFP, CBC, and Mag plan to replete electrolytes prn - Continue home prograf  (3 mg BID) and myfortic  (360 mg BID)  - Fluid restriction Confusion with non-focal neuro exam Differential includes volume overload, sleep deprivation, and elevated tacrolimus  level per Nephrology.  - dialysis overnight, 3.9 L off, will continue per Nephrology  - Tacrolimus  level pending  Right foot pain Gout. Pain improving.   - Presnisone 50 mg daily (2/2 - 2/6) Contact dermatitis Benadryl  cream to affected areas PRN Chronic health problem CAD/SVC syndrome s/p stent -no DAPT needed while on DOAC per VVS as above HLD - change crestor  20mg  to Lipitor 40mg  for renal clearance GERD - famotidine  10 mg (renal dosing) Constipation - continue Miralax  and Senna daily     FEN/GI: Renal, 1200 mL fluid restriction  PPx: On Eliquis   Dispo: Pending clinical improvement.   Subjective:  Patient states she is doing better this morning. She denies any hallucinations. However, her partner Rumalda who is present at bedside states she did have hallucinations again last night but that they were not as bad as they were. She states she is seeing her doing better.   Objective: Temp:  [97.4 F (36.3 C)-97.7 F (36.5 C)] 97.6 F (36.4 C) (02/04 0358) Pulse Rate:  [56-65] 64 (02/04 0358) Resp:  [10-20] 10 (02/04 0358) BP: (143-195)/(78-100) 143/88 (02/04 0358) SpO2:  [89 %-100 %] 97 % (02/04 0358) Weight:  [91.4 kg] 91.4 kg (02/04 9375) Physical Exam: General:  NAD  Cardiovascular: RRR, no M/R/G Respiratory: CTAB, normal work of breathing on room air  Abdomen: soft, non-distended, non-tender to palpation  Extremities: bilateral foot edema +1   Laboratory: Most recent CBC Lab Results  Component Value Date   WBC 10.3 09/14/2024   HGB 8.7 (L) 09/14/2024   HCT 29.1 (L) 09/14/2024   MCV 81.5 09/14/2024   PLT 323 09/14/2024    Most recent BMP    Latest Ref Rng & Units 09/14/2024    8:43 PM  BMP  Glucose 70 - 99 mg/dL 856   BUN 6 - 20 mg/dL 60   Creatinine 9.55 - 1.00 mg/dL 4.37   Sodium 864 - 854 mmol/L 131   Potassium 3.5 - 5.1 mmol/L 5.0   Chloride 98 - 111 mmol/L 95   CO2 22 - 32 mmol/L 22   Calcium  8.9 - 10.3 mg/dL 8.7     Other pertinent labs: Phosphorus 5.6  Albumin 2.5  Tacrolimus  level pending   Imaging/Diagnostic Tests: No new imaging.   Lennie Raguel MATSU, DO 09/15/2024, 7:54 AM  PGY-1, [facility] Family [facility] FPTS Intern pager: [phone], text pages welcome Secure chat group [facility] Teaching Service   "

## 2024-09-15 NOTE — Assessment & Plan Note (Signed)
 Benadryl  cream to affected areas PRN

## 2024-09-15 NOTE — Progress Notes (Signed)
 Spoke to pt's significant other via phone to offer an earlier appt time for out-pt HD at d/c. Significant other feels 1:00 pm chair time will work best due to her work schedule. TCU RN made aware of this info. Will assist as needed.   Randine Mungo Dialysis Navigator [phone]

## 2024-09-15 NOTE — Progress Notes (Addendum)
 Patient HR low as 44 during run. Aroused HR up to 58. Patient denied CP/SOB/Dizziness. NAD noted post HD  09/15/24 0202  Vitals  Temp 97.6 F (36.4 C)  Pulse Rate 65  Resp 13  BP (!) 173/90  SpO2 93 %  O2 Device Room Air  Oxygen Therapy  Patient Activity (if Appropriate) In bed  Pulse Oximetry Type Continuous  Oximetry Probe Site Changed No  Post Treatment  Dialyzer Clearance Lightly streaked  Liters Processed 78  Fluid Removed (mL) 3900 mL  Tolerated HD Treatment Yes  Post-Hemodialysis Comments Tolerated HD well. Dressing changed per protocol. Tolerated well

## 2024-09-15 NOTE — Progress Notes (Signed)
 Occupational Therapy Treatment Patient Details Name: [name] MRN: [id number] DOB: 11-01-1975 Today's Date: 09/15/2024   History of present illness Pt is a 49 y.o. female who presented 09/08/24 with SOB and generalized peripheral edema. Pt admitted with volume overload and new A-fib with RVR. PMH: end-stage kidney disease s/p kidney transplant in 2010, HTN, SVC syndrome, HLD, anemia   OT comments  Patient premedicated before session with 1/10 for right foot pain and increased to 3/10 at end of session. Patient demonstrating gains with bed mobility, sit to stands, and transfers due to decreased pain. Patient requiring increased time to perform tasks due to fatigue.  Patient became more lethargic towards end of session and returned to supine.  Discharge recommendations continue to be appropriate for home with HHOT to follow.  Acute OT to continue to follow to address established goals.       If plan is discharge home, recommend the following:  A lot of help with walking and/or transfers;A lot of help with bathing/dressing/bathroom;Assistance with cooking/housework;Assist for transportation;Help with stairs or ramp for entrance   Equipment Recommendations  BSC/3in1    Recommendations for Other Services      Precautions / Restrictions Precautions Precautions: Fall;Other (comment) Precaution/Restrictions Comments: watch SpO2 Restrictions Weight Bearing Restrictions Per Provider Order: No       Mobility Bed Mobility Overal bed mobility: Needs Assistance Bed Mobility: Supine to Sit, Sit to Supine     Supine to sit: Contact guard Sit to supine: Min assist, HOB elevated   General bed mobility comments: assistance with BLE to return to supine, increased time to perform    Transfers Overall transfer level: Needs assistance Equipment used: Rolling walker (2 wheels) Transfers: Sit to/from Stand, Bed to chair/wheelchair/BSC Sit to Stand: Min assist           General  transfer comment: min assist to stand from EOB and chair with cues for hand placement and min assist to ambulate to sink and back to EOB     Balance Overall balance assessment: Needs assistance Sitting-balance support: Feet supported, Bilateral upper extremity supported, Single extremity supported Sitting balance-Leahy Scale: Fair Sitting balance - Comments: supervision for safety   Standing balance support: Single extremity supported, Bilateral upper extremity supported, During functional activity Standing balance-Leahy Scale: Poor Standing balance comment: reliant on external support while standing, able to stand with one extremity support while performing grooming tasks                           ADL either performed or assessed with clinical judgement   ADL Overall ADL's : Needs assistance/impaired     Grooming: Wash/dry hands;Wash/dry face;Oral care;Contact guard assist;Standing   Upper Body Bathing: Minimal assistance;Sitting                             General ADL Comments: Patient required increased time to initiate self care tasks    Extremity/Trunk Assessment              Vision       Perception     Praxis     Communication Communication Communication: No apparent difficulties   Cognition Arousal: Alert, Lethargic Behavior During Therapy: Flat affect Cognition: No apparent impairments             OT - Cognition Comments: patient became more lethargic towards end of session  Following commands: Intact        Cueing   Cueing Techniques: Verbal cues  Exercises      Shoulder Instructions       General Comments VSS on RA, patient's significant other present and supportive    Pertinent Vitals/ Pain       Pain Assessment Pain Assessment: 0-10 Pain Score: 3  Pain Location: R foot d/t gout Pain Descriptors / Indicators: Discomfort, Grimacing, Guarding Pain Intervention(s): Limited activity within  patient's tolerance, Monitored during session, Premedicated before session, Repositioned  Home Living                                          Prior Functioning/Environment              Frequency  Min 2X/week        Progress Toward Goals  OT Goals(current goals can now be found in the care plan section)  Progress towards OT goals: Progressing toward goals  Acute Rehab OT Goals Patient Stated Goal: to feel better OT Goal Formulation: With patient Time For Goal Achievement: 09/23/24 Potential to Achieve Goals: Good  Plan      Co-evaluation                 AM-PAC OT 6 Clicks Daily Activity     Outcome Measure   Help from another person eating meals?: None Help from another person taking care of personal grooming?: A Little Help from another person toileting, which includes using toliet, bedpan, or urinal?: A Little Help from another person bathing (including washing, rinsing, drying)?: A Lot Help from another person to put on and taking off regular upper body clothing?: A Little Help from another person to put on and taking off regular lower body clothing?: A Lot 6 Click Score: 17    End of Session Equipment Utilized During Treatment: Gait belt;Rolling walker (2 wheels)  OT Visit Diagnosis: Muscle weakness (generalized) (M62.81);Other abnormalities of gait and mobility (R26.89)   Activity Tolerance Patient tolerated treatment well;Patient limited by lethargy   Patient Left in bed;with call bell/phone within reach;with bed alarm set;with family/visitor present   Nurse Communication Mobility status        Time: 9082-8984 OT Time Calculation (min): 58 min  Charges: OT General Charges $OT Visit: 1 Visit OT Treatments $Self Care/Home Management : 38-52 mins $Therapeutic Activity: 8-22 mins  [name], OTA Acute Rehabilitation Services  Office [phone]   [name] 09/15/2024, 2:42 PM

## 2024-09-16 DIAGNOSIS — N049 Nephrotic syndrome with unspecified morphologic changes: Secondary | ICD-10-CM

## 2024-09-16 DIAGNOSIS — N186 End stage renal disease: Secondary | ICD-10-CM

## 2024-09-16 LAB — CBC
HCT: 30.1 % — ABNORMAL LOW (ref 36.0–46.0)
Hemoglobin: 9 g/dL — ABNORMAL LOW (ref 12.0–15.0)
MCH: 24.3 pg — ABNORMAL LOW (ref 26.0–34.0)
MCHC: 29.9 g/dL — ABNORMAL LOW (ref 30.0–36.0)
MCV: 81.4 fL (ref 80.0–100.0)
Platelets: 347 10*3/uL (ref 150–400)
RBC: 3.7 MIL/uL — ABNORMAL LOW (ref 3.87–5.11)
RDW: 19.4 % — ABNORMAL HIGH (ref 11.5–15.5)
WBC: 12.8 10*3/uL — ABNORMAL HIGH (ref 4.0–10.5)
nRBC: 0.2 % (ref 0.0–0.2)

## 2024-09-16 LAB — RENAL FUNCTION PANEL
Albumin: 2.5 g/dL — ABNORMAL LOW (ref 3.5–5.0)
Anion gap: 14 (ref 5–15)
BUN: 49 mg/dL — ABNORMAL HIGH (ref 6–20)
CO2: 24 mmol/L (ref 22–32)
Calcium: 8.4 mg/dL — ABNORMAL LOW (ref 8.9–10.3)
Chloride: 95 mmol/L — ABNORMAL LOW (ref 98–111)
Creatinine, Ser: 4.74 mg/dL — ABNORMAL HIGH (ref 0.44–1.00)
GFR, Estimated: 11 mL/min — ABNORMAL LOW
Glucose, Bld: 120 mg/dL — ABNORMAL HIGH (ref 70–99)
Phosphorus: 4.6 mg/dL (ref 2.5–4.6)
Potassium: 5 mmol/L (ref 3.5–5.1)
Sodium: 132 mmol/L — ABNORMAL LOW (ref 135–145)

## 2024-09-16 LAB — URIC ACID: Uric Acid, Serum: 7.9 mg/dL — ABNORMAL HIGH (ref 2.5–7.1)

## 2024-09-16 LAB — MAGNESIUM: Magnesium: 2.1 mg/dL (ref 1.7–2.4)

## 2024-09-16 MED ORDER — METOPROLOL SUCCINATE ER 25 MG PO TB24
25.0000 mg | ORAL_TABLET | Freq: Every day | ORAL | Status: DC
  Filled 2024-09-16: qty 1

## 2024-09-16 MED ORDER — HEPARIN SODIUM (PORCINE) 1000 UNIT/ML IJ SOLN
INTRAMUSCULAR | Status: AC
  Filled 2024-09-16: qty 4

## 2024-09-16 MED ORDER — TORSEMIDE 100 MG PO TABS
100.0000 mg | ORAL_TABLET | Freq: Every day | ORAL | Status: DC
  Administered 2024-09-16: 100 mg via ORAL
  Filled 2024-09-16 (×2): qty 1

## 2024-09-16 NOTE — Progress Notes (Signed)
 "    Daily Progress Note Intern Pager: [phone]  Patient name: [name] Medical record number: [id number] Date of birth: 1976/07/10 Age: 49 y.o. Gender: female  Primary Care Provider: Cristopher Suzen HERO, NP Consultants: Nephrology, cardiology, VVS (SO), IR (SO) Code Status: DNR  Pt Overview and Major Events to Date:  1/28: admitted for new onset A-fib ISO of volume overload and ESRD with h/o renal transplant 1/29: TDC placed successfully by IR, HD started 1/30: CCM consulted for central line placement, unable to place, holding for now  Medical Decision Making:  [name] is a 49 y.o. female with a PMH of ESRD s/p L kidney transplant in 2010, chronic glomerulonephritis, HFpEF, SVC syndrome s/p successful recanalization complicated by SVC tear and cardiac tamponade in 2018 admitted for CHF exacerbation and new onset a-fib in the setting of volume overload.  Assessment & Plan PAF (paroxysmal atrial fibrillation) (HCC) Volume overload Acute systolic heart failure (HCC) Pulse remains stable. Continued elevated pressures, suspect to see continual improvement with HD.  Cardiology following, recommendation as below:  - Diuresis via dialysis - On Eliquis  5 mg twice daily   - Continue blood pressure regimen (clonidine  0.2 TID) Transition to metoprolol  succinate 50 mg today  - Transfusion threshold  <7 VVS recommendations  -Patient does not need ASA and Plavix  at discharge if on Eliquis  - CCM - Daily weights, Strict I/Os - Daily BMP, Mag (K>4, Mg> 2)  - Continue PT/OT  - Tylenol  650 mg q6hrs PRN Kidney transplant failure IDA (iron  deficiency anemia) Chronic renal failure, stage 5 (HCC) Continue dialysis. TTS schedule.  Nephrology following, recommendations as below:  - Continue HD with UF  - continue aranesp  100 mcg weekly   - continue ferrous sulfate  325 every other day   - weaning off Myfortic  as it can cause neurologic abnormalities at high levels   -  decreased Myfortic  from 3 mg BID to 1 mg BID  - tacrolimus  level pending  - Daily RFP, CBC, and Mag plan to replete electrolytes prn - Continue home prograf  (3 mg BID) and myfortic  (360 mg BID)  - Fluid restriction Confusion with non-focal neuro exam Improving. Hallucinations still ongoing, but continuing to improve.  - Continue dialysis  - Tacrolimus  level pending  Right foot pain Gout. Pain improving.   - Presnisone 50 mg daily (2/2 - 2/6) Contact dermatitis Benadryl  cream to affected areas PRN Chronic health problem CAD/SVC syndrome s/p stent -no DAPT needed while on DOAC per VVS as above HLD - change crestor  20mg  to Lipitor 40mg  for renal clearance GERD - famotidine  10 mg (renal dosing) Constipation - continue Miralax  and Senna daily     FEN/GI: Renal, 1200 mL fluid restriction  PPx: On Eliquis   Dispo: Tomorrow pending dialysis today and home health being set up.   Subjective:  Patient states she feels great this morning. She denies any hallucinations again. Her partner again says she is still seeing some things, but it is overall much improved. She denies chest pain and shortness of breath. She states her foot is no longer in pain.   Objective: Temp:  [97.4 F (36.3 C)-98.1 F (36.7 C)] 98.1 F (36.7 C) (02/05 0324) Pulse Rate:  [60-67] 63 (02/05 0324) Resp:  [13-19] 18 (02/05 0324) BP: (138-156)/(82-119) 155/117 (02/05 0324) SpO2:  [93 %-98 %] 97 % (02/05 0324) Physical Exam: General: awake, alert, NAD  Cardiovascular: RRR, no M/R/G  Respiratory: CTAB, normal work of breathing on room air  Abdomen: soft,  non-distended, non-tender to palpation  Extremities: +1 pitting edema in bilateral upper extremities to the elbows, +2 pitting edema in the feet bilaterally, +1 pitting edema in the lower extremities bilaterally up to the upper thigh   Laboratory: Most recent CBC Lab Results  Component Value Date   WBC 12.8 (H) 09/16/2024   HGB 9.0 (L) 09/16/2024   HCT  30.1 (L) 09/16/2024   MCV 81.4 09/16/2024   PLT 347 09/16/2024   Most recent BMP    Latest Ref Rng & Units 09/16/2024    6:14 AM  BMP  Glucose 70 - 99 mg/dL 879   BUN 6 - 20 mg/dL 49   Creatinine 9.55 - 1.00 mg/dL 5.25   Sodium 864 - 854 mmol/L 132   Potassium 3.5 - 5.1 mmol/L 5.0   Chloride 98 - 111 mmol/L 95   CO2 22 - 32 mmol/L 24   Calcium  8.9 - 10.3 mg/dL 8.4     Other pertinent labs: Uric acid 7.9   Imaging/Diagnostic Tests: None new.   Julia Raguel MATSU, DO 09/16/2024, 7:50 AM  PGY-1, [facility] Family Medicine FPTS Intern pager: [phone], text pages welcome Secure chat group [facility] Teaching Service   "

## 2024-09-16 NOTE — Assessment & Plan Note (Signed)
 Continue dialysis. TTS schedule.  Nephrology following, recommendations as below:  - Continue HD with UF  - continue aranesp  100 mcg weekly   - continue ferrous sulfate  325 every other day   - weaning off Myfortic  as it can cause neurologic abnormalities at high levels   - decreased Myfortic  from 3 mg BID to 1 mg BID  - tacrolimus  level pending  - Daily RFP, CBC, and Mag plan to replete electrolytes prn - Continue home prograf  (3 mg BID) and myfortic  (360 mg BID)  - Fluid restriction 

## 2024-09-16 NOTE — Assessment & Plan Note (Signed)
 Pulse remains stable. Continued elevated pressures, suspect to see continual improvement with HD.  Cardiology following, recommendation as below:  - Diuresis via dialysis - On Eliquis  5 mg twice daily   - Continue blood pressure regimen (clonidine  0.2 TID) Transition to metoprolol  succinate 50 mg today  - Transfusion threshold  <7 VVS recommendations  -Patient does not need ASA and Plavix  at discharge if on Eliquis  - CCM - Daily weights, Strict I/Os - Daily BMP, Mag (K>4, Mg> 2)  - Continue PT/OT  - Tylenol  650 mg q6hrs PRN

## 2024-09-16 NOTE — TOC Progression Note (Signed)
 Transition of Care (TOC) - Progression Note  Rayfield Gobble RN, BSN Inpatient Care Management Unit 4E- RN Case Manager See Treatment Team for direct phone #   Patient Details  Name: [name] MRN: [id number] Date of Birth: 1976-02-05  Transition of Care [facility]) CM/SW Contact  Gobble, Rayfield Hurst, RN Phone Number: 09/16/2024, 2:05 PM  Clinical Narrative:    Stopped by room to follow up on [facility] choice, pt sleeping, SO- Rose not at bedside.  TC made to [facility] to follow up.  Per conversation with Rumalda- she has not finished looking at list that was provided and states she does not really have preference- She is agreeable to CM sending out referrals in the Hub to see if any agency can service and follow up tomorrow.   Referrals sent out in the Hub- will follow up tomorrow.    Expected Discharge Plan: Home w Home Health Services Barriers to Discharge: Continued Medical Work up               Expected Discharge Plan and Services   Discharge Planning Services: CM Consult Post Acute Care Choice: Durable Medical Equipment, Home Health Living arrangements for the past 2 months: Single Family Home                 DME Arranged: N/A DME Agency: NA       HH Arranged: PT, OT           Social Drivers of Health (SDOH) Interventions SDOH Screenings   Food Insecurity: No Food Insecurity (09/09/2024)  Housing: Low Risk (09/09/2024)  Transportation Needs: No Transportation Needs (09/09/2024)  Utilities: Not At Risk (09/09/2024)  Tobacco Use: High Risk (09/13/2024)    Readmission Risk Interventions     No data to display

## 2024-09-16 NOTE — Progress Notes (Signed)
 [name] NEPHROLOGY PROGRESS NOTE  Assessment/ Plan: Pt is a 49 y.o. yo female  with past medical history significant for obesity, hypertension, anemia, dyslipidemia, SVC syndrome, ESRD status post LRKT in 2010 at [facility] presented with worsening shortness of breath, fluid overload, seen as a consultation for the management of new ESRD.   # New ESRD after failed kidney transplant: Complicate uremia and volume overload. -s/p LIJ TDC by IR on 1/29 and received first dialysis. -Renal navigator is following to arrange outpatient HD for ESRD, going to TCU. 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  Would need outpatient evaluation.  Hoping to do peritoneal dialysis. -Continue HD on TTS schedule - Start torsemide  100 mg daily to help with volume excess  Patient is okay for discharge from nephrology perspective.  Can start dialysis outpatient at the [facility].  Sounds like this is set up.   # Kidney transplant LRKT (mother) in 2010 at [facility]: Myfortic  weaned. Unclear cause of hallucinations but high tac level can cause neurological abnormalities. Decreased from 3mg  BID to 1mg  BID and f/u trough.  Continue to wean these medications in the outpatient setting   # Acute CHF exacerbation: Elevated BNP, UF with dialysis.  Cardiology is following.  Also started torsemide  daily.   # New onset A-fib with RVR: mgmt per primary and cardiology   # Anemia of CKD: Noted iron  saturation of 6%.  Ordered IV iron  and started Aranesp .  Also required blood transfusion.  Monitor lab.   # CKD-MBD:PTH 100, no therapy needed, hyperphosphatemia improved with dialysis.   # Hyponatremia, hypervolemia: UF with HD, fluid restriction and monitor lab.  Subjective: Continues to feel well.  No hallucinations.  Objective Vital signs in last 24 hours: Vitals:   09/15/24 2015 09/15/24 2304 09/16/24 0324 09/16/24 0853  BP: (!) 148/86 (!) 156/119  (!) 155/117 (!) 149/97  Pulse: 67 61 63 (!) 54  Resp: 13 14 18 18   Temp: 98 F (36.7 C) (!) 97.5 F (36.4 C) 98.1 F (36.7 C) 97.7 F (36.5 C)  TempSrc: Axillary Oral Oral Oral  SpO2: 94% 93% 97% 98%  Weight:      Height:       Weight change:   Intake/Output Summary (Last 24 hours) at 09/16/2024 1020 Last data filed at 09/16/2024 0854 Gross per 24 hour  Intake 600 ml  Output --  Net 600 ml       Labs: RENAL PANEL Recent Labs  Lab 09/12/24 0219 09/13/24 0155 09/14/24 0350 09/14/24 2043 09/15/24 1134 09/16/24 0614  NA 131* 132* 131* 131* 133* 132*  K 4.9 3.9 5.1 5.0 4.3 5.0  CL 97* 94* 95* 95* 94* 95*  CO2 19* 23 22 22 23 24   GLUCOSE 97 105* 137* 143* 117* 120*  BUN 61* 39* 52* 60* 37* 49*  CREATININE 5.12* 3.94* 4.96* 5.62* 4.11* 4.74*  CALCIUM  7.9* 7.6* 8.4* 8.7* 8.4* 8.4*  MG 2.3 2.0 2.2  --  2.0 2.1  PHOS 4.9* 4.0 5.1* 5.6* 4.6 4.6  ALBUMIN 2.0* 2.2* 2.4* 2.5* 2.4* 2.5*    Liver Function Tests: Recent Labs  Lab 09/14/24 2043 09/15/24 1134 09/16/24 0614  ALBUMIN 2.5* 2.4* 2.5*   No results for input(s): LIPASE, AMYLASE in the last 168 hours. No results for input(s): AMMONIA in the last 168 hours. CBC: Recent Labs    09/09/24 0309 09/10/24 0440 09/13/24 0155 09/14/24 0350 09/14/24 2043 09/15/24  1134 09/16/24 0614  HGB  --    < > 7.9* 9.2* 8.7* 9.3* 9.0*  MCV  --    < > 81.3 81.8 81.5 82.8 81.4  VITAMINB12 1,346*  --   --   --   --   --   --   FOLATE 9.4  --   --   --   --   --   --   FERRITIN 169  --   --   --   --   --   --   TIBC 239*  --   --   --   --   --   --   IRON  13*  --   --   --   --   --   --   RETICCTPCT 2.1  --   --   --   --   --   --    < > = values in this interval not displayed.    Cardiac Enzymes: No results for input(s): CKTOTAL, CKMB, CKMBINDEX, TROPONINI in the last 168 hours. CBG: Recent Labs  Lab 09/13/24 0945  GLUCAP 97    Iron  Studies:  No results for input(s): IRON , TIBC, TRANSFERRIN,  FERRITIN in the last 72 hours.  Studies/Results: No results found.   Medications: Infusions:    Scheduled Medications:  apixaban   5 mg Oral BID   atorvastatin   40 mg Oral Daily   Chlorhexidine  Gluconate Cloth  6 each Topical Q0600   Chlorhexidine  Gluconate Cloth  6 each Topical Q0600   cloNIDine   0.2 mg Oral TID   darbepoetin (ARANESP ) injection - DIALYSIS  100 mcg Subcutaneous Q Thu-1800   famotidine   10 mg Oral QHS   ferrous sulfate   325 mg Oral QODAY   lidocaine   1 patch Transdermal Q24H   metoprolol  succinate  50 mg Oral Daily   mycophenolate   360 mg Oral BID   nystatin  cream   Topical BID   polyethylene glycol  17 g Oral Daily   predniSONE   50 mg Oral Q breakfast   senna  1 tablet Oral Daily   tacrolimus   1 mg Oral BID   torsemide   100 mg Oral Daily   triamcinolone  cream  1 Application Topical TID   zolpidem   5 mg Oral QHS    have reviewed scheduled and prn medications.  Physical Exam: General:NAD, comfortable Heart: Normal rate no rub Lungs: Bilateral chest rise with no increased work of breathing  abdomen:soft, Non-tender, non-distended Extremities: 1+ edema in all 4 extremities  Dialysis Access: Left IJ TDC, no active bleed  [name] 09/16/2024,10:20 AM  LOS: 8 days

## 2024-09-16 NOTE — Assessment & Plan Note (Signed)
 Gout. Pain improving.   - Presnisone 50 mg daily (2/2 - 2/6)

## 2024-09-16 NOTE — Progress Notes (Addendum)
 Contacted TCU RN at [facility] to provide update on pt. Will coordinate start date at clinic once d/c date is confirmed. Will assist as needed.   Randine Mungo Dialysis Navigator [phone]  Addendum at 3:18 pm: Navigator advised pt is for possible d/c tomorrow. Contacted TCU RN to be made aware that pt will possibly start on Monday. Renal NP aware that clinic needs orders at d/c.

## 2024-09-16 NOTE — Assessment & Plan Note (Signed)
 Benadryl  cream to affected areas PRN

## 2024-09-16 NOTE — Progress Notes (Signed)
 Mobility Specialist Progress Note;   09/16/24 1008  Mobility  Activity Ambulated with assistance  Level of Assistance Contact guard assist, steadying assist  Assistive Device Front wheel walker  Distance Ambulated (ft) 50 ft  Activity Response Tolerated well  Mobility Referral Yes  Mobility visit 1 Mobility  Mobility Specialist Start Time (ACUTE ONLY) 1008  Mobility Specialist Stop Time (ACUTE ONLY) 1023  Mobility Specialist Time Calculation (min) (ACUTE ONLY) 15 min   Pt agreeable to mobility. Required no physical assistance for bed mobility, MinG for safety during ambulation. HR up to 70 bpm w/ activity. No c/o when asked. Pt returned back to bed and left with all needs met, alarm on.   Lauraine Erm Mobility Specialist Please contact via SecureChat or [employer] [phone]

## 2024-09-16 NOTE — Progress Notes (Signed)
 SPIRITUAL CARE AND COUNSELING CONSULT NOTE   VISIT SUMMARY: Chaplain met with Ms. Luke to review advance directive (AD). Chaplain provided education on required components for completion and confirmed the re remaining step include notarization and signature from two witness.   SPIRITUAL ENCOUNTER                                                                                                                                                                      Type of Visit: Initial Care provided to:: Family Reason for visit: Advance directives OnCall Visit: Yes   SPIRITUAL FRAMEWORK  Presenting Themes: Meaning/purpose/sources of inspiration, Impactful experiences and emotions, Caregiving needs Community/Connection: Family Patient Stress Factors: Health changes Family Stress Factors: Health changes   GOALS     Chaplain will coordinate with volunteer services and notary on tomorrow to assist with finalizing AD paperwork  INTERVENTIONS   Spiritual Care Interventions Made: Compassionate presence, Established relationship of care and support    INTERVENTION OUTCOMES   Outcomes: Awareness of health, Reduced anxiety  SPIRITUAL CARE PLAN     Chaplain remains available for continuing support and care as needed.   If immediate needs arise,    Christopher LITTIE Kiang, Elia  09/16/2024 8:54 PM

## 2024-09-16 NOTE — TOC Progression Note (Addendum)
 Transition of Care [facility]) - Progression Note    Patient Details  Name: [name] MRN: [id number] Date of Birth: 03-29-1976  [facility] CM/SW Contact  Waddell Barnie Rama, RN Phone Number: 09/16/2024, 3:21 PM  Clinical Narrative:    NCM offered choice from the accepting agencies, she chose [facility]. Soc will begin 24 to 48 hrs post dc.  Patient states she has a rollator and a bsc already in the home.  [place], so patient states she wants [facility].  NCM notified Cory with [facility].    Expected Discharge Plan: Home w Home Health Services Barriers to Discharge: Continued Medical Work up               Expected Discharge Plan and Services   Discharge Planning Services: CM Consult Post Acute Care Choice: Durable Medical Equipment, Home Health Living arrangements for the past 2 months: Single Family Home                 DME Arranged: N/A DME Agency: NA       HH Arranged: PT, OT           Social Drivers of Health (SDOH) Interventions SDOH Screenings   Food Insecurity: No Food Insecurity (09/09/2024)  Housing: Low Risk (09/09/2024)  Transportation Needs: No Transportation Needs (09/09/2024)  Utilities: Not At Risk (09/09/2024)  Tobacco Use: High Risk (09/13/2024)    Readmission Risk Interventions     No data to display

## 2024-09-16 NOTE — Progress Notes (Signed)
" °   09/16/24 2100  Pain Assessment  Pain Scale 0-10  Pain Score 0  Hemodialysis Catheter Left Internal jugular Double lumen Permanent (Tunneled)  Placement Date/Time: 09/09/24 1438   Serial / Lot #: [id number]  Expiration Date: 01/09/29  Time Out: Correct patient;Correct site;Correct procedure  Maximum sterile barrier precautions: Hand hygiene;Cap;Mask;Sterile gown;Sterile gloves;Large sterile s...  Site Condition No complications  Dressing Type Transparent  Dressing Status Antimicrobial disc/dressing in place  Neurological  Level of Consciousness Alert  Orientation Level Oriented X4  Respiratory  Respiratory Pattern Regular  Chest Assessment Chest expansion symmetrical  Bilateral Breath Sounds Clear  Psychosocial  Psychosocial (WDL) WDL   Dialysis treatment initiated. "

## 2024-09-16 NOTE — Discharge Summary (Shared)
 "  Family Medicine Teaching [facility] Discharge Summary  Patient name: [name] Medical record number: [id number] Date of birth: 15-Dec-1975 Age: 49 y.o. Gender: female Date of Admission: 09/08/2024  Date of Discharge: 09/17/2024 Admitting Physician: Laymon JINNY Legions, MD  Primary Care Provider: Cristopher Suzen HERO, NP Consultants: Nephrology, Cardiology, Vascular surgery, IR   Indication for Hospitalization: Paroxysmal a-fib   Discharge Diagnoses/Problem List:  Principal Problem for Admission: Paroxysmal A-fib/ESRD Other Problems addressed during stay:  Active Problems:   Chronic health problem   PAF (paroxysmal atrial fibrillation) (HCC)   Volume overload   Kidney transplant failure   IDA (iron  deficiency anemia)   Acute hypoxic respiratory failure (HCC)   Contact dermatitis   Constipation   Acute HFrEF (heart failure with reduced ejection fraction) (HCC)   Coronary artery calcification seen on CT scan   History of kidney transplant   Acute congestive heart failure (HCC)   Pressure injury of skin   Chronic renal failure, stage 5 (HCC)   ESRD (end stage renal disease) (HCC)   Confusion with non-focal neuro exam   Hypertensive emergency   Anasarca associated with disorder of kidney   Brief Hospital Course:  LYGIA [name] is a 49 y.o.female with history of R kidney transplant, CAD, HTN, HLD, GERD, anemia who was admitted to the [facility] Medicine Teaching Service at [facility] for volume overload 2/2 kidney transplant failure and new A-fib with RVR. Her hospital course is outlined below by problem:  A-Fib with RVR Volume overload Patient initially presented with worsening SOB and generalized edema. BP initially elevated to 173/140 and HR 107. EKG showing new A-fib. Labs significant pro-BNP > 35,000 and troponin 231. Patient was given duonebs x1, Lasix  40mg  x2 and started on a nitroglycerin  drip and BiPAP. Echo on 1/30 with evidence of newly reduced LVEF of 30-35%,  G2DD, right ventricular systolic function slightly reduced, RV size moderately enlarged, moderately elevated pulmonary arterial pressure.  Cardiology was consulted and recommended starting patient on heparin  drip for afib, considering discontinuing aspirin  on discharge given chronic anemia. Also recommended VVS consult for need of antiplatelet given SVC stent. Cardiology believed elevated troponin to be in the setting of her renal failure and not ischemic given no rise. Patient was also treated with dialysis for volume overload as well. Patient was transitioned to eliquis  5 mg BID on 2/1 and ASA and plavix  were discontinued as per VVS rec. On 2/6, patient was bradycardic down to 39. Metoprolol  was held. Cardiology adjusted dose and advised it be used PRN for A-fib on discharge. Patient's heart rate and rhythm remained stable at time of discharge.   Kidney transplant failure S/p R kidney transplant that is now failing. Cr 6.87 though unknown baseline. Recommended to initiate dialysis in December 2025 but declined at that time.  Nephrology consulted. Patient had TDC placed and started dialysis on 1/29. She was continued on prograf  and myfortic . Patient and her partner endorsing hallucinations. On 2/3, Nephrology weaned patient's Myfortic  from 3 mg BID to 1 mg BID as it can cause neurologic abnormalities at high levels. A level was obtained which was pending at time of discharge.   Anemia  Thought to be due to anemia of chronic disease in the setting of renal failure. Patient was given IV iron  and started on aranesp . Received 1 unit pRBC while in hospital. Hemoglobin remained stable at time of discharge.   Positive pregnancy test Patient with positive serum pregnancy test. Patient reported monogamous with a female partner so  obtained quantitative hCG which was also positive. Transvaginal ultra-sound showed no significant findings. Beta hcg was monitored and trended down decreasing likelihood of miscarriage or  ectopic pregnancy. Beta hcg thought to be sequelae of renal disease.  R foot swelling  Noted acute swelling and tenderness of R foot during admission. DVT US  negative. Thought to be gout flare, started on prednisone  course with improvement.   Other chronic conditions were medically managed with home medications and formulary alternatives as necessary (CAD (home plavix  was held due to anemia), HTN, HLD (rosuvastatin  was switched to lipitor 40 for renal clearance), GERD (famotidine  reduced to 10 mg for renal clearance))  PCP Follow-up recommendations: F/u CT chest once recovered from current presentation to evaluate mediastinal LAD Consider long term anticoagulation to prevent thrombosing of tunneled cath given prior SVC syndrome/central venous stenosis.  Per cardiology: with new reduction in EF, limited options for GDMT given renal failure. Repeat echo in 3 months as outpatient. If persistently reduced, then would pursue cath for ischemic workup as she is now on dialysis. Consider sleep study     Results/Tests Pending at Time of Discharge:  Unresulted Labs (From admission, onward)     Start     Ordered   09/15/24 0800  Tacrolimus  level  Once-Timed,   TIMED       Question:  Specimen collection method  Answer:  Lab=Lab collect   09/14/24 1047   09/10/24 0500  Renal function panel  Daily,   R     Question:  Specimen collection method  Answer:  Lab=Lab collect   09/09/24 1238   09/10/24 0500  Magnesium   Daily,   R     Question:  Specimen collection method  Answer:  Lab=Lab collect   09/09/24 1238             Disposition: Home with home health   Discharge Condition: medically stable for discharge   Discharge Exam:  Vitals:   09/17/24 1147 09/17/24 1409  BP: (!) 143/74 (!) 141/59  Pulse: (!) 46 (!) 49  Resp: 16 15  Temp: 97.8 F (36.6 C)   SpO2: 95% 96%   General: awake, alert, NAD  Cardiovascular: RRR, no M/R/G  Respiratory: CTAB, normal work of breathing on room air   Abdomen: soft, non-distended, non-tender to palpation  Extremities: +1 pitting edema in bilateral upper extremities to the elbows, +2 pitting edema in the feet bilaterally, +1 pitting edema in the lower extremities bilaterally up to the upper thigh   Significant Procedures: Tunnel catheter placed   Significant Labs and Imaging:  Recent Labs  Lab 09/16/24 0614 09/17/24 0334  WBC 12.8* 9.9  HGB 9.0* 9.4*  HCT 30.1* 31.8*  PLT 347 314   Recent Labs  Lab 09/16/24 0614 09/17/24 0334  NA 132* 131*  K 5.0 3.7  CL 95* 93*  CO2 24 24  GLUCOSE 120* 135*  BUN 49* 27*  CREATININE 4.74* 3.13*  CALCIUM  8.4* 8.2*  MG 2.1 1.8  PHOS 4.6 3.1  ALBUMIN 2.5* 2.9*    Pertinent Imaging  Chest x-ray: (09/08/24) 1. Mild pulmonary edema with bibasilar airspace opacities and small right pleural effusion, which may represent pneumonia. 2. Cardiomegaly.  CT chest: 1. Small left and moderate right pleural effusion with bilateral basilar consolidation, possibly representing compressive atelectasis and/or pneumonia. Focal infiltrates in the left lingula. 2. Cardiac enlargement with diffuse soft tissue edema, which can be seen with volume overload. 3. Mediastinal lymphadenopathy with pretracheal nodes measuring up to 1.7 cm short  axis dimension, nonspecific, possibly reactive or lymphoproliferative change. Follow-up after resolution of the acute process is recommended. 4. Scattered emphysematous changes in the lungs.  Transvaginal US : 1. No intrauterine pregnancy. In the setting of a positive beta HCG, differential considerations include an early pregnancy, complete miscarriage, or an unseen ectopic pregnancy. Obstetric consultation with serial beta hCG and sonographic follow-up should be obtained, as clinically warranted. 2. Cortical thinning and increased echogenicity of the right lower quadrant renal transplant, worrisome for chronic medical renal disease or rejection.  No hydronephrosis.  Chest x-ray: (09/10/24) 1. No complication after left internal jugular dialysis catheter placement. 2. Stable enlarged cardiac silhouette and bibasilar consolidation/effusions.  CT Head:  1. No acute intracranial abnormality. 2. Remote infarct in the right temporal lobe.  Discharge Medications:  Allergies as of 09/17/2024       Reactions   Metoclopramide Anxiety, Other (See Comments)   Ciprofloxacin Nausea Only   Morphine Other (See Comments)   Headache /migraine   Vancomycin    gives patient red man syndrome        Medication List     STOP taking these medications    aspirin  EC 81 MG tablet   clopidogrel  75 MG tablet Commonly known as: PLAVIX    torsemide  20 MG tablet Commonly known as: DEMADEX    Ventolin  HFA 108 (90 Base) MCG/ACT inhaler Generic drug: albuterol        TAKE these medications    apixaban  5 MG Tabs tablet Commonly known as: ELIQUIS  Take 1 tablet (5 mg total) by mouth 2 (two) times daily.   atorvastatin  40 MG tablet Commonly known as: LIPITOR Take 1 tablet (40 mg total) by mouth daily. Start taking on: September 18, 2024   cloNIDine  0.2 MG tablet Commonly known as: CATAPRES  Take 0.2 mg by mouth 3 (three) times daily.   Darbepoetin Alfa  100 MCG/0.5ML Sosy injection Commonly known as: ARANESP  Inject 0.5 mLs (100 mcg total) into the skin every Thursday at 6pm. Start taking on: September 23, 2024   famotidine  10 MG tablet Commonly known as: PEPCID  Take 1 tablet (10 mg total) by mouth at bedtime. What changed:  medication strength how much to take   ferrous sulfate  325 (65 FE) MG tablet Take 1 tablet (325 mg total) by mouth every other day. Start taking on: September 19, 2024   irbesartan  150 MG tablet Commonly known as: AVAPRO  Take 1 tablet (150 mg total) by mouth daily. Start taking on: September 18, 2024   Lidocaine  3 % Crea SMARTSIG:1 Gram(s) Topical Twice Daily PRN   metoprolol  succinate 25 MG 24 hr  tablet Commonly known as: Toprol  XL Take 1 tablet (25 mg total) by mouth as needed. Take as needed for Atrial Fibrillation   mycophenolate  360 MG Tbec EC tablet Commonly known as: MYFORTIC  Take 1 tablet (360 mg total) by mouth 2 (two) times daily. What changed: how much to take   nystatin  cream Commonly known as: MYCOSTATIN  Apply topically 2 (two) times daily.   ondansetron  4 MG disintegrating tablet Commonly known as: ZOFRAN -ODT Take 4 mg by mouth every 6 (six) hours as needed.   polyethylene glycol powder 17 GM/SCOOP powder Commonly known as: GLYCOLAX /MIRALAX  Take 17 g by mouth daily. Dissolve 1 capful (17g) in 4-8 ounces of liquid and take by mouth daily. Start taking on: September 18, 2024   rosuvastatin  20 MG tablet Commonly known as: CRESTOR  Take 20 mg by mouth daily.   senna 8.6 MG Tabs tablet Commonly known as: SENOKOT Take 1 tablet (  8.6 mg total) by mouth daily. Start taking on: September 18, 2024   tacrolimus  1 MG capsule Commonly known as: PROGRAF  Take 1 capsule (1 mg total) by mouth 2 (two) times daily. What changed: how much to take   triamcinolone  0.025 % cream Commonly known as: KENALOG  Apply 1 Application topically 2 (two) times daily.   zolpidem  10 MG tablet Commonly known as: AMBIEN  Take 10 mg by mouth at bedtime.   Zubsolv  8.6-2.1 MG Subl Generic drug: Buprenorphine  HCl-Naloxone  HCl Place 0.5-1 tablets under the tongue See admin instructions. Take 1 tablet, 1 tablet at midday, 0.5 tablet in the evening        Discharge Instructions: Please refer to Patient Instructions section of EMR for full details.  Patient was counseled important signs and symptoms that should prompt return to medical care, changes in medications, dietary instructions, activity restrictions, and follow up appointments.   Follow-Up Appointments:  Contact information for follow-up providers     Center, [facility]. Go on 09/20/2024.   Why: Transitional Care Unit- Monday,  Tuesday, Thursday, Friday with 1:00 pm start time.  On first day, please arrive at 12:15 pm to complete paperwork prior to treatment. Contact information: [address] [place] [address] [phone]              Contact information for after-discharge care     Home Medical Care     [facility]) .   Service: Home Health Services Contact information: [address] [place]  [address] [phone]        [facility]) .   Service: Home Health Services Why: Agency will call you to set up apt times Contact information: [address] [place]   [address] [phone]                     Ronnald Lee, DO 09/17/2024, 2:13 PM PGY-1, [facility] Family Medicine  I have reviewed the above note, agree with its content, and have made the appropriate changes.    Rea Raring, MD [facility] Family Medicine, PGY-2   "

## 2024-09-16 NOTE — Assessment & Plan Note (Signed)
 CAD/SVC syndrome s/p stent -no DAPT needed while on DOAC per VVS as above HLD - change crestor  20mg  to Lipitor 40mg  for renal clearance GERD - famotidine  10 mg (renal dosing) Constipation - continue Miralax  and Senna daily

## 2024-09-16 NOTE — Progress Notes (Signed)
 "  Rounding Note   Patient Name: [name] Date of Encounter: 09/16/2024  [facility] Cardiologist: None   Subjective Feeling much better. Confusion resolved.  A little short of breath with ambulation.   Scheduled Meds:  apixaban   5 mg Oral BID   atorvastatin   40 mg Oral Daily   Chlorhexidine  Gluconate Cloth  6 each Topical Q0600   Chlorhexidine  Gluconate Cloth  6 each Topical Q0600   cloNIDine   0.2 mg Oral TID   darbepoetin (ARANESP ) injection - DIALYSIS  100 mcg Subcutaneous Q Thu-1800   famotidine   10 mg Oral QHS   ferrous sulfate   325 mg Oral QODAY   lidocaine   1 patch Transdermal Q24H   [START ON 09/17/2024] metoprolol  succinate  25 mg Oral Daily   mycophenolate   360 mg Oral BID   nystatin  cream   Topical BID   polyethylene glycol  17 g Oral Daily   predniSONE   50 mg Oral Q breakfast   senna  1 tablet Oral Daily   tacrolimus   1 mg Oral BID   torsemide   100 mg Oral Daily   triamcinolone  cream  1 Application Topical TID   zolpidem   5 mg Oral QHS   Continuous Infusions:  PRN Meds: acetaminophen  **OR** [DISCONTINUED] acetaminophen , diphenhydrAMINE -zinc  acetate, lidocaine    Vital Signs  Vitals:   09/15/24 2015 09/15/24 2304 09/16/24 0324 09/16/24 0853  BP: (!) 148/86 (!) 156/119 (!) 155/117 (!) 149/97  Pulse: 67 61 63 (!) 54  Resp: 13 14 18 18   Temp: 98 F (36.7 C) (!) 97.5 F (36.4 C) 98.1 F (36.7 C) 97.7 F (36.5 C)  TempSrc: Axillary Oral Oral Oral  SpO2: 94% 93% 97% 98%  Weight:      Height:        Intake/Output Summary (Last 24 hours) at 09/16/2024 1151 Last data filed at 09/16/2024 0854 Gross per 24 hour  Intake 600 ml  Output --  Net 600 ml      09/15/2024    6:24 AM 09/14/2024    5:00 AM 09/13/2024    5:29 AM  Last 3 Weights  Weight (lbs) 201 lb 8 oz 193 lb 12.6 oz 191 lb 5.8 oz  Weight (kg) 91.4 kg 87.9 kg 86.8 kg      Telemetry Sinus rhythm Sinus bradycardia. - Personally Reviewed  ECG  N/a - Personally Reviewed  Physical  Exam  VS:  BP (!) 149/97 (BP Location: Left Arm)   Pulse (!) 54   Temp 97.7 F (36.5 C) (Oral)   Resp 18   Ht 5' 1.5 (1.562 m)   Wt 91.4 kg   SpO2 98%   BMI 37.46 kg/m  , BMI Body mass index is 37.46 kg/m. GENERAL:  Chronically ill-appearing.  Confused. Answers questions appropriately at times.  Active hallucinations.  HEENT: Pupils equal round and reactive, fundi not visualized, oral mucosa unremarkable NECK:  No jugular venous distention, waveform within normal limits, carotid upstroke brisk and symmetric, no bruits, no thyromegaly LUNGS:  Clear to auscultation bilaterally HEART:  RRR.  PMI not displaced or sustained,S1 and S2 within normal limits, no S3, no S4, no clicks, no rubs, no murmurs ABD:  Flat, positive bowel sounds normal in frequency in pitch, no bruits, no rebound, no guarding, no midline pulsatile mass, no hepatomegaly, no splenomegaly EXT:  2 plus pulses throughout, 2+ edema in upper extremities. 1+ LE edema.  No cyanosis no clubbing SKIN:  No rashes no nodules NEURO:  Cranial nerves II  through XII grossly intact, motor grossly intact throughout PSYCH:  Confused   Labs High Sensitivity Troponin:  No results for input(s): TROPONINIHS in the last 720 hours.  Recent Labs  Lab 09/08/24 1929 09/08/24 2218 09/09/24 0109  TRNPT 231* 232* 236*       Chemistry Recent Labs  Lab 09/14/24 0350 09/14/24 2043 09/15/24 1134 09/16/24 0614  NA 131* 131* 133* 132*  K 5.1 5.0 4.3 5.0  CL 95* 95* 94* 95*  CO2 22 22 23 24   GLUCOSE 137* 143* 117* 120*  BUN 52* 60* 37* 49*  CREATININE 4.96* 5.62* 4.11* 4.74*  CALCIUM  8.4* 8.7* 8.4* 8.4*  MG 2.2  --  2.0 2.1  ALBUMIN 2.4* 2.5* 2.4* 2.5*  GFRNONAA 10* 9* 13* 11*  ANIONGAP 14 14 17* 14    Lipids  Recent Labs  Lab 09/11/24 0727  CHOL 65  TRIG 80  HDL 29*  LDLCALC 20  CHOLHDL 2.3    Hematology Recent Labs  Lab 09/14/24 2043 09/15/24 1134 09/16/24 0614  WBC 10.3 11.6* 12.8*  RBC 3.57* 3.84* 3.70*  HGB  8.7* 9.3* 9.0*  HCT 29.1* 31.8* 30.1*  MCV 81.5 82.8 81.4  MCH 24.4* 24.2* 24.3*  MCHC 29.9* 29.2* 29.9*  RDW 18.7* 18.9* 19.4*  PLT 323 316 347   Thyroid  No results for input(s): TSH, FREET4 in the last 168 hours.   BNP No results for input(s): BNP, PROBNP in the last 168 hours.   DDimer No results for input(s): DDIMER in the last 168 hours.   Radiology  No results found.   Cardiac Studies Echo 10/03/22:  1. Left ventricular ejection fraction, by estimation, is 55 to 60%. The  left ventricle has normal function. The left ventricle has no regional  wall motion abnormalities. Left ventricular diastolic parameters are  consistent with Grade II diastolic  dysfunction (pseudonormalization).   2. Right ventricular systolic function is normal. The right ventricular  size is normal. There is normal pulmonary artery systolic pressure. The  estimated right ventricular systolic pressure is 32.2 mmHg.   3. Left atrial size was mildly dilated.   4. The mitral valve is normal in structure. Mild mitral valve  regurgitation. No evidence of mitral stenosis.   5. The aortic valve is normal in structure. Aortic valve regurgitation is  mild to moderate. No aortic stenosis is present.   6. The inferior vena cava is normal in size with greater than 50%  respiratory variability, suggesting right atrial pressure of 3 mmHg.   Echo 09/10/24:  1. Left ventricular ejection fraction, by estimation, is 30 to 35%. The  left ventricle has moderately decreased function. The left ventricle  demonstrates regional wall motion abnormalities (see scoring  diagram/findings for description). There is mild  concentric left ventricular hypertrophy. Left ventricular diastolic  parameters are consistent with Grade II diastolic dysfunction  (pseudonormalization). There is the interventricular septum is flattened  in diastole ('D' shaped left ventricle), consistent   with right ventricular volume  overload.   2. Right ventricular systolic function is mildly reduced. The right  ventricular size is moderately enlarged. There is moderately elevated  pulmonary artery systolic pressure.   3. Left atrial size was mildly dilated.   4. Right atrial size was moderately dilated.   5. Moderate pleural effusion.   6. The mitral valve is normal in structure. Mild mitral valve  regurgitation. No evidence of mitral stenosis.   7. Tricuspid valve regurgitation is moderate to severe.   8. The  aortic valve is tricuspid. Aortic valve regurgitation is moderate.  No aortic stenosis is present.   9. The inferior vena cava is dilated in size with <50% respiratory  variability, suggesting right atrial pressure of 15 mmHg.   Patient Profile   49 y.o. female  with ESRD s/p failed kidney transplant, hypertension, anemia of chronic disease, SVC syndrome s/p SVC stent, non-obstructive CAD admitted with volume overload in the setting of failed renal transplant, acute systolic heart failure and atrial fibrillation.   Assessment & Plan   # Acute HFrEF:  LVEF 30-35% down from 55% previously.   Volume status is improving with dialysis.  If LVEF remains reduced on 3 month echo, consider ischemic evaluation.  No contrast now given her renal dysfunction.  Will reduce metoprolol  due to bradycardia.  Recommend adding an ARB when nephrology feels comfortable.   # PAF:  Now maintaining sinus rhythm.  Given her systolic dysfunction, recommend trying to maintain sinus rhythm. Continue Eliquis  and reducing metoprolol  as above.   # Acute on chronic renal failure:  S/p renal transplant now admitted with failed transplant requiring HD, unfortunately.  She remains intermittently confused despite improvement in BUN.  Would like to use an ARB if/when cleared by nephrology.   # Hyperlipidemia:  Continue atorvastatin .   # Hypertension:  BP reasonably controlled on clonidine  and metoprolol . Recommend ARB if able if there is no  renal recovery.     For questions or updates, please contact [facility] Please consult [email or web address] for contact info under       Signed, Annabella Scarce, MD  09/16/2024, 11:51 AM    "

## 2024-09-16 NOTE — Assessment & Plan Note (Addendum)
 Improving. Hallucinations still ongoing, but continuing to improve.  - Continue dialysis  - Tacrolimus  level pending

## 2024-09-17 ENCOUNTER — Other Ambulatory Visit (HOSPITAL_COMMUNITY): Payer: Self-pay

## 2024-09-17 ENCOUNTER — Encounter (HOSPITAL_COMMUNITY): Payer: Self-pay | Admitting: Nephrology

## 2024-09-17 LAB — CBC
HCT: 31.8 % — ABNORMAL LOW (ref 36.0–46.0)
Hemoglobin: 9.4 g/dL — ABNORMAL LOW (ref 12.0–15.0)
MCH: 24.3 pg — ABNORMAL LOW (ref 26.0–34.0)
MCHC: 29.6 g/dL — ABNORMAL LOW (ref 30.0–36.0)
MCV: 82.2 fL (ref 80.0–100.0)
Platelets: 314 10*3/uL (ref 150–400)
RBC: 3.87 MIL/uL (ref 3.87–5.11)
RDW: 19.2 % — ABNORMAL HIGH (ref 11.5–15.5)
WBC: 9.9 10*3/uL (ref 4.0–10.5)
nRBC: 0 % (ref 0.0–0.2)

## 2024-09-17 LAB — RENAL FUNCTION PANEL
Albumin: 2.9 g/dL — ABNORMAL LOW (ref 3.5–5.0)
Anion gap: 14 (ref 5–15)
BUN: 27 mg/dL — ABNORMAL HIGH (ref 6–20)
CO2: 24 mmol/L (ref 22–32)
Calcium: 8.2 mg/dL — ABNORMAL LOW (ref 8.9–10.3)
Chloride: 93 mmol/L — ABNORMAL LOW (ref 98–111)
Creatinine, Ser: 3.13 mg/dL — ABNORMAL HIGH (ref 0.44–1.00)
GFR, Estimated: 18 mL/min — ABNORMAL LOW
Glucose, Bld: 135 mg/dL — ABNORMAL HIGH (ref 70–99)
Phosphorus: 3.1 mg/dL (ref 2.5–4.6)
Potassium: 3.7 mmol/L (ref 3.5–5.1)
Sodium: 131 mmol/L — ABNORMAL LOW (ref 135–145)

## 2024-09-17 LAB — TACROLIMUS LEVEL: Tacrolimus (FK506) - LabCorp: 22.6 ng/mL — ABNORMAL HIGH (ref 5.0–20.0)

## 2024-09-17 LAB — MAGNESIUM: Magnesium: 1.8 mg/dL (ref 1.7–2.4)

## 2024-09-17 MED ORDER — METOPROLOL SUCCINATE ER 25 MG PO TB24
25.0000 mg | ORAL_TABLET | ORAL | 11 refills | Status: AC | PRN
  Filled 2024-09-17: qty 30, 30d supply, fill #0

## 2024-09-17 MED ORDER — ATORVASTATIN CALCIUM 40 MG PO TABS
40.0000 mg | ORAL_TABLET | Freq: Every day | ORAL | 0 refills | Status: AC
  Filled 2024-09-17: qty 30, 30d supply, fill #0

## 2024-09-17 MED ORDER — APIXABAN 5 MG PO TABS
5.0000 mg | ORAL_TABLET | Freq: Two times a day (BID) | ORAL | 0 refills | Status: AC
  Filled 2024-09-17: qty 60, 30d supply, fill #0

## 2024-09-17 MED ORDER — HEPARIN SODIUM (PORCINE) 1000 UNIT/ML IJ SOLN
INTRAMUSCULAR | Status: AC
  Filled 2024-09-17: qty 4

## 2024-09-17 MED ORDER — FERROUS SULFATE 325 (65 FE) MG PO TABS
325.0000 mg | ORAL_TABLET | ORAL | 0 refills | Status: AC
  Filled 2024-09-17: qty 15, 30d supply, fill #0

## 2024-09-17 MED ORDER — IRBESARTAN 150 MG PO TABS
150.0000 mg | ORAL_TABLET | Freq: Every day | ORAL | 0 refills | Status: AC
  Filled 2024-09-17: qty 30, 30d supply, fill #0

## 2024-09-17 MED ORDER — MYCOPHENOLATE SODIUM 360 MG PO TBEC
360.0000 mg | DELAYED_RELEASE_TABLET | Freq: Two times a day (BID) | ORAL | 0 refills | Status: AC
  Filled 2024-09-17: qty 60, 30d supply, fill #0

## 2024-09-17 MED ORDER — IRBESARTAN 150 MG PO TABS: 150.0000 mg | ORAL_TABLET | Freq: Every day | ORAL | Status: AC

## 2024-09-17 MED ORDER — POLYETHYLENE GLYCOL 3350 17 GM/SCOOP PO POWD
17.0000 g | Freq: Every day | ORAL | 0 refills | Status: AC
  Filled 2024-09-17: qty 238, 14d supply, fill #0

## 2024-09-17 MED ORDER — TACROLIMUS 1 MG PO CAPS: 3.0000 mg | ORAL_CAPSULE | Freq: Two times a day (BID) | ORAL | Status: DC

## 2024-09-17 MED ORDER — TACROLIMUS 1 MG PO CAPS
1.0000 mg | ORAL_CAPSULE | Freq: Two times a day (BID) | ORAL | 0 refills | Status: AC
  Filled 2024-09-17: qty 60, 30d supply, fill #0

## 2024-09-17 MED ORDER — MYCOPHENOLATE SODIUM 360 MG PO TBEC: 360.0000 mg | DELAYED_RELEASE_TABLET | Freq: Two times a day (BID) | ORAL | 0 refills | Status: AC

## 2024-09-17 MED ORDER — FAMOTIDINE 10 MG PO TABS
10.0000 mg | ORAL_TABLET | Freq: Every day | ORAL | 0 refills | Status: AC
  Filled 2024-09-17: qty 30, 30d supply, fill #0

## 2024-09-17 MED ORDER — DARBEPOETIN ALFA 100 MCG/0.5ML IJ SOSY: 100.0000 ug | PREFILLED_SYRINGE | INTRAMUSCULAR | Status: AC

## 2024-09-17 MED ORDER — NYSTATIN 100000 UNIT/GM EX CREA
TOPICAL_CREAM | Freq: Two times a day (BID) | CUTANEOUS | 0 refills | Status: AC
  Filled 2024-09-17: qty 30, 30d supply, fill #0

## 2024-09-17 MED ORDER — SENNA 8.6 MG PO TABS
1.0000 | ORAL_TABLET | Freq: Every day | ORAL | 0 refills | Status: AC
  Filled 2024-09-17: qty 120, 120d supply, fill #0

## 2024-09-17 NOTE — Care Management Important Message (Signed)
 Important Message  Patient Details  Name: DAPHNE KARRER MRN: [id number] Date of Birth: [birth year]   Important Message Given:  Yes - Medicare IM     Vonzell Arrie Sharps 09/17/2024, 11:26 AM

## 2024-09-17 NOTE — Assessment & Plan Note (Addendum)
 Pulse remains stable. Continued elevated pressures, suspect to see continual improvement with HD.  Cardiology following, recommendation as below:  - Diuresis via dialysis - On Eliquis  5 mg twice daily   - Continue blood pressure regimen (clonidine  0.2 TID) decreased metoprolol  succinate to 25 mg    - Held metoprolol  today given low heart rate  - Transfusion threshold  <7 VVS recommendations  -Patient does not need ASA and Plavix  at discharge if on Eliquis  - CCM - Daily weights, Strict I/Os - Daily BMP, Mag (K>4, Mg> 2)  - Continue PT/OT  - Tylenol  650 mg q6hrs PRN

## 2024-09-17 NOTE — Progress Notes (Signed)
 "  Rounding Note   Patient Name: [name] Date of Encounter: 09/17/2024  [facility] Cardiologist: None   Subjective Feeling much better. Confusion resolved.  A little short of breath with ambulation.   Scheduled Meds:  apixaban   5 mg Oral BID   atorvastatin   40 mg Oral Daily   Chlorhexidine  Gluconate Cloth  6 each Topical Q0600   Chlorhexidine  Gluconate Cloth  6 each Topical Q0600   cloNIDine   0.2 mg Oral TID   darbepoetin (ARANESP ) injection - DIALYSIS  100 mcg Subcutaneous Q Thu-1800   famotidine   10 mg Oral QHS   ferrous sulfate   325 mg Oral QODAY   irbesartan   150 mg Oral Daily   lidocaine   1 patch Transdermal Q24H   mycophenolate   360 mg Oral BID   nystatin  cream   Topical BID   polyethylene glycol  17 g Oral Daily   senna  1 tablet Oral Daily   tacrolimus   1 mg Oral BID   triamcinolone  cream  1 Application Topical TID   zolpidem   5 mg Oral QHS   Continuous Infusions:  PRN Meds: acetaminophen  **OR** [DISCONTINUED] acetaminophen , diphenhydrAMINE -zinc  acetate, lidocaine    Vital Signs  Vitals:   09/17/24 0215 09/17/24 0312 09/17/24 0611 09/17/24 0804  BP: (!) 183/89   (!) 160/94  Pulse: (!) 43   (!) 58  Resp: 19 (P) 20  20  Temp: (!) 97 F (36.1 C) (!) (P) 97.4 F (36.3 C)  97.6 F (36.4 C)  TempSrc: Oral (P) Oral  Oral  SpO2: 100%   99%  Weight:   84 kg   Height:        Intake/Output Summary (Last 24 hours) at 09/17/2024 1127 Last data filed at 09/17/2024 0215 Gross per 24 hour  Intake 480 ml  Output 3500 ml  Net -3020 ml      09/17/2024    6:11 AM 09/15/2024    6:24 AM 09/14/2024    5:00 AM  Last 3 Weights  Weight (lbs) 185 lb 3 oz 201 lb 8 oz 193 lb 12.6 oz  Weight (kg) 84 kg 91.4 kg 87.9 kg      Telemetry Sinus rhythm Sinus bradycardia. - Personally Reviewed  ECG  N/a - Personally Reviewed  Physical Exam  VS:  BP (!) 160/94 (BP Location: Left Arm)   Pulse (!) 58   Temp 97.6 F (36.4 C) (Oral)   Resp 20   Ht 5' 1.5  (1.562 m)   Wt 84 kg   SpO2 99%   BMI 34.42 kg/m  , BMI Body mass index is 34.42 kg/m. GENERAL:  Chronically ill-appearing.  Confused. Answers questions appropriately at times.  Active hallucinations.  HEENT: Pupils equal round and reactive, fundi not visualized, oral mucosa unremarkable NECK:  No jugular venous distention, waveform within normal limits, carotid upstroke brisk and symmetric, no bruits, no thyromegaly LUNGS:  Clear to auscultation bilaterally HEART:  RRR.  PMI not displaced or sustained,S1 and S2 within normal limits, no S3, no S4, no clicks, no rubs, no murmurs ABD:  Flat, positive bowel sounds normal in frequency in pitch, no bruits, no rebound, no guarding, no midline pulsatile mass, no hepatomegaly, no splenomegaly EXT:  2 plus pulses throughout, 2+ edema in upper extremities. 1+ LE edema.  No cyanosis no clubbing SKIN:  No rashes no nodules NEURO:  Cranial nerves II through XII grossly intact, motor grossly intact throughout PSYCH:  Confused   Labs High Sensitivity Troponin:  No  results for input(s): TROPONINIHS in the last 720 hours.  Recent Labs  Lab 09/08/24 1929 09/08/24 2218 09/09/24 0109  TRNPT 231* 232* 236*       Chemistry Recent Labs  Lab 09/15/24 1134 09/16/24 0614 09/17/24 0334  NA 133* 132* 131*  K 4.3 5.0 3.7  CL 94* 95* 93*  CO2 23 24 24   GLUCOSE 117* 120* 135*  BUN 37* 49* 27*  CREATININE 4.11* 4.74* 3.13*  CALCIUM  8.4* 8.4* 8.2*  MG 2.0 2.1 1.8  ALBUMIN 2.4* 2.5* 2.9*  GFRNONAA 13* 11* 18*  ANIONGAP 17* 14 14    Lipids  Recent Labs  Lab 09/11/24 0727  CHOL 65  TRIG 80  HDL 29*  LDLCALC 20  CHOLHDL 2.3    Hematology Recent Labs  Lab 09/15/24 1134 09/16/24 0614 09/17/24 0334  WBC 11.6* 12.8* 9.9  RBC 3.84* 3.70* 3.87  HGB 9.3* 9.0* 9.4*  HCT 31.8* 30.1* 31.8*  MCV 82.8 81.4 82.2  MCH 24.2* 24.3* 24.3*  MCHC 29.2* 29.9* 29.6*  RDW 18.9* 19.4* 19.2*  PLT 316 347 314   Thyroid  No results for input(s): TSH,  FREET4 in the last 168 hours.   BNP No results for input(s): BNP, PROBNP in the last 168 hours.   DDimer No results for input(s): DDIMER in the last 168 hours.   Radiology  No results found.   Cardiac Studies Echo 10/03/22:  1. Left ventricular ejection fraction, by estimation, is 55 to 60%. The  left ventricle has normal function. The left ventricle has no regional  wall motion abnormalities. Left ventricular diastolic parameters are  consistent with Grade II diastolic  dysfunction (pseudonormalization).   2. Right ventricular systolic function is normal. The right ventricular  size is normal. There is normal pulmonary artery systolic pressure. The  estimated right ventricular systolic pressure is 32.2 mmHg.   3. Left atrial size was mildly dilated.   4. The mitral valve is normal in structure. Mild mitral valve  regurgitation. No evidence of mitral stenosis.   5. The aortic valve is normal in structure. Aortic valve regurgitation is  mild to moderate. No aortic stenosis is present.   6. The inferior vena cava is normal in size with greater than 50%  respiratory variability, suggesting right atrial pressure of 3 mmHg.   Echo 09/10/24:  1. Left ventricular ejection fraction, by estimation, is 30 to 35%. The  left ventricle has moderately decreased function. The left ventricle  demonstrates regional wall motion abnormalities (see scoring  diagram/findings for description). There is mild  concentric left ventricular hypertrophy. Left ventricular diastolic  parameters are consistent with Grade II diastolic dysfunction  (pseudonormalization). There is the interventricular septum is flattened  in diastole ('D' shaped left ventricle), consistent   with right ventricular volume overload.   2. Right ventricular systolic function is mildly reduced. The right  ventricular size is moderately enlarged. There is moderately elevated  pulmonary artery systolic pressure.   3. Left  atrial size was mildly dilated.   4. Right atrial size was moderately dilated.   5. Moderate pleural effusion.   6. The mitral valve is normal in structure. Mild mitral valve  regurgitation. No evidence of mitral stenosis.   7. Tricuspid valve regurgitation is moderate to severe.   8. The aortic valve is tricuspid. Aortic valve regurgitation is moderate.  No aortic stenosis is present.   9. The inferior vena cava is dilated in size with <50% respiratory  variability, suggesting right atrial pressure  of 15 mmHg.   Patient Profile   49 y.o. female  with ESRD s/p failed kidney transplant, hypertension, anemia of chronic disease, SVC syndrome s/p SVC stent, non-obstructive CAD admitted with volume overload in the setting of failed renal transplant, acute systolic heart failure and atrial fibrillation.   Assessment & Plan   # Acute HFrEF:  LVEF 30-35% down from 55% previously.   Volume status is improving with dialysis.  If LVEF remains reduced on 3 month echo, consider ischemic evaluation.  No contrast now given her renal dysfunction.  Will stop metoprolol  due to bradycardia. Discussed with Dr. Macel and she is OK ot add ARB.  Will start irbesartan  150mg  daily.   # PAF:  Now maintaining sinus rhythm.  Given her systolic dysfunction, recommend trying to maintain sinus rhythm. Continue Eliquis .  Hold metoprolol . She will take it as needed for atrial fibrillation after discharge.   # Bradycardia:  # OSA:  Patient reports that she was told she has OSA.  CPAP ordered but she didn't start b/c she didn't think she needed it.  Recommend outpatient sleep study.   # Acute on chronic renal failure:  S/p renal transplant now admitted with failed transplant requiring HD, unfortunately.  She remains intermittently confused despite improvement in BUN.  Would like to use an ARB if/when cleared by nephrology.   # Hyperlipidemia:  Continue atorvastatin .   # Hypertension:  BP reasonably uncontrolled on  clonidine  and metoprolol .  Adding irbesartan  as above.     For questions or updates, please contact [facility] Please consult [email or web address] for contact info under       Signed, Annabella Scarce, MD  09/17/2024, 11:27 AM    "

## 2024-09-17 NOTE — Progress Notes (Signed)
 [facility] NEPHROLOGY PROGRESS NOTE  Assessment/ Plan: Pt is a 49 y.o. yo female  with past medical history significant for obesity, hypertension, anemia, dyslipidemia, SVC syndrome, ESRD status post LRKT in 2010 at [facility] presented with worsening shortness of breath, fluid overload, seen as a consultation for the management of new ESRD.   # New ESRD after failed kidney transplant: Complicate uremia and volume overload. -s/p LIJ TDC by IR on 1/29 and received first dialysis. -Renal navigator is following to arrange outpatient HD for ESRD, going to TCU. 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  Would need outpatient evaluation.  Hoping to do peritoneal dialysis. - Had dialysis yesterday with no issues.  Can get dialysis again on Monday outpatient.  Not sure she can wait that long for next dialysis session.  Will try to get her dialyzed today prior to discharge.  If she is unable to do dialysis today will do it first thing in the morning. - No response to torsemide  so we can stop   # Kidney transplant LRKT (mother) in 2010 at [facility]: Myfortic  weaned. Unclear cause of hallucinations but high tac level can cause neurological abnormalities. Decreased from 3mg  BID to 1mg  BID and f/u trough.  Continue to wean these medications in the outpatient setting   # Acute CHF exacerbation: Elevated BNP, UF with dialysis.  Cardiology is following.  Also started torsemide  daily.   # New onset A-fib with RVR: mgmt per primary and cardiology   # Anemia of CKD: Noted iron  saturation of 6%.  Ordered IV iron  and started Aranesp .  Also required blood transfusion.  Monitor lab.   # CKD-MBD:PTH 100, no therapy needed, hyperphosphatemia improved with dialysis.   # Hyponatremia, hypervolemia: UF with HD, fluid restriction and monitor lab.  Subjective: Tolerated dialysis well yesterday.  No complaints.  Hoping to leave today but will  not be able to get dialysis outpatient until Monday.  Objective Vital signs in last 24 hours: Vitals:   09/17/24 0215 09/17/24 0312 09/17/24 0611 09/17/24 0804  BP: (!) 183/89   (!) 160/94  Pulse: (!) 43   (!) 58  Resp: 19 (P) 20  20  Temp: (!) 97 F (36.1 C) (!) (P) 97.4 F (36.3 C)  97.6 F (36.4 C)  TempSrc: Oral (P) Oral  Oral  SpO2: 100%   99%  Weight:   84 kg   Height:       Weight change:   Intake/Output Summary (Last 24 hours) at 09/17/2024 1012 Last data filed at 09/17/2024 0215 Gross per 24 hour  Intake 480 ml  Output 3500 ml  Net -3020 ml       Labs: RENAL PANEL Recent Labs  Lab 09/13/24 0155 09/14/24 0350 09/14/24 2043 09/15/24 1134 09/16/24 0614 09/17/24 0334  NA 132* 131* 131* 133* 132* 131*  K 3.9 5.1 5.0 4.3 5.0 3.7  CL 94* 95* 95* 94* 95* 93*  CO2 23 22 22 23 24 24   GLUCOSE 105* 137* 143* 117* 120* 135*  BUN 39* 52* 60* 37* 49* 27*  CREATININE 3.94* 4.96* 5.62* 4.11* 4.74* 3.13*  CALCIUM  7.6* 8.4* 8.7* 8.4* 8.4* 8.2*  MG 2.0 2.2  --  2.0 2.1 1.8  PHOS 4.0 5.1* 5.6* 4.6 4.6 3.1  ALBUMIN 2.2* 2.4* 2.5* 2.4* 2.5* 2.9*    Liver Function Tests: Recent Labs  Lab 09/15/24 1134 09/16/24 0614 09/17/24 0334  ALBUMIN 2.4*  2.5* 2.9*   No results for input(s): LIPASE, AMYLASE in the last 168 hours. No results for input(s): AMMONIA in the last 168 hours. CBC: Recent Labs    09/09/24 0309 09/10/24 0440 09/14/24 0350 09/14/24 2043 09/15/24 1134 09/16/24 0614 09/17/24 0334  HGB  --    < > 9.2* 8.7* 9.3* 9.0* 9.4*  MCV  --    < > 81.8 81.5 82.8 81.4 82.2  VITAMINB12 1,346*  --   --   --   --   --   --   FOLATE 9.4  --   --   --   --   --   --   FERRITIN 169  --   --   --   --   --   --   TIBC 239*  --   --   --   --   --   --   IRON  13*  --   --   --   --   --   --   RETICCTPCT 2.1  --   --   --   --   --   --    < > = values in this interval not displayed.    Cardiac Enzymes: No results for input(s): CKTOTAL, CKMB,  CKMBINDEX, TROPONINI in the last 168 hours. CBG: Recent Labs  Lab 09/13/24 0945  GLUCAP 97    Iron  Studies:  No results for input(s): IRON , TIBC, TRANSFERRIN, FERRITIN in the last 72 hours.  Studies/Results: No results found.   Medications: Infusions:    Scheduled Medications:  apixaban   5 mg Oral BID   atorvastatin   40 mg Oral Daily   Chlorhexidine  Gluconate Cloth  6 each Topical Q0600   Chlorhexidine  Gluconate Cloth  6 each Topical Q0600   cloNIDine   0.2 mg Oral TID   darbepoetin (ARANESP ) injection - DIALYSIS  100 mcg Subcutaneous Q Thu-1800   famotidine   10 mg Oral QHS   ferrous sulfate   325 mg Oral QODAY   lidocaine   1 patch Transdermal Q24H   metoprolol  succinate  25 mg Oral Daily   mycophenolate   360 mg Oral BID   nystatin  cream   Topical BID   polyethylene glycol  17 g Oral Daily   senna  1 tablet Oral Daily   tacrolimus   1 mg Oral BID   triamcinolone  cream  1 Application Topical TID   zolpidem   5 mg Oral QHS    have reviewed scheduled and prn medications.  Physical Exam: General:NAD, comfortable Heart: Normal rate no rub Lungs: Bilateral chest rise with no increased work of breathing  abdomen:soft, Non-tender, non-distended Extremities: 1+ edema in all 4 extremities  Dialysis Access: Left IJ TDC, no active bleed  [name] 09/17/2024,10:12 AM  LOS: 9 days

## 2024-09-17 NOTE — Assessment & Plan Note (Addendum)
 Continue dialysis. TTS schedule. 3.5 L out.  Nephrology following, recommendations as below:  - Continue HD with UF  - continue aranesp  100 mcg weekly   - continue ferrous sulfate  325 every other day   - weaning off Myfortic  as it can cause neurologic abnormalities at high levels   - decreased Myfortic  from 3 mg BID to 1 mg BID  - tacrolimus  level pending  - Daily RFP, CBC, and Mag plan to replete electrolytes prn - Continue home prograf  (3 mg BID) and myfortic  (360 mg BID)  - Fluid restriction 

## 2024-09-17 NOTE — Progress Notes (Signed)
" °   09/17/24 0215  Pain Assessment  Pain Scale 0-10  Pain Score 0  Hemodialysis Catheter Left Internal jugular Double lumen Permanent (Tunneled)  Placement Date/Time: 09/09/24 1438   Serial / Lot #: [id number]  Expiration Date: 01/09/29  Time Out: Correct patient;Correct site;Correct procedure  Maximum sterile barrier precautions: Hand hygiene;Cap;Mask;Sterile gown;Sterile gloves;Large sterile s...  Site Condition No complications  Dressing Type Transparent  Neurological  Level of Consciousness Alert  Orientation Level Oriented X4  Respiratory  Respiratory Pattern Regular  Chest Assessment Chest expansion symmetrical  Cardiac  Pulse Regular  Heart Sounds S1, S2  Psychosocial  Psychosocial (WDL) WDL  Patient Behaviors Appropriate for situation;Cooperative;Calm  Emotional support given Given to patient   Slow heart rate during treatment . Pt had no symptoms.                                                                                                                                                                "

## 2024-09-17 NOTE — Assessment & Plan Note (Addendum)
 Gout. Pain gone.   - Presnisone 50 mg daily (2/2 - 2/6)

## 2024-09-17 NOTE — Assessment & Plan Note (Addendum)
 Improving. Hallucinations still ongoing, but continuing to improve.  - Continue dialysis  - Tacrolimus  level pending

## 2024-09-17 NOTE — Progress Notes (Signed)
 Physical Therapy Treatment Patient Details Name: [name] MRN: [id number] DOB: 21-Oct-1975 Today's Date: 09/17/2024   History of Present Illness Pt is a 49 y.o. female who presented 09/08/24 with SOB and generalized peripheral edema. Pt admitted with volume overload and new A-fib with RVR. PMH: end-stage kidney disease s/p kidney transplant in 2010, HTN, SVC syndrome, HLD, anemia    PT Comments  Pt seated up on EOB on arrival, pleasant and eager for mobility. Pt progressing functional mobility independence as well as activity tolerance, progressing gait with rollator support with CGA for safety, for increased distance with no overt LOB noted. Pt demonstrating transfers sit<>stand without UE support with CGA for safety. Pt requiring cues throughout for safe brake use of rollator with fair carryover. Pt up in recliner at end of session with all needs met. Pt continues to benefit from skilled PT services to progress toward functional mobility goals.      If plan is discharge home, recommend the following: A little help with walking and/or transfers;A little help with bathing/dressing/bathroom;Assistance with cooking/housework;Assist for transportation;Help with stairs or ramp for entrance   Can travel by private vehicle        Equipment Recommendations  BSC/3in1;Rollator (4 wheels)    Recommendations for Other Services       Precautions / Restrictions Precautions Precautions: Fall;Other (comment) Precaution/Restrictions Comments: watch SpO2 Restrictions Weight Bearing Restrictions Per Provider Order: No     Mobility  Bed Mobility Overal bed mobility: Needs Assistance             General bed mobility comments: pt seated up EOB on arrival and up in recliner at end of session    Transfers Overall transfer level: Needs assistance Equipment used: None, Rollator (4 wheels) Transfers: Sit to/from Stand, Bed to chair/wheelchair/BSC Sit to Stand: Contact guard assist            General transfer comment: pt standing from EOB at lowest height with CGA for safety, able to maintain static standing without UE support prior to taking rollator for support    Ambulation/Gait Ambulation/Gait assistance: Contact guard assist Gait Distance (Feet): 150 Feet Assistive device: Rollator (4 wheels) Gait Pattern/deviations: Step-through pattern, Decreased step length - right, Decreased step length - left, Decreased stride length, Trunk flexed Gait velocity: decr     General Gait Details: slow but steady with rollator support, no LOB, increasing cadence with increased distance   Stairs             Wheelchair Mobility     Tilt Bed    Modified Rankin (Stroke Patients Only)       Balance Overall balance assessment: Needs assistance Sitting-balance support: Feet supported, Bilateral upper extremity supported, Single extremity supported Sitting balance-Leahy Scale: Fair Sitting balance - Comments: supervision for safety   Standing balance support: Single extremity supported, Bilateral upper extremity supported, During functional activity Standing balance-Leahy Scale: Poor Standing balance comment: reliant on UE support for dynamic tasks                            Communication Communication Communication: No apparent difficulties  Cognition Arousal: Alert Behavior During Therapy: WFL for tasks assessed/performed   PT - Cognitive impairments: No apparent impairments, Safety/Judgement                         Following commands: Intact      Cueing Cueing Techniques: Verbal cues  Exercises  General Comments General comments (skin integrity, edema, etc.): VSS on RA, spouse present throughout session and supportive      Pertinent Vitals/Pain Pain Assessment Pain Assessment: Faces Faces Pain Scale: Hurts a little bit Pain Location: R great toe Pain Descriptors / Indicators: Discomfort, Grimacing, Guarding Pain  Intervention(s): Monitored during session, Limited activity within patient's tolerance, Repositioned    Home Living                          Prior Function            PT Goals (current goals can now be found in the care plan section) Acute Rehab PT Goals Patient Stated Goal: to go home and take a shower PT Goal Formulation: With patient Time For Goal Achievement: 09/23/24 Progress towards PT goals: Progressing toward goals    Frequency    Min 2X/week      PT Plan      Co-evaluation              AM-PAC PT 6 Clicks Mobility   Outcome Measure  Help needed turning from your back to your side while in a flat bed without using bedrails?: A Little Help needed moving from lying on your back to sitting on the side of a flat bed without using bedrails?: A Little Help needed moving to and from a bed to a chair (including a wheelchair)?: A Little Help needed standing up from a chair using your arms (e.g., wheelchair or bedside chair)?: A Little Help needed to walk in hospital room?: A Little Help needed climbing 3-5 steps with a railing? : A Lot 6 Click Score: 17    End of Session Equipment Utilized During Treatment: Gait belt Activity Tolerance: Patient tolerated treatment well Patient left: in chair;with call bell/phone within reach;with nursing/sitter in room Nurse Communication: Mobility status PT Visit Diagnosis: Unsteadiness on feet (R26.81);Other abnormalities of gait and mobility (R26.89);Muscle weakness (generalized) (M62.81);Difficulty in walking, not elsewhere classified (R26.2)     Time: 8947-8880 PT Time Calculation (min) (ACUTE ONLY): 27 min  Charges:    $Gait Training: 8-22 mins $Therapeutic Activity: 8-22 mins PT General Charges $$ ACUTE PT VISIT: 1 Visit                     Emma-Lee Oddo R. PTA Acute Rehabilitation Services Office: [phone]   Therisa CHRISTELLA Boor 09/17/2024, 11:23 AM

## 2024-09-17 NOTE — Progress Notes (Signed)
" °   09/17/24 1723  Vitals  Temp 98 F (36.7 C)  BP 130/80  MAP (mmHg) 91  Pulse Rate (!) 44  ECG Heart Rate (!) 43  Resp 15  Weight 86.5 kg  Type of Weight Post-Dialysis  Oxygen Therapy  SpO2 95 %  O2 Device Room Air  Patient Activity (if Appropriate) In bed  Pulse Oximetry Type Continuous  During Treatment Monitoring  Blood Flow Rate (mL/min) 199 mL/min  Arterial Pressure (mmHg) -97.97 mmHg  Venous Pressure (mmHg) 96.36 mmHg  TMP (mmHg) 16.97 mmHg  Ultrafiltration Rate (mL/min) 1086 mL/min  Dialysate Flow Rate (mL/min) 299 ml/min  Dialysate Potassium Concentration 3  Dialysate Calcium  Concentration 2.5  Duration of HD Treatment -hour(s) 3 hour(s)  Cumulative Fluid Removed (mL) per Treatment  3000.18  Post Treatment  Dialyzer Clearance Lightly streaked  Liters Processed 72  Fluid Removed (mL) 3000 mL  Tolerated HD Treatment Yes  Hemodialysis Catheter Left Internal jugular Double lumen Permanent (Tunneled)  Placement Date/Time: 09/09/24 1438   Serial / Lot #: [id number]  Expiration Date: 01/09/29  Time Out: Correct patient;Correct site;Correct procedure  Maximum sterile barrier precautions: Hand hygiene;Cap;Mask;Sterile gown;Sterile gloves;Large sterile s...  Site Condition No complications  Blue Lumen Status Heparin  locked;Antimicrobial dead end cap  Red Lumen Status Heparin  locked;Antimicrobial dead end cap  Catheter fill solution Heparin  1000 units/ml  Catheter fill volume (Arterial) 1.9 cc  Catheter fill volume (Venous) 1.9  Dressing Type Transparent  Dressing Status Antimicrobial disc/dressing in place;Clean, Dry, Intact  Drainage Description None  Dressing Change Due 09/22/24  Post treatment catheter status Capped and Clamped    "

## 2024-09-17 NOTE — Progress Notes (Addendum)
 "    Daily Progress Note Intern Pager: [phone]  Patient name: [name] Medical record number: [id number] Date of birth: Dec 19, 1975 Age: 49 y.o. Gender: female  Primary Care Provider: Cristopher Suzen HERO, NP Consultants: Nephrology, cardiology, VVS (SO), IR (SO)  Code Status: DNR  Pt Overview and Major Events to Date:  1/28: admitted for new onset A-fib ISO of volume overload and ESRD with h/o renal transplant 1/29: TDC placed successfully by IR, HD started 1/30: CCM consulted for central line placement, unable to place, holding for now  Medical Decision Making:  [name] is a 49 y.o. female with a PMH of ESRD s/p L kidney transplant in 2010, chronic glomerulonephritis, HFpEF, SVC syndrome s/p successful recanalization complicated by SVC tear and cardiac tamponade in 2018 admitted for CHF exacerbation and new onset a-fib in the setting of volume overload.  Overnight patient was bradycardic to the upper 30s.  Will touch base with cardiology to discuss potential of decreasing metoprolol  dosing.  Will make recommendation to PCP for outpatient sleep study. Assessment & Plan PAF (paroxysmal atrial fibrillation) (HCC) Volume overload Acute HFrEF (heart failure with reduced ejection fraction) (HCC) Pulse remains stable. Continued elevated pressures, suspect to see continual improvement with HD.  Cardiology following, recommendation as below:  - Diuresis via dialysis - On Eliquis  5 mg twice daily   - Continue blood pressure regimen (clonidine  0.2 TID) decreased metoprolol  succinate to 25 mg    - Held metoprolol  today given low heart rate  - Transfusion threshold  <7 VVS recommendations  -Patient does not need ASA and Plavix  at discharge if on Eliquis  - CCM - Daily weights, Strict I/Os - Daily BMP, Mag (K>4, Mg> 2)  - Continue PT/OT  - Tylenol  650 mg q6hrs PRN Kidney transplant failure IDA (iron  deficiency anemia) Chronic renal failure, stage 5 (HCC) Continue  dialysis. TTS schedule. 3.5 L out.  Nephrology following, recommendations as below:  - Continue HD with UF  - continue aranesp  100 mcg weekly   - continue ferrous sulfate  325 every other day   - weaning off Myfortic  as it can cause neurologic abnormalities at high levels   - decreased Myfortic  from 3 mg BID to 1 mg BID  - tacrolimus  level pending  - Daily RFP, CBC, and Mag plan to replete electrolytes prn - Continue home prograf  (3 mg BID) and myfortic  (360 mg BID)  - Fluid restriction Confusion with non-focal neuro exam Improving. Hallucinations still ongoing, but continuing to improve.  - Continue dialysis  - Tacrolimus  level pending  Right foot pain (Resolved: 09/17/2024) Gout. Pain gone.   - Presnisone 50 mg daily (2/2 - 2/6) Contact dermatitis Benadryl  cream to affected areas PRN Chronic health problem CAD/SVC syndrome s/p stent -no DAPT needed while on DOAC per VVS as above HLD - change crestor  20mg  to Lipitor 40mg  for renal clearance GERD - famotidine  10 mg (renal dosing) Constipation - continue Miralax  and Senna daily   FEN/GI: Renal, 1200 mL fluid restriction  PPx: On Eliquis   Dispo: Today or tomorrow morning pending TCU availability and Nephrology recommendations for dialysis.   Subjective:  Patient is tired this morning from late dialysis session.  She states she returned to her room between 3 and 4 in the morning.  She is otherwise feeling well and eager to leave the hospital.  Objective: Temp:  [97 F (36.1 C)-98.3 F (36.8 C)] 97.6 F (36.4 C) (02/06 0804) Pulse Rate:  [41-61] 58 (02/06 0804) Resp:  [9-20]  20 (02/06 0804) BP: (144-186)/(70-129) 160/94 (02/06 0804) SpO2:  [95 %-100 %] 99 % (02/06 0804) Weight:  [84 kg] 84 kg (02/06 9388) Physical Exam: General: Awake, alert, NAD Cardiovascular: RRR, no M/R/G Respiratory: CTAB, normal work of breathing on room air Abdomen: Soft, nondistended, nontender to palpation Extremities: +1 pitting edema in  bilateral upper extremities to the elbows, +2 pitting edema in the feet bilaterally, +1 pitting edema in the lower extremities bilaterally up to the mid thigh.  Overall edema improving  Laboratory: Most recent CBC Lab Results  Component Value Date   WBC 9.9 09/17/2024   HGB 9.4 (L) 09/17/2024   HCT 31.8 (L) 09/17/2024   MCV 82.2 09/17/2024   PLT 314 09/17/2024   Most recent BMP    Latest Ref Rng & Units 09/17/2024    3:34 AM  BMP  Glucose 70 - 99 mg/dL 864   BUN 6 - 20 mg/dL 27   Creatinine 9.55 - 1.00 mg/dL 6.86   Sodium 864 - 854 mmol/L 131   Potassium 3.5 - 5.1 mmol/L 3.7   Chloride 98 - 111 mmol/L 93   CO2 22 - 32 mmol/L 24   Calcium  8.9 - 10.3 mg/dL 8.2     Imaging/Diagnostic Tests: EKG: Sinus bradycardia  Julia Raguel MATSU, DO 09/17/2024, 9:43 AM  PGY-1, [facility] FPTS Intern pager: [phone], text pages welcome Secure chat group [facility] Teaching Service   "

## 2024-09-17 NOTE — Progress Notes (Addendum)
 Pt for possible d/c later today vs tomorrow per staff. Pt's AVS updated with HD info. Contacted TCU RN to be made aware of pt's likely d/c and that pt should start on Monday. Renal NP aware clinic needs orders at d/c. Attempted to speak with pt's significant other via phone but provider meeting with pt. Will attempt to speak with pt and significant other later today. Will assist as needed.   [name] Dialysis Navigator [phone]  Addendum at 11:17 am: Spoke to pt via phone. Discussed HD info as a reminder and pt's significant other has info as well. Pt agreeable to plans.

## 2024-09-17 NOTE — Assessment & Plan Note (Addendum)
 CAD/SVC syndrome s/p stent -no DAPT needed while on DOAC per VVS as above HLD - change crestor  20mg  to Lipitor 40mg  for renal clearance GERD - famotidine  10 mg (renal dosing) Constipation - continue Miralax  and Senna daily

## 2024-09-17 NOTE — Assessment & Plan Note (Addendum)
 Benadryl  cream to affected areas PRN

## 2024-09-17 NOTE — Discharge Instructions (Addendum)
 Dear [name],  Thank you for letting us  participate in your care. You were hospitalized for volume overload and Atrial Fibrillation and diagnosed with A fib in the setting of volume overload, likely because of your failed kidney. You were treated with dialysis and new medications to help your heart. Please follow up with cardiology outpatient.   POST-HOSPITAL & CARE INSTRUCTIONS Go to your follow up appointments (listed below)   DOCTOR'S APPOINTMENT   Future Appointments  Date Time Provider Department Center  10/08/2024 10:55 AM West, Katlyn D, NP [facility]    Contact information for follow-up providers     Center, [facility]. Go on 09/20/2024.   Why: Transitional Care Unit- Monday, Tuesday, Thursday, Friday with 1:00 pm start time.  On first day, please arrive at 12:15 pm to complete paperwork prior to treatment. Contact information: [address] [place] [address] [phone]              Contact information for after-discharge care     Home Medical Care     [facility]) .   Service: Home Health Services Contact information: [address] [place]  [address] [phone]        [facility]) .   Service: Home Health Services Why: Agency will call you to set up apt times Contact information: [address] [place]  [address] [phone]                     Take care and be well!  Family Medicine Teaching Service Inpatient Team [place]  [facility]  [address] [place], [place] [address] [phone]

## 2024-09-17 NOTE — Progress Notes (Signed)
 [name] to be D/C'd Home per MD order.  Discussed with the patient and all questions fully answered.  VSS, Skin clean, dry and intact without evidence of skin break down, no evidence of skin tears noted. IV catheter discontinued intact. Site without signs and symptoms of complications. Dressing and pressure applied.  An After Visit Summary was printed and given to the patient. Patient received prescription.  D/c education completed with patient/family including follow up instructions, medication list, d/c activities limitations if indicated, with other d/c instructions as indicated by MD - patient able to verbalize understanding, all questions fully answered.   Patient instructed to return to ED, call 911, or call MD for any changes in condition.   Patient escorted via WC, and D/C home via private auto.  Rocky HERO Latissa Frick 09/17/2024 7:30 PM

## 2024-10-08 ENCOUNTER — Ambulatory Visit: Admitting: Cardiology
